# Patient Record
Sex: Female | Born: 1950 | Race: White | Hispanic: No | Smoking: Former smoker
Health system: Southern US, Community
[De-identification: ages and names within clinical notes are randomized; demographics above are authoritative.]

## PROBLEM LIST (undated history)

## (undated) DIAGNOSIS — K5790 Diverticulosis of intestine, part unspecified, without perforation or abscess without bleeding: Secondary | ICD-10-CM

## (undated) DIAGNOSIS — R0989 Other specified symptoms and signs involving the circulatory and respiratory systems: Secondary | ICD-10-CM

## (undated) DIAGNOSIS — I10 Essential (primary) hypertension: Secondary | ICD-10-CM

## (undated) DIAGNOSIS — I479 Paroxysmal tachycardia, unspecified: Secondary | ICD-10-CM

## (undated) DIAGNOSIS — E049 Nontoxic goiter, unspecified: Secondary | ICD-10-CM

## (undated) DIAGNOSIS — E785 Hyperlipidemia, unspecified: Secondary | ICD-10-CM

## (undated) DIAGNOSIS — T7840XA Allergy, unspecified, initial encounter: Secondary | ICD-10-CM

## (undated) HISTORY — DX: Nontoxic goiter, unspecified: E04.9

## (undated) HISTORY — DX: Other specified symptoms and signs involving the circulatory and respiratory systems: R09.89

## (undated) HISTORY — DX: Essential (primary) hypertension: I10

## (undated) HISTORY — DX: Diverticulosis of intestine, part unspecified, without perforation or abscess without bleeding: K57.90

## (undated) HISTORY — DX: Hyperlipidemia, unspecified: E78.5

## (undated) HISTORY — DX: Paroxysmal tachycardia, unspecified: I47.9

## (undated) HISTORY — DX: Allergy, unspecified, initial encounter: T78.40XA

## (undated) HISTORY — PX: COLONOSCOPY: SHX174

---

## 1973-06-25 HISTORY — PX: TONSILLECTOMY AND ADENOIDECTOMY: SHX28

## 1978-06-25 HISTORY — PX: ABDOMINAL HYSTERECTOMY: SHX81

## 1993-06-25 HISTORY — PX: CHOLECYSTECTOMY: SHX55

## 2004-12-22 ENCOUNTER — Ambulatory Visit: Payer: Self-pay | Admitting: *Deleted

## 2005-01-01 ENCOUNTER — Ambulatory Visit: Payer: Self-pay | Admitting: *Deleted

## 2005-01-03 ENCOUNTER — Ambulatory Visit: Payer: Self-pay | Admitting: Gastroenterology

## 2005-02-06 ENCOUNTER — Ambulatory Visit: Payer: Self-pay | Admitting: Gastroenterology

## 2006-07-31 ENCOUNTER — Ambulatory Visit: Payer: Self-pay | Admitting: *Deleted

## 2007-10-16 ENCOUNTER — Ambulatory Visit: Payer: Self-pay | Admitting: *Deleted

## 2009-02-08 ENCOUNTER — Ambulatory Visit: Payer: Self-pay | Admitting: Family Medicine

## 2009-05-10 ENCOUNTER — Encounter: Payer: Self-pay | Admitting: Family Medicine

## 2011-03-16 ENCOUNTER — Ambulatory Visit: Payer: Self-pay | Admitting: Family Medicine

## 2011-04-10 ENCOUNTER — Ambulatory Visit: Payer: Self-pay | Admitting: Family Medicine

## 2012-02-28 ENCOUNTER — Encounter: Payer: Self-pay | Admitting: Internal Medicine

## 2012-06-23 ENCOUNTER — Ambulatory Visit: Payer: Self-pay | Admitting: Family Medicine

## 2012-10-07 ENCOUNTER — Ambulatory Visit: Payer: Self-pay | Admitting: Family Medicine

## 2012-10-14 ENCOUNTER — Ambulatory Visit: Payer: Self-pay | Admitting: Otolaryngology

## 2012-10-16 ENCOUNTER — Encounter: Payer: Self-pay | Admitting: Internal Medicine

## 2012-12-16 ENCOUNTER — Encounter: Payer: Self-pay | Admitting: Internal Medicine

## 2012-12-16 ENCOUNTER — Ambulatory Visit (INDEPENDENT_AMBULATORY_CARE_PROVIDER_SITE_OTHER): Payer: BC Managed Care – PPO | Admitting: Internal Medicine

## 2012-12-16 VITALS — BP 140/100 | HR 73 | Temp 98.4°F | Ht 64.5 in | Wt 157.0 lb

## 2012-12-16 DIAGNOSIS — R221 Localized swelling, mass and lump, neck: Secondary | ICD-10-CM

## 2012-12-16 DIAGNOSIS — I10 Essential (primary) hypertension: Secondary | ICD-10-CM

## 2012-12-16 DIAGNOSIS — R220 Localized swelling, mass and lump, head: Secondary | ICD-10-CM

## 2012-12-16 DIAGNOSIS — E7800 Pure hypercholesterolemia, unspecified: Secondary | ICD-10-CM

## 2012-12-16 DIAGNOSIS — R55 Syncope and collapse: Secondary | ICD-10-CM

## 2012-12-16 MED ORDER — AMLODIPINE BESYLATE 5 MG PO TABS: 5.0000 mg | ORAL_TABLET | Freq: Every day | ORAL | Status: DC

## 2012-12-21 ENCOUNTER — Encounter: Payer: Self-pay | Admitting: Internal Medicine

## 2012-12-21 DIAGNOSIS — E049 Nontoxic goiter, unspecified: Secondary | ICD-10-CM | POA: Insufficient documentation

## 2012-12-21 DIAGNOSIS — R55 Syncope and collapse: Secondary | ICD-10-CM | POA: Insufficient documentation

## 2012-12-21 DIAGNOSIS — I10 Essential (primary) hypertension: Secondary | ICD-10-CM | POA: Insufficient documentation

## 2012-12-21 DIAGNOSIS — E7800 Pure hypercholesterolemia, unspecified: Secondary | ICD-10-CM | POA: Insufficient documentation

## 2012-12-21 NOTE — Assessment & Plan Note (Signed)
Followed by ENT and recently evaluated by Dr Solum.  Biopsy revealed a benign colloid nodule.  Follow.    

## 2012-12-21 NOTE — Assessment & Plan Note (Signed)
Off simvastatin.  Low cholesterol diet and exercise.  Follow lipid panel and liver function.   

## 2012-12-21 NOTE — Progress Notes (Signed)
  Subjective:    Patient ID: Cheyenne Baker, female    DOB: 12/10/1950, 62 y.o.   MRN: [id number]  HPI 62 year old female with past history of hypertension and hypercholesterolemia who comes in today to follow up on these issues as well as to establish care.  She has been followed at [facility] and by Dr Dear.  Blood pressure has been elevated.  Blood pressure on outside checks averaging 145-153/70-80s.  States that two months ago she passed out.  [place] Seat and hit her head.  Had CT scan and was apparently worked up and evaluated.  Was found to have a neck nodule.  Has seen Dr Gertie Baron and Dr Tedd Sias.  Biopsy revealed a benign colloid nodule.  Has no trouble swallowing.  Is planning to f/u with Dr Chestine Spore for a f/u ultrasound.  Has had some issues with hot flashes.     Past Medical History  Diagnosis Date  . Hypertension   . Hyperlipidemia     Outpatient Encounter Prescriptions as of 12/16/2012  Medication Sig Dispense Refill  . aspirin EC 81 MG tablet Take 81 mg by mouth daily.      Marland Kitchen atenolol (TENORMIN) 50 MG tablet Take 50 mg by mouth daily.      . Calcium Carbonate-Vitamin D (CALCIUM 600+D3) 600-400 MG-UNIT per tablet Take 1 tablet by mouth 2 (two) times daily.      . cetirizine (ZYRTEC) 10 MG tablet Take 10 mg by mouth daily.      Marland Kitchen losartan-hydrochlorothiazide (HYZAAR) 100-12.5 MG per tablet Take 1 tablet by mouth daily.      . [DISCONTINUED] NIFEdipine (PROCARDIA-XL/ADALAT CC) 30 MG 24 hr tablet Take 30 mg by mouth daily.      . [DISCONTINUED] simvastatin (ZOCOR) 40 MG tablet Take 40 mg by mouth every evening.      Marland Kitchen amLODipine (NORVASC) 5 MG tablet Take 1 tablet (5 mg total) by mouth daily.  30 tablet  3   No facility-administered encounter medications on file as of 12/16/2012.    Review of Systems Patient denies any headache, lightheadedness or dizziness.  No sinus or allergy symptoms.  No chest pain, tightness or palpitations.  No increased shortness of breath, cough or congestion.   No nausea or vomiting.  No acid reflux.  No abdominal pain or cramping.  No bowel change, such as diarrhea, constipation, BRBPR or melana.  No urine change.  Some hot flashes as outlined.       Objective:   Physical Exam Filed Vitals:   12/16/12 1519  BP: 140/100  Pulse: 73  Temp: 98.4 F (52.85 C)   62 year old female in no acute distress.   HEENT:  Nares- clear.  Oropharynx - without lesions. NECK:  Supple.  Nontender.  No audible bruit.  HEART:  Appears to be regular. LUNGS:  No crackles or wheezing audible.  Respirations even and unlabored.  RADIAL PULSE:  Equal bilaterally.  ABDOMEN:  Soft, nontender.  Bowel sounds present and normal.  No audible abdominal bruit.   EXTREMITIES:  No increased edema present.  DP pulses palpable and equal bilaterally.          Assessment & Plan:  HEALTH MAINTENANCE.  Schedule her for a physical when due.  Obtain records from outside for review.  Mammogram 2014 - ok.  Colonoscopy 02/06/05.  Recommended f/u seven years (per note).  Pt thought 10 year f/u recommended.

## 2012-12-21 NOTE — Assessment & Plan Note (Signed)
Previously worked up. Obtain records for review.  No reoccurrence.  Follow.

## 2012-12-21 NOTE — Assessment & Plan Note (Signed)
Blood pressure elevated as outlined.  Start norvasc 5mg  q day.  Follow pressures.  Get her back in soon to reassess. Obtain recent lab results.

## 2012-12-29 ENCOUNTER — Encounter: Payer: Self-pay | Admitting: Internal Medicine

## 2013-01-05 ENCOUNTER — Encounter: Payer: Self-pay | Admitting: Internal Medicine

## 2013-01-15 ENCOUNTER — Encounter: Payer: Self-pay | Admitting: Internal Medicine

## 2013-01-16 ENCOUNTER — Ambulatory Visit: Payer: BC Managed Care – PPO | Admitting: Internal Medicine

## 2013-02-02 ENCOUNTER — Encounter: Payer: Self-pay | Admitting: Family Medicine

## 2013-02-05 ENCOUNTER — Encounter: Payer: Self-pay | Admitting: Internal Medicine

## 2013-02-05 ENCOUNTER — Ambulatory Visit (INDEPENDENT_AMBULATORY_CARE_PROVIDER_SITE_OTHER): Payer: BC Managed Care – PPO | Admitting: Internal Medicine

## 2013-02-05 VITALS — BP 124/70 | HR 67 | Temp 98.0°F | Ht 64.5 in | Wt 153.0 lb

## 2013-02-05 DIAGNOSIS — R221 Localized swelling, mass and lump, neck: Secondary | ICD-10-CM

## 2013-02-05 DIAGNOSIS — I10 Essential (primary) hypertension: Secondary | ICD-10-CM

## 2013-02-05 DIAGNOSIS — R55 Syncope and collapse: Secondary | ICD-10-CM

## 2013-02-05 DIAGNOSIS — R220 Localized swelling, mass and lump, head: Secondary | ICD-10-CM

## 2013-02-05 DIAGNOSIS — E7800 Pure hypercholesterolemia, unspecified: Secondary | ICD-10-CM

## 2013-02-05 LAB — TSH: TSH: 1.46 u[IU]/mL (ref 0.35–5.50)

## 2013-02-05 LAB — COMPREHENSIVE METABOLIC PANEL
AST: 25 U/L (ref 0–37)
Alkaline Phosphatase: 41 U/L (ref 39–117)
Glucose, Bld: 106 mg/dL — ABNORMAL HIGH (ref 70–99)
Sodium: 140 mEq/L (ref 135–145)
Total Bilirubin: 0.6 mg/dL (ref 0.3–1.2)
Total Protein: 7.8 g/dL (ref 6.0–8.3)

## 2013-02-05 LAB — CBC WITH DIFFERENTIAL/PLATELET
Basophils Absolute: 0 10*3/uL (ref 0.0–0.1)
Eosinophils Absolute: 0.1 10*3/uL (ref 0.0–0.7)
HCT: 39.9 % (ref 36.0–46.0)
Lymphs Abs: 1.7 10*3/uL (ref 0.7–4.0)
MCHC: 34.4 g/dL (ref 30.0–36.0)
MCV: 91.7 fl (ref 78.0–100.0)
Monocytes Absolute: 0.4 10*3/uL (ref 0.1–1.0)
Platelets: 304 10*3/uL (ref 150.0–400.0)
RDW: 12.9 % (ref 11.5–14.6)

## 2013-02-05 LAB — LIPID PANEL
Cholesterol: 246 mg/dL — ABNORMAL HIGH (ref 0–200)
Triglycerides: 154 mg/dL — ABNORMAL HIGH (ref 0.0–149.0)

## 2013-02-06 ENCOUNTER — Encounter: Payer: Self-pay | Admitting: *Deleted

## 2013-02-06 ENCOUNTER — Other Ambulatory Visit: Payer: Self-pay | Admitting: Internal Medicine

## 2013-02-06 DIAGNOSIS — E876 Hypokalemia: Secondary | ICD-10-CM

## 2013-02-06 NOTE — Progress Notes (Signed)
Order placed for f/u potassium.  

## 2013-02-07 ENCOUNTER — Encounter: Payer: Self-pay | Admitting: Internal Medicine

## 2013-02-07 NOTE — Assessment & Plan Note (Signed)
Off simvastatin.  Low cholesterol diet and exercise.  Follow lipid panel and liver function.   

## 2013-02-07 NOTE — Assessment & Plan Note (Signed)
Previously worked up. Need records for review.  No reoccurrence.  Follow.

## 2013-02-07 NOTE — Assessment & Plan Note (Signed)
Followed by ENT and recently evaluated by Dr Tedd Sias.  Biopsy revealed a benign colloid nodule.  Follow.

## 2013-02-07 NOTE — Assessment & Plan Note (Signed)
Started norvasc 5mg  q day last visit.   Pressures much improved.    Follow metabolic panel.

## 2013-02-07 NOTE — Progress Notes (Signed)
  Subjective:    Patient ID: Cheyenne Baker, female    DOB: [birth year], 62 y.o.   MRN: [id number]  HPI 62 year old female with past history of hypertension and hypercholesterolemia who comes in today for a scheduled follow up.  Blood pressure had been elevated.   Also was previously found to have a neck nodule.  See last note for details.  Has seen Dr Gertie Baron and Dr Tedd Sias.  Biopsy revealed a benign colloid nodule.  Has no trouble swallowing.  Was started on norvasc last visit.  Tolerating.  Blood pressures on outside checks averaging 118-130s/60-70s.  Occasional 140s systolic readings.  Overall she feels good and feels she is doing well.       Past Medical History  Diagnosis Date  . Hypertension   . Hyperlipidemia     Outpatient Encounter Prescriptions as of 02/05/2013  Medication Sig Dispense Refill  . amLODipine (NORVASC) 5 MG tablet Take 1 tablet (5 mg total) by mouth daily.  30 tablet  3  . aspirin EC 81 MG tablet Take 81 mg by mouth daily.      Marland Kitchen atenolol (TENORMIN) 50 MG tablet Take 50 mg by mouth daily.      . Calcium Carbonate-Vitamin D (CALCIUM 600+D3) 600-400 MG-UNIT per tablet Take 1 tablet by mouth 2 (two) times daily.      . cetirizine (ZYRTEC) 10 MG tablet Take 10 mg by mouth daily.      Marland Kitchen losartan-hydrochlorothiazide (HYZAAR) 100-12.5 MG per tablet Take 1 tablet by mouth daily.       No facility-administered encounter medications on file as of 02/05/2013.    Review of Systems Patient denies any headache, lightheadedness or dizziness.  No sinus or allergy symptoms.  No chest pain, tightness or palpitations.  No increased shortness of breath, cough or congestion.  No nausea or vomiting.  No acid reflux.  No abdominal pain or cramping.  No bowel change, such as diarrhea, constipation, BRBPR or melana.  No urine change.  Blood pressures as outlined.      Objective:   Physical Exam  Filed Vitals:   02/05/13 1015  BP: 124/70  Pulse: 67  Temp: 98 F (36.7 C)   Blood  pressure recheck:  134-136/78, pulse 63  62 year old female in no acute distress.   HEENT:  Nares- clear.  Oropharynx - without lesions. NECK:  Supple.  Nontender.  No audible bruit.  HEART:  Appears to be regular. LUNGS:  No crackles or wheezing audible.  Respirations even and unlabored.  RADIAL PULSE:  Equal bilaterally.  ABDOMEN:  Soft, nontender.  Bowel sounds present and normal.  No audible abdominal bruit.   EXTREMITIES:  No increased edema present.  DP pulses palpable and equal bilaterally.          Assessment & Plan:  HEALTH MAINTENANCE.  Schedule her for a physical when due.  Mammogram 2014 - ok.  Colonoscopy 02/06/05.  Recommended f/u seven years (per note).  Pt thought 10 year f/u recommended.

## 2013-02-08 ENCOUNTER — Encounter: Payer: Self-pay | Admitting: Internal Medicine

## 2013-02-09 ENCOUNTER — Encounter: Payer: Self-pay | Admitting: Internal Medicine

## 2013-02-09 ENCOUNTER — Other Ambulatory Visit: Payer: Self-pay | Admitting: *Deleted

## 2013-02-09 MED ORDER — PRAVASTATIN SODIUM 10 MG PO TABS: 10.0000 mg | ORAL_TABLET | Freq: Every day | ORAL | Status: DC

## 2013-02-16 ENCOUNTER — Other Ambulatory Visit (INDEPENDENT_AMBULATORY_CARE_PROVIDER_SITE_OTHER): Payer: BC Managed Care – PPO

## 2013-02-16 DIAGNOSIS — E876 Hypokalemia: Secondary | ICD-10-CM

## 2013-02-17 ENCOUNTER — Encounter: Payer: Self-pay | Admitting: Internal Medicine

## 2013-02-17 ENCOUNTER — Telehealth: Payer: Self-pay | Admitting: Internal Medicine

## 2013-02-17 DIAGNOSIS — N289 Disorder of kidney and ureter, unspecified: Secondary | ICD-10-CM

## 2013-02-17 NOTE — Telephone Encounter (Signed)
Pt notified of lab results vai my chart.  Needs a non fasting lab in 10 days.  Please schedule and notify pt of appt date and time.  Thanks.

## 2013-02-17 NOTE — Telephone Encounter (Signed)
Left message on cell phone asking pt to call office °

## 2013-02-20 NOTE — Telephone Encounter (Signed)
Cheyenne Baker made appointment 9/5

## 2013-02-27 ENCOUNTER — Other Ambulatory Visit (INDEPENDENT_AMBULATORY_CARE_PROVIDER_SITE_OTHER): Payer: BC Managed Care – PPO

## 2013-02-27 DIAGNOSIS — N289 Disorder of kidney and ureter, unspecified: Secondary | ICD-10-CM

## 2013-02-27 LAB — BASIC METABOLIC PANEL
BUN: 19 mg/dL (ref 6–23)
Calcium: 10.5 mg/dL (ref 8.4–10.5)
Creatinine, Ser: 1.2 mg/dL (ref 0.4–1.2)
GFR: 49.91 mL/min — ABNORMAL LOW (ref >60.00)
Potassium: 3.7 mEq/L (ref 3.5–5.1)

## 2013-02-28 ENCOUNTER — Encounter: Payer: Self-pay | Admitting: Internal Medicine

## 2013-03-03 ENCOUNTER — Encounter: Payer: Self-pay | Admitting: Internal Medicine

## 2013-03-16 ENCOUNTER — Encounter: Payer: Self-pay | Admitting: Internal Medicine

## 2013-03-21 ENCOUNTER — Encounter: Payer: Self-pay | Admitting: Internal Medicine

## 2013-03-23 ENCOUNTER — Other Ambulatory Visit (INDEPENDENT_AMBULATORY_CARE_PROVIDER_SITE_OTHER): Payer: BC Managed Care – PPO

## 2013-03-23 ENCOUNTER — Other Ambulatory Visit: Payer: Self-pay | Admitting: Internal Medicine

## 2013-03-23 ENCOUNTER — Encounter: Payer: Self-pay | Admitting: Internal Medicine

## 2013-03-23 ENCOUNTER — Telehealth: Payer: Self-pay | Admitting: *Deleted

## 2013-03-23 DIAGNOSIS — Z1211 Encounter for screening for malignant neoplasm of colon: Secondary | ICD-10-CM

## 2013-03-23 DIAGNOSIS — E7800 Pure hypercholesterolemia, unspecified: Secondary | ICD-10-CM

## 2013-03-23 DIAGNOSIS — N289 Disorder of kidney and ureter, unspecified: Secondary | ICD-10-CM

## 2013-03-23 LAB — HEPATIC FUNCTION PANEL: Total Bilirubin: 0.5 mg/dL (ref 0.3–1.2)

## 2013-03-23 LAB — BASIC METABOLIC PANEL
CO2: 32 mEq/L (ref 19–32)
Chloride: 103 mEq/L (ref 96–112)
Glucose, Bld: 104 mg/dL — ABNORMAL HIGH (ref 70–99)
Potassium: 4.1 mEq/L (ref 3.5–5.1)
Sodium: 139 mEq/L (ref 135–145)

## 2013-03-23 NOTE — Telephone Encounter (Signed)
What labs and dx?  

## 2013-03-23 NOTE — Telephone Encounter (Signed)
Orders placed for labs

## 2013-03-23 NOTE — Progress Notes (Signed)
Order placed for IFOB 

## 2013-04-17 ENCOUNTER — Other Ambulatory Visit (INDEPENDENT_AMBULATORY_CARE_PROVIDER_SITE_OTHER): Payer: BC Managed Care – PPO

## 2013-04-17 ENCOUNTER — Other Ambulatory Visit: Payer: Self-pay | Admitting: *Deleted

## 2013-04-17 DIAGNOSIS — Z1211 Encounter for screening for malignant neoplasm of colon: Secondary | ICD-10-CM

## 2013-04-17 MED ORDER — AMLODIPINE BESYLATE 5 MG PO TABS: 5.0000 mg | ORAL_TABLET | Freq: Every day | ORAL | Status: DC

## 2013-04-20 ENCOUNTER — Encounter: Payer: Self-pay | Admitting: Internal Medicine

## 2013-04-30 ENCOUNTER — Other Ambulatory Visit: Payer: Self-pay

## 2013-05-16 ENCOUNTER — Encounter: Payer: Self-pay | Admitting: Internal Medicine

## 2013-06-04 ENCOUNTER — Encounter: Payer: Self-pay | Admitting: Internal Medicine

## 2013-06-11 ENCOUNTER — Ambulatory Visit (INDEPENDENT_AMBULATORY_CARE_PROVIDER_SITE_OTHER): Payer: BC Managed Care – PPO | Admitting: Internal Medicine

## 2013-06-11 ENCOUNTER — Encounter: Payer: Self-pay | Admitting: Internal Medicine

## 2013-06-11 ENCOUNTER — Other Ambulatory Visit: Payer: Self-pay | Admitting: Internal Medicine

## 2013-06-11 VITALS — BP 130/80 | HR 70 | Temp 97.6°F | Ht 64.0 in | Wt 158.5 lb

## 2013-06-11 DIAGNOSIS — J069 Acute upper respiratory infection, unspecified: Secondary | ICD-10-CM

## 2013-06-11 DIAGNOSIS — R55 Syncope and collapse: Secondary | ICD-10-CM

## 2013-06-11 DIAGNOSIS — R220 Localized swelling, mass and lump, head: Secondary | ICD-10-CM

## 2013-06-11 DIAGNOSIS — I10 Essential (primary) hypertension: Secondary | ICD-10-CM

## 2013-06-11 DIAGNOSIS — E7800 Pure hypercholesterolemia, unspecified: Secondary | ICD-10-CM

## 2013-06-11 DIAGNOSIS — R221 Localized swelling, mass and lump, neck: Secondary | ICD-10-CM

## 2013-06-11 LAB — BASIC METABOLIC PANEL
CO2: 30 mEq/L (ref 19–32)
Calcium: 10.1 mg/dL (ref 8.4–10.5)
Creatinine, Ser: 1 mg/dL (ref 0.4–1.2)
GFR: 58.5 mL/min — ABNORMAL LOW (ref >60.00)
Glucose, Bld: 94 mg/dL (ref 70–99)
Sodium: 140 mEq/L (ref 135–145)

## 2013-06-11 LAB — LIPID PANEL
Cholesterol: 201 mg/dL — ABNORMAL HIGH (ref 0–200)
HDL: 46.1 mg/dL (ref >39.00)
Total CHOL/HDL Ratio: 4
Triglycerides: 144 mg/dL (ref 0.0–149.0)
VLDL: 28.8 mg/dL (ref 0.0–40.0)

## 2013-06-11 LAB — HEPATIC FUNCTION PANEL
ALT: 31 U/L (ref 0–35)
AST: 25 U/L (ref 0–37)
Alkaline Phosphatase: 45 U/L (ref 39–117)
Total Bilirubin: 0.5 mg/dL (ref 0.3–1.2)

## 2013-06-11 LAB — LDL CHOLESTEROL, DIRECT: Direct LDL: 143.3 mg/dL

## 2013-06-11 NOTE — Progress Notes (Signed)
  Subjective:    Patient ID: Cheyenne Baker, female    DOB: [birth year], 62 y.o.   MRN: [id number]  HPI 62 year old female with past history of hypertension and hypercholesterolemia who comes in today to follow up on these issues as well as for a complete physical exam.   Blood pressure had been elevated.   Also was previously found to have a neck nodule.  See previous notes for details.  Has seen Dr Gertie Baron and Dr Tedd Sias.  Biopsy revealed a benign colloid nodule.  She feels the thyroid is "getting bigger".  Notices more fullness.  No pain.  Was started on norvasc at a previous visit - for her blood pressure.  Tolerating.  Blood pressures on outside checks averaging around 130 systolic range.  Doing better.  Overall she feels good and feels she has been doing well.  She has noticed (starting a week ago) some head congestion and cough.  Taking some otc cold medicine and this is helping.  No sob.      Past Medical History  Diagnosis Date  . Hypertension   . Hyperlipidemia     Outpatient Encounter Prescriptions as of 06/11/2013  Medication Sig  . amLODipine (NORVASC) 5 MG tablet Take 1 tablet (5 mg total) by mouth daily.  Marland Kitchen aspirin EC 81 MG tablet Take 81 mg by mouth daily.  Marland Kitchen atenolol (TENORMIN) 50 MG tablet Take 50 mg by mouth daily.  . Calcium Carbonate-Vitamin D (CALCIUM 600+D3) 600-400 MG-UNIT per tablet Take 1 tablet by mouth 2 (two) times daily.  . cetirizine (ZYRTEC) 10 MG tablet Take 10 mg by mouth daily.  Marland Kitchen losartan-hydrochlorothiazide (HYZAAR) 100-12.5 MG per tablet Take 1 tablet by mouth daily.  . pravastatin (PRAVACHOL) 10 MG tablet Take 1 tablet (10 mg total) by mouth daily.    Review of Systems Patient denies any headache, lightheadedness or dizziness.  Previous sore throat.  Some congestion.  No fever reported.  No chest pain, tightness or palpitations.  No increased shortness of breath.  Some cough.  No nausea or vomiting.  No acid reflux.  No abdominal pain or cramping.  No  bowel change, such as diarrhea, constipation, BRBPR or melana.  No urine change.  Blood pressures better.  Does report some increased neck fullness.       Objective:   Physical Exam  Filed Vitals:   06/11/13 1051  BP: 130/80  Pulse: 70  Temp: 97.6 F (36.4 C)   Blood pressure recheck:  138/78, pulse 60  62 year old female in no acute distress.   HEENT:  Nares- clear.  Oropharynx - without lesions. NECK:  Supple.  Nontender.  No audible bruit.  HEART:  Appears to be regular. LUNGS:  No crackles or wheezing audible.  Respirations even and unlabored.  RADIAL PULSE:  Equal bilaterally.    BREASTS:  No nipple discharge or nipple retraction present.  Could not appreciate any distinct nodules or axillary adenopathy.  ABDOMEN:  Soft, nontender.  Bowel sounds present and normal.  No audible abdominal bruit.  GU:  Not performed.     EXTREMITIES:  No increased edema present.  DP pulses palpable and equal bilaterally.          Assessment & Plan:  HEALTH MAINTENANCE.  Physical today.   Mammogram 06/23/12 Birads I.   Colonoscopy 02/06/05.  Recommended f/u seven years (per note).  Pt thought 10 year f/u recommended.  IFOB negative 04/17/13.

## 2013-06-11 NOTE — Progress Notes (Signed)
Pre-visit discussion using our clinic review tool. No additional management support is needed unless otherwise documented below in the visit note.  

## 2013-06-11 NOTE — Progress Notes (Signed)
Order placed for labs.

## 2013-06-12 ENCOUNTER — Telehealth: Payer: Self-pay | Admitting: Internal Medicine

## 2013-06-12 ENCOUNTER — Other Ambulatory Visit (INDEPENDENT_AMBULATORY_CARE_PROVIDER_SITE_OTHER): Payer: BC Managed Care – PPO

## 2013-06-12 ENCOUNTER — Encounter: Payer: Self-pay | Admitting: Internal Medicine

## 2013-06-12 ENCOUNTER — Other Ambulatory Visit: Payer: Self-pay | Admitting: Internal Medicine

## 2013-06-12 DIAGNOSIS — E041 Nontoxic single thyroid nodule: Secondary | ICD-10-CM

## 2013-06-12 DIAGNOSIS — J069 Acute upper respiratory infection, unspecified: Secondary | ICD-10-CM | POA: Insufficient documentation

## 2013-06-12 LAB — T4, FREE: Free T4: 0.73 ng/dL (ref 0.60–1.60)

## 2013-06-12 LAB — TSH: TSH: 1.66 u[IU]/mL (ref 0.35–5.50)

## 2013-06-12 MED ORDER — CEFUROXIME AXETIL 250 MG PO TABS: 250.0000 mg | ORAL_TABLET | Freq: Two times a day (BID) | ORAL | Status: DC

## 2013-06-12 MED ORDER — PRAVASTATIN SODIUM 20 MG PO TABS: 20.0000 mg | ORAL_TABLET | Freq: Every day | ORAL | Status: DC

## 2013-06-12 MED ORDER — FLUTICASONE PROPIONATE 50 MCG/ACT NA SUSP: 2.0000 | Freq: Every day | NASAL | Status: DC

## 2013-06-12 NOTE — Assessment & Plan Note (Signed)
On pravastatin.  Low cholesterol diet and exercise.  Follow lipid panel and liver function.  Recheck today.

## 2013-06-12 NOTE — Assessment & Plan Note (Signed)
Pressures much improved.    Follow metabolic panel.  Follow pressures.   

## 2013-06-12 NOTE — Progress Notes (Signed)
Orders placed for f/u thyroid labs.

## 2013-06-12 NOTE — Telephone Encounter (Signed)
Called pt.  Symptoms progressing.  Chest congestion.  Throat congestion.  Increased drainage.  Will treat with saline nasal spray - flush nose at least 2-3x/day.  Flonase nasal spray as directed.  Robitussin as directd.  Sent in rx for ceftin if symptoms progressed.  She will not take unless needed.  Will call if persistent problems.

## 2013-06-12 NOTE — Telephone Encounter (Signed)
Pt states she left msg on vm this morning.  States she was told at her appt yesterday Dr. Lorin Picket could call something in for her if she were feeling worse today. Is still coughing, feels it is settling in bottom of throat and into chest, headache.  States she would like to have something called in.  Cheyenne Baker.

## 2013-06-12 NOTE — Telephone Encounter (Signed)
Attempted to reach pt to discuss symptoms, had to leave message. Forwarded on to dr. Lorin Picket

## 2013-06-12 NOTE — Assessment & Plan Note (Signed)
Probably viral.  Symptoms improved with otc cold medicine.  Follow.

## 2013-06-12 NOTE — Assessment & Plan Note (Signed)
Followed by ENT and recently evaluated by Dr Tedd Sias.  Biopsy revealed a benign colloid nodule.  Now with increased fullness.  More symptomatic.  Will refer back to Dr Tedd Sias for reevaluation.

## 2013-06-12 NOTE — Assessment & Plan Note (Signed)
Previously worked up.  No reoccurrence.  Follow.   

## 2013-06-12 NOTE — Progress Notes (Signed)
Pravastatin rx sent in.

## 2013-06-13 ENCOUNTER — Encounter: Payer: Self-pay | Admitting: Internal Medicine

## 2013-07-16 ENCOUNTER — Other Ambulatory Visit: Payer: Self-pay | Admitting: Internal Medicine

## 2013-07-17 ENCOUNTER — Other Ambulatory Visit: Payer: Self-pay | Admitting: *Deleted

## 2013-07-17 MED ORDER — LOSARTAN POTASSIUM-HCTZ 100-12.5 MG PO TABS: 1.0000 | ORAL_TABLET | Freq: Every day | ORAL | Status: DC

## 2013-07-18 ENCOUNTER — Encounter: Payer: Self-pay | Admitting: Internal Medicine

## 2013-07-20 ENCOUNTER — Other Ambulatory Visit: Payer: Self-pay | Admitting: *Deleted

## 2013-07-20 MED ORDER — AMLODIPINE BESYLATE 5 MG PO TABS: ORAL_TABLET | ORAL | Status: DC

## 2013-07-20 MED ORDER — LOSARTAN POTASSIUM-HCTZ 100-12.5 MG PO TABS: 1.0000 | ORAL_TABLET | Freq: Every day | ORAL | Status: DC

## 2013-07-22 ENCOUNTER — Other Ambulatory Visit: Payer: Self-pay | Admitting: *Deleted

## 2013-07-23 ENCOUNTER — Ambulatory Visit: Payer: Self-pay | Admitting: Internal Medicine

## 2013-07-23 LAB — HM MAMMOGRAPHY: HM Mammogram: NEGATIVE

## 2013-07-24 ENCOUNTER — Other Ambulatory Visit: Payer: Self-pay | Admitting: *Deleted

## 2013-07-24 ENCOUNTER — Encounter: Payer: Self-pay | Admitting: Internal Medicine

## 2013-08-14 ENCOUNTER — Encounter: Payer: Self-pay | Admitting: *Deleted

## 2013-09-04 ENCOUNTER — Other Ambulatory Visit: Payer: Self-pay | Admitting: *Deleted

## 2013-09-04 MED ORDER — ATENOLOL 50 MG PO TABS: 50.0000 mg | ORAL_TABLET | Freq: Every day | ORAL | Status: DC

## 2013-09-22 ENCOUNTER — Telehealth: Payer: Self-pay | Admitting: Internal Medicine

## 2013-09-22 ENCOUNTER — Other Ambulatory Visit: Payer: Self-pay | Admitting: *Deleted

## 2013-09-22 MED ORDER — ATENOLOL 50 MG PO TABS: 50.0000 mg | ORAL_TABLET | Freq: Every day | ORAL | Status: DC

## 2013-09-22 NOTE — Telephone Encounter (Signed)
Pharmacy Note  Pt would like a 90 day supply

## 2013-09-22 NOTE — Telephone Encounter (Signed)
Pt calling to ask that Dr. Nicki Reaper let her pharmacy know that her atenolol needs to be 90 day supply.  States all her meds are 90 day supplies except this one and it needs to be 90 days.  States it is cheaper with her insurance if she does 90 day supply.

## 2013-09-22 NOTE — Telephone Encounter (Signed)
90 day supply already sent, see telephone encounter.

## 2013-09-22 NOTE — Telephone Encounter (Signed)
Resent Atenolol as 90 day supply.

## 2013-10-15 ENCOUNTER — Other Ambulatory Visit: Payer: Self-pay | Admitting: Internal Medicine

## 2013-10-15 ENCOUNTER — Ambulatory Visit (INDEPENDENT_AMBULATORY_CARE_PROVIDER_SITE_OTHER): Payer: Medicare Other | Admitting: Internal Medicine

## 2013-10-15 ENCOUNTER — Encounter: Payer: Self-pay | Admitting: Internal Medicine

## 2013-10-15 VITALS — BP 130/90 | HR 73 | Temp 98.1°F | Ht 64.0 in | Wt 159.5 lb

## 2013-10-15 DIAGNOSIS — E7800 Pure hypercholesterolemia, unspecified: Secondary | ICD-10-CM

## 2013-10-15 DIAGNOSIS — R220 Localized swelling, mass and lump, head: Secondary | ICD-10-CM

## 2013-10-15 DIAGNOSIS — R221 Localized swelling, mass and lump, neck: Secondary | ICD-10-CM

## 2013-10-15 DIAGNOSIS — R55 Syncope and collapse: Secondary | ICD-10-CM

## 2013-10-15 DIAGNOSIS — I10 Essential (primary) hypertension: Secondary | ICD-10-CM

## 2013-10-15 LAB — HEPATIC FUNCTION PANEL
ALBUMIN: 4 g/dL (ref 3.5–5.2)
ALK PHOS: 47 U/L (ref 39–117)
ALT: 31 U/L (ref 0–35)
AST: 28 U/L (ref 0–37)
BILIRUBIN TOTAL: 0.7 mg/dL (ref 0.3–1.2)
Bilirubin, Direct: 0.1 mg/dL (ref 0.0–0.3)
Total Protein: 7.2 g/dL (ref 6.0–8.3)

## 2013-10-15 LAB — LIPID PANEL
Cholesterol: 190 mg/dL (ref 0–200)
HDL: 52.3 mg/dL (ref >39.00)
LDL CALC: 117 mg/dL — AB (ref 0–99)
TRIGLYCERIDES: 104 mg/dL (ref 0.0–149.0)
Total CHOL/HDL Ratio: 4
VLDL: 20.8 mg/dL (ref 0.0–40.0)

## 2013-10-15 LAB — BASIC METABOLIC PANEL
BUN: 16 mg/dL (ref 6–23)
CO2: 30 mEq/L (ref 19–32)
Calcium: 11.2 mg/dL — ABNORMAL HIGH (ref 8.4–10.5)
Chloride: 105 mEq/L (ref 96–112)
Creatinine, Ser: 1 mg/dL (ref 0.4–1.2)
GFR: 61.23 mL/min (ref >60.00)
GLUCOSE: 101 mg/dL — AB (ref 70–99)
Potassium: 3.9 mEq/L (ref 3.5–5.1)
Sodium: 142 mEq/L (ref 135–145)

## 2013-10-15 NOTE — Assessment & Plan Note (Signed)
Pressures much improved.    Follow metabolic panel.  Follow pressures.

## 2013-10-15 NOTE — Assessment & Plan Note (Addendum)
Followed by ENT and recently evaluated by Dr Gabriel Carina.  Biopsy revealed a benign colloid nodule.  States has been released.  Feels things are stable.  Follow.

## 2013-10-15 NOTE — Progress Notes (Signed)
Pre visit review using our clinic review tool, if applicable. No additional management support is needed unless otherwise documented below in the visit note. 

## 2013-10-15 NOTE — Progress Notes (Signed)
  Subjective:    Patient ID: Cheyenne Baker, female    DOB: August 03, 1950, 63 y.o.   MRN: [id number]  HPI 63 year old female with past history of hypertension and hypercholesterolemia who comes in today for a scheduled follow up.  Blood pressure had been elevated.   Also was previously found to have a neck nodule.  See previous notes for details.  Has seen Dr Nadeen Landau and Dr Gabriel Carina.  Biopsy revealed a benign colloid nodule.  She feels the thyroid is stable now.  No change.  No pain.  Was started on norvasc at a previous visit - for her blood pressure.  Tolerating.  Blood pressures on outside checks averaging around 130/80.   Doing better.  Overall she feels good and feels she has been doing well.      Past Medical History  Diagnosis Date  . Hypertension   . Hyperlipidemia     Outpatient Encounter Prescriptions as of 10/15/2013  Medication Sig  . amLODipine (NORVASC) 5 MG tablet TAKE ONE (1) TABLET EACH DAY  . aspirin EC 81 MG tablet Take 81 mg by mouth daily.  Marland Kitchen atenolol (TENORMIN) 50 MG tablet Take 1 tablet (50 mg total) by mouth daily.  . Calcium Carbonate-Vitamin D (CALCIUM 600+D3) 600-400 MG-UNIT per tablet Take 1 tablet by mouth 2 (two) times daily.  . cetirizine (ZYRTEC) 10 MG tablet Take 10 mg by mouth daily.  Marland Kitchen losartan-hydrochlorothiazide (HYZAAR) 100-12.5 MG per tablet Take 1 tablet by mouth daily.  . pravastatin (PRAVACHOL) 20 MG tablet Take 1 tablet (20 mg total) by mouth daily.  . [DISCONTINUED] cefUROXime (CEFTIN) 250 MG tablet Take 1 tablet (250 mg total) by mouth 2 (two) times daily with a meal.  . [DISCONTINUED] fluticasone (FLONASE) 50 MCG/ACT nasal spray Place 2 sprays into both nostrils daily. Use this in the pm    Review of Systems Patient denies any headache, lightheadedness or dizziness.  No increased sinus congestion.   No chest pain, tightness or palpitations.  No increased shortness of breath.  No cough or chest congestion.  No nausea or vomiting.  No acid reflux.   No abdominal pain or cramping.  No bowel change, such as diarrhea, constipation, BRBPR or melana.  No urine change.  Blood pressures better.        Objective:   Physical Exam  Filed Vitals:   10/15/13 0934  BP: 130/90  Pulse: 73  Temp: 98.1 F (36.7 C)   Blood pressure recheck:  13/20  63 year old female in no acute distress.   HEENT:  Nares- clear.  Oropharynx - without lesions. NECK:  Supple.  Nontender.  No audible bruit.  Thyroid fullness noted (more on the right).   HEART:  Appears to be regular. LUNGS:  No crackles or wheezing audible.  Respirations even and unlabored.  RADIAL PULSE:  Equal bilaterally.  ABDOMEN:  Soft, nontender.  Bowel sounds present and normal.  No audible abdominal bruit.    EXTREMITIES:  No increased edema present.  DP pulses palpable and equal bilaterally.          Assessment & Plan:  HEALTH MAINTENANCE.  Physical 06/11/13.   Mammogram 07/14/13 - Birads I.   Colonoscopy 02/06/05.  Recommended f/u seven years (per note).  Pt thought 10 year f/u recommended.  IFOB negative 04/17/13.

## 2013-10-15 NOTE — Progress Notes (Signed)
Orders placed for f/u labs.  

## 2013-10-15 NOTE — Assessment & Plan Note (Signed)
Previously worked up.  No reoccurrence.  Follow.

## 2013-10-16 ENCOUNTER — Other Ambulatory Visit: Payer: Self-pay | Admitting: Internal Medicine

## 2013-10-16 NOTE — Progress Notes (Signed)
Opened in error

## 2013-10-18 ENCOUNTER — Encounter: Payer: Self-pay | Admitting: Internal Medicine

## 2013-10-18 NOTE — Assessment & Plan Note (Signed)
On pravastatin.  Low cholesterol diet and exercise.  Follow lipid panel and liver function.    

## 2013-10-19 ENCOUNTER — Other Ambulatory Visit: Payer: Medicare Other

## 2013-10-20 ENCOUNTER — Other Ambulatory Visit (INDEPENDENT_AMBULATORY_CARE_PROVIDER_SITE_OTHER): Payer: Medicare Other

## 2013-10-20 ENCOUNTER — Encounter: Payer: Self-pay | Admitting: Internal Medicine

## 2013-10-20 ENCOUNTER — Other Ambulatory Visit: Payer: Self-pay | Admitting: Internal Medicine

## 2013-10-20 LAB — CALCIUM: Calcium: 9.8 mg/dL (ref 8.4–10.5)

## 2013-10-21 ENCOUNTER — Encounter: Payer: Self-pay | Admitting: Internal Medicine

## 2013-10-21 ENCOUNTER — Other Ambulatory Visit: Payer: Self-pay | Admitting: Internal Medicine

## 2013-10-21 DIAGNOSIS — M109 Gout, unspecified: Secondary | ICD-10-CM

## 2013-10-21 LAB — PARATHYROID HORMONE, INTACT (NO CA): PTH: 66.8 pg/mL (ref 14.0–72.0)

## 2013-10-21 LAB — URIC ACID: URIC ACID, SERUM: 6 mg/dL (ref 2.4–7.0)

## 2013-10-21 LAB — CALCIUM, IONIZED: CALCIUM ION: 1.28 mmol/L (ref 1.12–1.32)

## 2013-10-21 NOTE — Progress Notes (Signed)
Order placed for uric acid test

## 2013-10-28 ENCOUNTER — Encounter: Payer: Self-pay | Admitting: Internal Medicine

## 2013-12-24 ENCOUNTER — Other Ambulatory Visit: Payer: Self-pay | Admitting: Internal Medicine

## 2014-01-13 ENCOUNTER — Other Ambulatory Visit: Payer: Self-pay | Admitting: Internal Medicine

## 2014-03-30 ENCOUNTER — Other Ambulatory Visit: Payer: Self-pay | Admitting: Internal Medicine

## 2014-07-15 ENCOUNTER — Other Ambulatory Visit: Payer: Self-pay | Admitting: Internal Medicine

## 2014-07-15 NOTE — Telephone Encounter (Signed)
Last OV 4.23.15, last refill 10.26.15.  Please advise refill

## 2014-07-15 NOTE — Telephone Encounter (Signed)
I will refill x 1.  She needs a f/u appt.

## 2014-07-21 ENCOUNTER — Other Ambulatory Visit: Payer: Self-pay | Admitting: *Deleted

## 2014-07-21 ENCOUNTER — Telehealth: Payer: Self-pay | Admitting: Internal Medicine

## 2014-07-21 MED ORDER — AMLODIPINE BESYLATE 5 MG PO TABS: ORAL_TABLET | ORAL | Status: DC

## 2014-07-21 NOTE — Telephone Encounter (Signed)
The soonest I could schedule the patient for her physical is 4.5.16. She will need medication refills before this date .

## 2014-09-28 ENCOUNTER — Ambulatory Visit
Admit: 2014-09-28 | Disposition: A | Discharge status: Home / Self Care | Payer: Self-pay | Attending: Internal Medicine | Admitting: Internal Medicine

## 2014-09-28 ENCOUNTER — Encounter: Payer: Self-pay | Admitting: Internal Medicine

## 2014-09-28 ENCOUNTER — Ambulatory Visit (INDEPENDENT_AMBULATORY_CARE_PROVIDER_SITE_OTHER): Payer: Medicare Other | Admitting: Internal Medicine

## 2014-09-28 VITALS — BP 122/90 | HR 68 | Temp 98.3°F | Ht 65.0 in | Wt 158.2 lb

## 2014-09-28 DIAGNOSIS — E7800 Pure hypercholesterolemia, unspecified: Secondary | ICD-10-CM

## 2014-09-28 DIAGNOSIS — L988 Other specified disorders of the skin and subcutaneous tissue: Secondary | ICD-10-CM

## 2014-09-28 DIAGNOSIS — I10 Essential (primary) hypertension: Secondary | ICD-10-CM

## 2014-09-28 DIAGNOSIS — R221 Localized swelling, mass and lump, neck: Secondary | ICD-10-CM | POA: Diagnosis not present

## 2014-09-28 DIAGNOSIS — Z23 Encounter for immunization: Secondary | ICD-10-CM | POA: Diagnosis not present

## 2014-09-28 DIAGNOSIS — Z1231 Encounter for screening mammogram for malignant neoplasm of breast: Secondary | ICD-10-CM | POA: Diagnosis not present

## 2014-09-28 DIAGNOSIS — Z1211 Encounter for screening for malignant neoplasm of colon: Secondary | ICD-10-CM

## 2014-09-28 DIAGNOSIS — M109 Gout, unspecified: Secondary | ICD-10-CM | POA: Diagnosis not present

## 2014-09-28 DIAGNOSIS — E041 Nontoxic single thyroid nodule: Secondary | ICD-10-CM

## 2014-09-28 DIAGNOSIS — Z0000 Encounter for general adult medical examination without abnormal findings: Secondary | ICD-10-CM

## 2014-09-28 DIAGNOSIS — K219 Gastro-esophageal reflux disease without esophagitis: Secondary | ICD-10-CM

## 2014-09-28 DIAGNOSIS — N649 Disorder of breast, unspecified: Secondary | ICD-10-CM

## 2014-09-28 DIAGNOSIS — Z9109 Other allergy status, other than to drugs and biological substances: Secondary | ICD-10-CM

## 2014-09-28 DIAGNOSIS — Z91048 Other nonmedicinal substance allergy status: Secondary | ICD-10-CM

## 2014-09-28 LAB — BASIC METABOLIC PANEL
BUN: 14 mg/dL (ref 6–23)
CALCIUM: 10.2 mg/dL (ref 8.4–10.5)
CO2: 32 mEq/L (ref 19–32)
CREATININE: 0.89 mg/dL (ref 0.40–1.20)
Chloride: 102 mEq/L (ref 96–112)
GFR: 67.42 mL/min (ref >60.00)
GLUCOSE: 88 mg/dL (ref 70–99)
Potassium: 4 mEq/L (ref 3.5–5.1)
Sodium: 140 mEq/L (ref 135–145)

## 2014-09-28 LAB — T3, FREE: T3 FREE: 3.3 pg/mL (ref 2.3–4.2)

## 2014-09-28 LAB — CBC WITH DIFFERENTIAL/PLATELET
Basophils Absolute: 0 10*3/uL (ref 0.0–0.1)
Basophils Relative: 0.3 % (ref 0.0–3.0)
Eosinophils Absolute: 0.1 10*3/uL (ref 0.0–0.7)
Eosinophils Relative: 1.5 % (ref 0.0–5.0)
HEMATOCRIT: 42.1 % (ref 36.0–46.0)
Hemoglobin: 14.5 g/dL (ref 12.0–15.0)
LYMPHS ABS: 1.9 10*3/uL (ref 0.7–4.0)
Lymphocytes Relative: 19.4 % (ref 12.0–46.0)
MCHC: 34.5 g/dL (ref 30.0–36.0)
MCV: 89.2 fl (ref 78.0–100.0)
MONOS PCT: 6.1 % (ref 3.0–12.0)
Monocytes Absolute: 0.6 10*3/uL (ref 0.1–1.0)
NEUTROS ABS: 7.3 10*3/uL (ref 1.4–7.7)
Neutrophils Relative %: 72.7 % (ref 43.0–77.0)
Platelets: 305 10*3/uL (ref 150.0–400.0)
RBC: 4.73 Mil/uL (ref 3.87–5.11)
RDW: 13.6 % (ref 11.5–15.5)
WBC: 10 10*3/uL (ref 4.0–10.5)

## 2014-09-28 LAB — LIPID PANEL
CHOL/HDL RATIO: 3
Cholesterol: 187 mg/dL (ref 0–200)
HDL: 55.7 mg/dL (ref >39.00)
LDL Cholesterol: 111 mg/dL — ABNORMAL HIGH (ref 0–99)
NonHDL: 131.3
Triglycerides: 103 mg/dL (ref 0.0–149.0)
VLDL: 20.6 mg/dL (ref 0.0–40.0)

## 2014-09-28 LAB — HEPATIC FUNCTION PANEL
ALT: 24 U/L (ref 0–35)
AST: 21 U/L (ref 0–37)
Albumin: 4.4 g/dL (ref 3.5–5.2)
Alkaline Phosphatase: 58 U/L (ref 39–117)
Bilirubin, Direct: 0.1 mg/dL (ref 0.0–0.3)
TOTAL PROTEIN: 7.5 g/dL (ref 6.0–8.3)
Total Bilirubin: 0.7 mg/dL (ref 0.2–1.2)

## 2014-09-28 LAB — TSH: TSH: 1.64 u[IU]/mL (ref 0.35–4.50)

## 2014-09-28 LAB — URIC ACID: URIC ACID, SERUM: 5.5 mg/dL (ref 2.4–7.0)

## 2014-09-28 LAB — HM MAMMOGRAPHY: HM MAMMO: NEGATIVE

## 2014-09-28 LAB — T4, FREE: FREE T4: 0.74 ng/dL (ref 0.60–1.60)

## 2014-09-28 MED ORDER — FEXOFENADINE HCL 180 MG PO TABS: 180.0000 mg | ORAL_TABLET | Freq: Every day | ORAL | Status: DC

## 2014-09-28 MED ORDER — ATENOLOL 50 MG PO TABS
ORAL_TABLET | ORAL | Status: DC
Start: 2014-09-28 — End: 2015-04-13

## 2014-09-28 MED ORDER — AMLODIPINE BESYLATE 5 MG PO TABS: ORAL_TABLET | ORAL | Status: DC

## 2014-09-28 MED ORDER — PRAVASTATIN SODIUM 20 MG PO TABS: ORAL_TABLET | ORAL | Status: DC

## 2014-09-28 MED ORDER — CETIRIZINE HCL 10 MG PO TABS
10.0000 mg | ORAL_TABLET | Freq: Every day | ORAL | Status: DC
Start: 2014-09-28 — End: 2014-09-28

## 2014-09-28 MED ORDER — LOSARTAN POTASSIUM-HCTZ 100-12.5 MG PO TABS: ORAL_TABLET | ORAL | Status: DC

## 2014-09-28 NOTE — Progress Notes (Signed)
Pre visit review using our clinic review tool, if applicable. No additional management support is needed unless otherwise documented below in the visit note. 

## 2014-09-28 NOTE — Progress Notes (Signed)
Patient ID: Cheyenne Baker, female   DOB: August 06, 1950, 64 y.o.   MRN: [id number]   Subjective:    Patient ID: Cheyenne Baker, female    DOB: 03-06-1951, 64 y.o.   MRN: [id number]  HPI  Patient here for her physical exam.  She has noticed some acid reflux recently.  No swallowing problems.  No cardiac symptoms with increased activity or exertion.  Breathing stable.  Some allergy symptoms.  Has been on zyrtec.  Discussed wanting to change to allegra.  She has noticed a persistent left breast lesion.  Bowels stable.  Last colonoscopy 01/2004-2006.  Due f/u colonoscopy.     Past Medical History  Diagnosis Date  . Hypertension   . Hyperlipidemia     Outpatient Encounter Prescriptions as of 09/28/2014  Medication Sig  . amLODipine (NORVASC) 5 MG tablet TAKE ONE (1) TABLET BY MOUTH EVERY DAY  . aspirin EC 81 MG tablet Take 81 mg by mouth daily.  Marland Kitchen atenolol (TENORMIN) 50 MG tablet TAKE ONE (1) TABLET BY MOUTH EVERY DAY  . losartan-hydrochlorothiazide (HYZAAR) 100-12.5 MG per tablet TAKE ONE (1) TABLET EACH DAY  . pravastatin (PRAVACHOL) 20 MG tablet TAKE ONE (1) TABLET EACH DAY  . [DISCONTINUED] amLODipine (NORVASC) 5 MG tablet TAKE ONE (1) TABLET BY MOUTH EVERY DAY  . [DISCONTINUED] atenolol (TENORMIN) 50 MG tablet TAKE ONE (1) TABLET BY MOUTH EVERY DAY  . [DISCONTINUED] Calcium Carbonate-Vitamin D (CALCIUM 600+D3) 600-400 MG-UNIT per tablet Take 1 tablet by mouth 2 (two) times daily.  . [DISCONTINUED] cetirizine (ZYRTEC) 10 MG tablet Take 10 mg by mouth daily.  . [DISCONTINUED] cetirizine (ZYRTEC) 10 MG tablet Take 1 tablet (10 mg total) by mouth daily.  . [DISCONTINUED] losartan-hydrochlorothiazide (HYZAAR) 100-12.5 MG per tablet TAKE ONE (1) TABLET EACH DAY  . [DISCONTINUED] pravastatin (PRAVACHOL) 20 MG tablet TAKE ONE (1) TABLET EACH DAY  . fexofenadine (ALLEGRA ALLERGY) 180 MG tablet Take 1 tablet (180 mg total) by mouth daily.    Review of Systems  Constitutional: Negative for appetite  change and unexpected weight change.  HENT: Negative for congestion and sinus pressure.   Eyes: Negative for pain and visual disturbance.  Respiratory: Negative for cough, chest tightness and shortness of breath.   Cardiovascular: Negative for chest pain, palpitations and leg swelling.  Gastrointestinal: Negative for nausea, vomiting, abdominal pain and diarrhea.       Acid reflux as outlined.    Genitourinary: Negative for dysuria and difficulty urinating.  Musculoskeletal: Negative for back pain and joint swelling.  Skin: Negative for color change and rash.  Neurological: Negative for dizziness, light-headedness and headaches.  Hematological: Negative for adenopathy. Does not bruise/bleed easily.  Psychiatric/Behavioral: Negative for dysphoric mood and agitation.       Objective:     Blood pressure recheck:  142/78-80  Physical Exam  Constitutional: She is oriented to person, place, and time. She appears well-developed and well-nourished.  HENT:  Nose: Nose normal.  Mouth/Throat: Oropharynx is clear and moist.  Eyes: Right eye exhibits no discharge. Left eye exhibits no discharge. No scleral icterus.  Neck: Neck supple. No thyromegaly present.  Cardiovascular: Normal rate and regular rhythm.   Pulmonary/Chest: Breath sounds normal. No accessory muscle usage. No tachypnea. No respiratory distress. She has no decreased breath sounds. She has no wheezes. She has no rhonchi. Right breast exhibits no inverted nipple, no mass, no nipple discharge and no tenderness (no axillary adenopathy). Left breast exhibits no inverted nipple, no mass, no nipple discharge and  no tenderness (no axilarry adenopathy).  Abdominal: Soft. Bowel sounds are normal. There is no tenderness.  Musculoskeletal: She exhibits no edema or tenderness.  Lymphadenopathy:    She has no cervical adenopathy.  Neurological: She is alert and oriented to person, place, and time.  Skin: Skin is warm. No rash noted.    Psychiatric: She has a normal mood and affect. Her behavior is normal.    BP 122/90 mmHg  Pulse 68  Temp(Src) 98.3 F (36.8 C) (Oral)  Ht 5\' 5"  (1.651 m)  Wt 158 lb 4 oz (71.782 kg)  BMI 26.33 kg/m2  SpO2 95% Wt Readings from Last 3 Encounters:  09/28/14 158 lb 4 oz (71.782 kg)  10/15/13 159 lb 8 oz (72.349 kg)  06/11/13 158 lb 8 oz (71.895 kg)     Lab Results  Component Value Date   WBC 10.0 09/28/2014   HGB 14.5 09/28/2014   HCT 42.1 09/28/2014   PLT 305.0 09/28/2014   GLUCOSE 88 09/28/2014   CHOL 187 09/28/2014   TRIG 103.0 09/28/2014   HDL 55.70 09/28/2014   LDLDIRECT 143.3 06/11/2013   LDLCALC 111* 09/28/2014   ALT 24 09/28/2014   AST 21 09/28/2014   NA 140 09/28/2014   K 4.0 09/28/2014   CL 102 09/28/2014   CREATININE 0.89 09/28/2014   BUN 14 09/28/2014   CO2 32 09/28/2014   TSH 1.64 09/28/2014       Assessment & Plan:   Problem List Items Addressed This Visit    Environmental allergies    Will stop zyrtec.  Start allegra as directed.  Follow.       Essential hypertension, benign - Primary    Blood pressure as outlined.  Continue same medication regimen.  Follow pressures.  Get her back in soon to reassess.  Follow metabolic panel.        Relevant Medications   amLODIpine (NORVASC) tablet   atenolol (TENORMIN) tablet   losartan-hydrochlorothiazide (HYZAAR) 100-12.5 MG per tablet   pravastatin (PRAVACHOL) tablet   Other Relevant Orders   Basic metabolic panel (Completed)   GERD (gastroesophageal reflux disease)    Reflux as outlined.  Start zantac 150mg  q day.  Get her back in soon to reassess.        Health care maintenance    Physical today.  She is scheduled today for a mammogram.  Colonoscopy 01/2004-2006.  Due follow up colonoscopy.  Refer to GI.        Hypercholesterolemia    Low cholesterol diet and exercise.  On pravastatin.  Follow lipid panel and liver function tests.       Relevant Medications   amLODIpine (NORVASC) tablet    atenolol (TENORMIN) tablet   losartan-hydrochlorothiazide (HYZAAR) 100-12.5 MG per tablet   pravastatin (PRAVACHOL) tablet   Other Relevant Orders   Lipid panel (Completed)   Hepatic function panel (Completed)   Neck nodule    Followed by ENT and Dr Gabriel Carina.  Biopsy revealed a benign colloid nodule.  Has been released.  Check thyroid function tests.        Relevant Orders   CBC with Differential/Platelet (Completed)   Skin lesion of breast    Persistent skin lesion of left breast.  Refer to dermatology.        Relevant Orders   Ambulatory referral to Dermatology    Other Visit Diagnoses    Thyroid nodule        Relevant Medications    atenolol (TENORMIN) tablet  Other Relevant Orders    TSH (Completed)    T3, free (Completed)    T4, free (Completed)    Gout        Need for prophylactic vaccination against Streptococcus pneumoniae (pneumococcus)        Relevant Orders    Pneumococcal conjugate vaccine 13-valent (Completed)    Colon cancer screening        Relevant Orders    Ambulatory referral to Gastroenterology      I spent 25 minutes with the patient and more than 50% of the time was spent in consultation regarding the above.     Einar Pheasant, MD

## 2014-09-28 NOTE — Patient Instructions (Signed)
Ranitidine 150mg  - take one per day.  Take 30 minutes before breakfast.

## 2014-09-29 ENCOUNTER — Encounter: Payer: Self-pay | Admitting: Internal Medicine

## 2014-10-01 ENCOUNTER — Encounter: Payer: Self-pay | Admitting: Internal Medicine

## 2014-10-01 DIAGNOSIS — Z0000 Encounter for general adult medical examination without abnormal findings: Secondary | ICD-10-CM | POA: Insufficient documentation

## 2014-10-01 DIAGNOSIS — L988 Other specified disorders of the skin and subcutaneous tissue: Secondary | ICD-10-CM | POA: Insufficient documentation

## 2014-10-01 DIAGNOSIS — Z9109 Other allergy status, other than to drugs and biological substances: Secondary | ICD-10-CM | POA: Insufficient documentation

## 2014-10-01 DIAGNOSIS — N649 Disorder of breast, unspecified: Secondary | ICD-10-CM | POA: Insufficient documentation

## 2014-10-01 DIAGNOSIS — K219 Gastro-esophageal reflux disease without esophagitis: Secondary | ICD-10-CM | POA: Insufficient documentation

## 2014-10-01 NOTE — Assessment & Plan Note (Signed)
Blood pressure as outlined.  Continue same medication regimen.  Follow pressures.  Get her back in soon to reassess.  Follow metabolic panel.

## 2014-10-01 NOTE — Assessment & Plan Note (Signed)
Persistent skin lesion of left breast.  Refer to dermatology.

## 2014-10-01 NOTE — Assessment & Plan Note (Signed)
Followed by ENT and Dr Gabriel Carina.  Biopsy revealed a benign colloid nodule.  Has been released.  Check thyroid function tests.

## 2014-10-01 NOTE — Telephone Encounter (Signed)
Unread mychart message mailed to patient 

## 2014-10-01 NOTE — Assessment & Plan Note (Signed)
Physical today.  She is scheduled today for a mammogram.  Colonoscopy 01/2004-2006.  Due follow up colonoscopy.  Refer to GI.

## 2014-10-01 NOTE — Assessment & Plan Note (Signed)
Will stop zyrtec.  Start allegra as directed.  Follow.

## 2014-10-01 NOTE — Assessment & Plan Note (Signed)
Low cholesterol diet and exercise.  On pravastatin.  Follow lipid panel and liver function tests.   

## 2014-10-01 NOTE — Assessment & Plan Note (Signed)
Reflux as outlined.  Start zantac 150mg  q day.  Get her back in soon to reassess.

## 2014-10-07 ENCOUNTER — Encounter: Payer: Self-pay | Admitting: Internal Medicine

## 2014-10-19 ENCOUNTER — Encounter: Payer: Self-pay | Admitting: Internal Medicine

## 2014-11-03 DIAGNOSIS — L821 Other seborrheic keratosis: Secondary | ICD-10-CM | POA: Diagnosis not present

## 2014-11-03 DIAGNOSIS — L218 Other seborrheic dermatitis: Secondary | ICD-10-CM | POA: Diagnosis not present

## 2014-11-03 DIAGNOSIS — L570 Actinic keratosis: Secondary | ICD-10-CM | POA: Diagnosis not present

## 2014-11-29 ENCOUNTER — Ambulatory Visit (AMBULATORY_SURGERY_CENTER): Payer: Self-pay | Admitting: *Deleted

## 2014-11-29 VITALS — Ht 65.0 in | Wt 154.0 lb

## 2014-11-29 DIAGNOSIS — Z1211 Encounter for screening for malignant neoplasm of colon: Secondary | ICD-10-CM

## 2014-11-29 MED ORDER — NA SULFATE-K SULFATE-MG SULF 17.5-3.13-1.6 GM/177ML PO SOLN: 1.0000 | Freq: Once | ORAL | Status: DC

## 2014-11-29 NOTE — Progress Notes (Signed)
No egg or soy allergies Not on home 02 Emmi video declined Not on diet medications No post op complications from anesthesia

## 2014-12-01 DIAGNOSIS — L570 Actinic keratosis: Secondary | ICD-10-CM | POA: Diagnosis not present

## 2014-12-01 DIAGNOSIS — L218 Other seborrheic dermatitis: Secondary | ICD-10-CM | POA: Diagnosis not present

## 2014-12-08 ENCOUNTER — Encounter: Payer: Self-pay | Admitting: Internal Medicine

## 2014-12-08 ENCOUNTER — Ambulatory Visit (AMBULATORY_SURGERY_CENTER): Payer: Medicare Other | Admitting: Internal Medicine

## 2014-12-08 VITALS — BP 128/62 | HR 55 | Temp 97.2°F | Resp 18 | Ht 65.0 in | Wt 154.0 lb

## 2014-12-08 DIAGNOSIS — Z1211 Encounter for screening for malignant neoplasm of colon: Secondary | ICD-10-CM

## 2014-12-08 DIAGNOSIS — I10 Essential (primary) hypertension: Secondary | ICD-10-CM | POA: Diagnosis not present

## 2014-12-08 DIAGNOSIS — R55 Syncope and collapse: Secondary | ICD-10-CM | POA: Diagnosis not present

## 2014-12-08 MED ORDER — SODIUM CHLORIDE 0.9 % IV SOLN: 500.0000 mL | INTRAVENOUS | Status: DC

## 2014-12-08 NOTE — Progress Notes (Signed)
Report to PACU, RN, vss, BBS= Clear.  

## 2014-12-08 NOTE — Op Note (Signed)
[facility]  [address] [place], [id number]   COLONOSCOPY PROCEDURE REPORT  PATIENT: Cheyenne Baker, Cheyenne Baker  MR#: [id number] BIRTHDATE: 10-26-1950 , 27  yrs. old GENDER: female ENDOSCOPIST: Jerene Bears, MD REFERRED BY: Einar Pheasant, MD PROCEDURE DATE:  12/08/2014 PROCEDURE:   Colonoscopy, screening First Screening Colonoscopy - Avg.  risk and is 50 yrs.  old or older - No.  Prior Negative Screening - Now for repeat screening. 10 or more years since last screening  History of Adenoma - Now for follow-up colonoscopy & has been > or = to 3 yrs.  N/A  Polyps removed today? No Recommend repeat exam, <10 yrs? No ASA CLASS:   Class II INDICATIONS:Screening for colonic neoplasia and Colorectal Neoplasm Risk Assessment for this procedure is average risk. MEDICATIONS: Monitored anesthesia care and Propofol 160 mg IV  DESCRIPTION OF PROCEDURE:   After the risks benefits and alternatives of the procedure were thoroughly explained, informed consent was obtained.  The digital rectal exam revealed no rectal mass.   The [id number]  endoscope was introduced through the anus and advanced to the terminal ileum which was intubated for a short distance. No adverse events experienced.   The quality of the prep was good.  (Suprep was used)  The instrument was then slowly withdrawn as the colon was fully examined. Estimated blood loss is zero unless otherwise noted in this procedure report.   COLON FINDINGS: The examined terminal ileum appeared to be normal. There was mild diverticulosis noted in the sigmoid colon with associated angulation.   The examination was otherwise normal. Retroflexed views revealed no abnormalities. The time to cecum = 3.4 Withdrawal time = 7.9   The scope was withdrawn and the procedure completed. COMPLICATIONS: There were no immediate complications.  ENDOSCOPIC IMPRESSION: 1.   The examined terminal ileum appeared to be normal 2.   There was mild  diverticulosis noted in the sigmoid colon 3.   The examination was otherwise normal  RECOMMENDATIONS: 1.  High fiber diet 2.  You should continue to follow colorectal cancer screening guidelines for "routine risk" patients with a repeat colonoscopy in 10 years.  There is no need for FOBT (stool) testing for at least 5 years.  eSigned:  Jerene Bears, MD 12/08/2014 11:38 AM   cc:  the patient, Einar Pheasant, MD

## 2014-12-08 NOTE — Patient Instructions (Signed)
YOU HAD AN ENDOSCOPIC PROCEDURE TODAY AT [facility]:   Refer to the procedure report that was given to you for any specific questions about what was found during the examination.  If the procedure report does not answer your questions, please call your gastroenterologist to clarify.  If you requested that your care partner not be given the details of your procedure findings, then the procedure report has been included in a sealed envelope for you to review at your convenience later.  YOU SHOULD EXPECT: Some feelings of bloating in the abdomen. Passage of more gas than usual.  Walking can help get rid of the air that was put into your GI tract during the procedure and reduce the bloating. If you had a lower endoscopy (such as a colonoscopy or flexible sigmoidoscopy) you may notice spotting of blood in your stool or on the toilet paper. If you underwent a bowel prep for your procedure, you may not have a normal bowel movement for a few days.  Please Note:  You might notice some irritation and congestion in your nose or some drainage.  This is from the oxygen used during your procedure.  There is no need for concern and it should clear up in a day or so.  SYMPTOMS TO REPORT IMMEDIATELY:   Following lower endoscopy (colonoscopy or flexible sigmoidoscopy):  Excessive amounts of blood in the stool  Significant tenderness or worsening of abdominal pains  Swelling of the abdomen that is new, acute  Fever of 100F or higher    For urgent or emergent issues, a gastroenterologist can be reached at any hour by calling [phone].   DIET: Your first meal following the procedure should be a small meal and then it is ok to progress to your normal diet. Heavy or fried foods are harder to digest and may make you feel nauseous or bloated.  Likewise, meals heavy in dairy and vegetables can increase bloating.  Drink plenty of fluids but you should avoid alcoholic beverages for 24  hours.  ACTIVITY:  You should plan to take it easy for the rest of today and you should NOT DRIVE or use heavy machinery until tomorrow (because of the sedation medicines used during the test).    FOLLOW UP: Our staff will call the number listed on your records the next business day following your procedure to check on you and address any questions or concerns that you may have regarding the information given to you following your procedure. If we do not reach you, we will leave a message.  However, if you are feeling well and you are not experiencing any problems, there is no need to return our call.  We will assume that you have returned to your regular daily activities without incident.  If any biopsies were taken you will be contacted by phone or by letter within the next 1-3 weeks.  Please call us at [phone] if you have not heard about the biopsies in 3 weeks.    SIGNATURES/CONFIDENTIALITY: You and/or your care partner have signed paperwork which will be entered into your electronic medical record.  These signatures attest to the fact that that the information above on your After Visit Summary has been reviewed and is understood.  Full responsibility of the confidentiality of this discharge information lies with you and/or your care-partner.  Diverticulosis and high fiber diet information given.  Next colonoscopy 10 years-2026.

## 2014-12-09 ENCOUNTER — Telehealth: Payer: Self-pay | Admitting: *Deleted

## 2014-12-09 NOTE — Telephone Encounter (Signed)
  Follow up Call-  Call back number 12/08/2014  Post procedure Call Back phone  # [phone]  Permission to leave phone message Yes     [facility] on number given in admitting marie  [place] rn

## 2015-04-13 ENCOUNTER — Other Ambulatory Visit: Payer: Self-pay | Admitting: Internal Medicine

## 2015-07-11 ENCOUNTER — Other Ambulatory Visit: Payer: Self-pay | Admitting: Internal Medicine

## 2015-07-25 ENCOUNTER — Other Ambulatory Visit: Payer: Self-pay | Admitting: Internal Medicine

## 2015-07-25 NOTE — Telephone Encounter (Signed)
Please advise, Patient's last visit was in April of last year.

## 2015-07-25 NOTE — Telephone Encounter (Signed)
Due a f/u with me and needs labs.  Schedule a lab appt within the next 1-2 weeks and schedule a f/u appt  Ok to refill until appt.

## 2015-07-28 NOTE — Telephone Encounter (Signed)
Scheduled Labs next week and appt in the beginning of April.

## 2015-08-08 ENCOUNTER — Other Ambulatory Visit: Payer: Self-pay | Admitting: Internal Medicine

## 2015-08-08 ENCOUNTER — Other Ambulatory Visit (INDEPENDENT_AMBULATORY_CARE_PROVIDER_SITE_OTHER): Payer: Medicare Other

## 2015-08-08 DIAGNOSIS — E7800 Pure hypercholesterolemia, unspecified: Secondary | ICD-10-CM | POA: Diagnosis not present

## 2015-08-08 DIAGNOSIS — I10 Essential (primary) hypertension: Secondary | ICD-10-CM

## 2015-08-08 LAB — CBC WITH DIFFERENTIAL/PLATELET
BASOS ABS: 0.1 10*3/uL (ref 0.0–0.1)
Basophils Relative: 0.5 % (ref 0.0–3.0)
Eosinophils Absolute: 0.2 10*3/uL (ref 0.0–0.7)
Eosinophils Relative: 2.3 % (ref 0.0–5.0)
HCT: 43.7 % (ref 36.0–46.0)
Hemoglobin: 14.7 g/dL (ref 12.0–15.0)
LYMPHS ABS: 1.9 10*3/uL (ref 0.7–4.0)
Lymphocytes Relative: 17.5 % (ref 12.0–46.0)
MCHC: 33.7 g/dL (ref 30.0–36.0)
MCV: 92.6 fl (ref 78.0–100.0)
MONO ABS: 0.7 10*3/uL (ref 0.1–1.0)
Monocytes Relative: 6.1 % (ref 3.0–12.0)
NEUTROS PCT: 73.6 % (ref 43.0–77.0)
Neutro Abs: 7.9 10*3/uL — ABNORMAL HIGH (ref 1.4–7.7)
Platelets: 342 10*3/uL (ref 150.0–400.0)
RBC: 4.72 Mil/uL (ref 3.87–5.11)
RDW: 12.9 % (ref 11.5–15.5)
WBC: 10.8 10*3/uL — ABNORMAL HIGH (ref 4.0–10.5)

## 2015-08-08 LAB — HEPATIC FUNCTION PANEL
ALT: 18 U/L (ref 0–35)
AST: 19 U/L (ref 0–37)
Albumin: 4.4 g/dL (ref 3.5–5.2)
Alkaline Phosphatase: 54 U/L (ref 39–117)
BILIRUBIN DIRECT: 0.1 mg/dL (ref 0.0–0.3)
Total Bilirubin: 0.6 mg/dL (ref 0.2–1.2)
Total Protein: 7.4 g/dL (ref 6.0–8.3)

## 2015-08-08 LAB — BASIC METABOLIC PANEL
BUN: 15 mg/dL (ref 6–23)
CHLORIDE: 105 meq/L (ref 96–112)
CO2: 30 mEq/L (ref 19–32)
Calcium: 10.4 mg/dL (ref 8.4–10.5)
Creatinine, Ser: 0.98 mg/dL (ref 0.40–1.20)
GFR: 60.17 mL/min (ref >60.00)
Glucose, Bld: 102 mg/dL — ABNORMAL HIGH (ref 70–99)
POTASSIUM: 4.2 meq/L (ref 3.5–5.1)
Sodium: 142 mEq/L (ref 135–145)

## 2015-08-08 LAB — LIPID PANEL
CHOL/HDL RATIO: 4
Cholesterol: 210 mg/dL — ABNORMAL HIGH (ref 0–200)
HDL: 54.3 mg/dL (ref >39.00)
LDL Cholesterol: 129 mg/dL — ABNORMAL HIGH (ref 0–99)
NonHDL: 155.91
Triglycerides: 137 mg/dL (ref 0.0–149.0)
VLDL: 27.4 mg/dL (ref 0.0–40.0)

## 2015-08-08 LAB — TSH: TSH: 1.37 u[IU]/mL (ref 0.35–4.50)

## 2015-08-08 NOTE — Progress Notes (Unsigned)
Orders placed for labs

## 2015-08-08 NOTE — Progress Notes (Signed)
Patient here today for labs only. °

## 2015-08-09 ENCOUNTER — Encounter: Payer: Self-pay | Admitting: Internal Medicine

## 2015-08-25 ENCOUNTER — Telehealth: Payer: Self-pay | Admitting: Internal Medicine

## 2015-08-25 NOTE — Telephone Encounter (Signed)
[place] called regarding pt stating that she thought Dr Nicki Reaper was going to prescribe her a acid reflux medication. Pharmacist wants to confirm that. [facility] Call pharmacist @ [phone]. Thank you!

## 2015-08-25 NOTE — Telephone Encounter (Signed)
Pt was confused with her allergy medication and acid reflux medication, I explained to the pt that she discussed taking Zantac 150mg  daily with Dr. Nicki Reaper, I confirmed that the pt was not taking the Zantac at all. I expressed to her that if she is having problems with acid reflux she should start it, it is an OTC medication and if it is not working when she comes in for her physical in April she can discuss other options with Dr. Nicki Reaper at that time. Pt verbalized understanding. Please advise, thanks

## 2015-08-25 NOTE — Telephone Encounter (Signed)
Pt states that she was supposed to be starting an acid reflux medication. Please advise, thanks

## 2015-08-25 NOTE — Telephone Encounter (Signed)
At her 09/2014 appt we discussed starting zantac (ranitidine) 150mg  q day.  This is not a prescription medication.  I have not seen her since 09/2014.  Needs f/u appt if having problems.

## 2015-10-04 ENCOUNTER — Encounter: Payer: Self-pay | Admitting: Internal Medicine

## 2015-10-04 ENCOUNTER — Ambulatory Visit (INDEPENDENT_AMBULATORY_CARE_PROVIDER_SITE_OTHER): Payer: Medicare Other | Admitting: Internal Medicine

## 2015-10-04 VITALS — BP 138/80 | HR 64 | Temp 98.4°F | Resp 18 | Ht 63.25 in | Wt 155.2 lb

## 2015-10-04 DIAGNOSIS — Z0000 Encounter for general adult medical examination without abnormal findings: Secondary | ICD-10-CM | POA: Diagnosis not present

## 2015-10-04 DIAGNOSIS — Z1239 Encounter for other screening for malignant neoplasm of breast: Secondary | ICD-10-CM | POA: Diagnosis not present

## 2015-10-04 DIAGNOSIS — E7800 Pure hypercholesterolemia, unspecified: Secondary | ICD-10-CM

## 2015-10-04 DIAGNOSIS — K219 Gastro-esophageal reflux disease without esophagitis: Secondary | ICD-10-CM

## 2015-10-04 DIAGNOSIS — I10 Essential (primary) hypertension: Secondary | ICD-10-CM

## 2015-10-04 NOTE — Progress Notes (Signed)
Patient ID: Cheyenne Baker, female   DOB: August 03, 1950, 65 y.o.   MRN: [id number]   Subjective:    Patient ID: Cheyenne Baker, female    DOB: 1951/01/22, 65 y.o.   MRN: [id number]  HPI  Patient with past history of hypercholesterolemia and hypertension.  Comes in today to follow up on these issues. She stays active.  No chest pain or tightness. No sob.  No abdominal pain or cramping.  Bowels stable.  Feels good.  Stays active.    Past Medical History  Diagnosis Date  . Hypertension   . Hyperlipidemia   . Allergy    Past Surgical History  Procedure Laterality Date  . Cholecystectomy  1995  . Abdominal hysterectomy  1980    ovaries not removed, secondary to bleeding  . Tonsillectomy and adenoidectomy  1975  . Colonoscopy     Family History  Problem Relation Age of Onset  . Hypertension Mother   . Hyperlipidemia Mother   . Heart disease Father   . Colon cancer Neg Hx   . Colon polyps Neg Hx   . Rectal cancer Neg Hx   . Esophageal cancer Neg Hx   . Stomach cancer Neg Hx    Social History   Social History  . Marital Status: Married    Spouse Name: N/A  . Number of Children: N/A  . Years of Education: N/A   Social History Main Topics  . Smoking status: Former Smoker    Types: Cigarettes  . Smokeless tobacco: Never Used  . Alcohol Use: 0.0 oz/week    0 Standard drinks or equivalent per week     Comment: occasionally  . Drug Use: No  . Sexual Activity: Not Asked   Other Topics Concern  . None   Social History Narrative    Outpatient Encounter Prescriptions as of 10/04/2015  Medication Sig  . amLODipine (NORVASC) 5 MG tablet TAKE ONE (1) TABLET BY MOUTH EVERY DAY  . aspirin EC 81 MG tablet Take 81 mg by mouth daily.  Marland Kitchen atenolol (TENORMIN) 50 MG tablet TAKE ONE (1) TABLET BY MOUTH EVERY DAY  . cetirizine (ZYRTEC) 5 MG tablet Take 5 mg by mouth as needed for allergies (seasonal).  Marland Kitchen ketoconazole (NIZORAL) 2 % shampoo   . losartan-hydrochlorothiazide (HYZAAR) 100-12.5  MG tablet TAKE ONE (1) TABLET EACH DAY  . pravastatin (PRAVACHOL) 20 MG tablet TAKE ONE (1) TABLET BY MOUTH EVERY DAY  . ranitidine (ZANTAC) 150 MG tablet Take 150 mg by mouth 2 (two) times daily.   No facility-administered encounter medications on file as of 10/04/2015.    Review of Systems  Constitutional: Negative for appetite change and unexpected weight change.  HENT: Negative for congestion and sinus pressure.   Eyes: Negative for pain and visual disturbance.  Respiratory: Negative for cough, chest tightness and shortness of breath.   Cardiovascular: Negative for chest pain, palpitations and leg swelling.  Gastrointestinal: Negative for nausea, vomiting, abdominal pain and diarrhea.  Genitourinary: Negative for dysuria and difficulty urinating.  Musculoskeletal: Negative for back pain and joint swelling.  Skin: Negative for color change and rash.  Neurological: Negative for dizziness, light-headedness and headaches.  Hematological: Negative for adenopathy. Does not bruise/bleed easily.  Psychiatric/Behavioral: Negative for dysphoric mood and agitation.       Objective:    Physical Exam  Constitutional: She is oriented to person, place, and time. She appears well-developed and well-nourished. No distress.  HENT:  Nose: Nose normal.  Mouth/Throat: Oropharynx is clear  and moist.  Eyes: Right eye exhibits no discharge. Left eye exhibits no discharge. No scleral icterus.  Neck: Neck supple. No thyromegaly present.  Cardiovascular: Normal rate and regular rhythm.   Pulmonary/Chest: Breath sounds normal. No accessory muscle usage. No tachypnea. No respiratory distress. She has no decreased breath sounds. She has no wheezes. She has no rhonchi. Right breast exhibits no inverted nipple, no mass, no nipple discharge and no tenderness (no axillary adenopathy). Left breast exhibits no inverted nipple, no mass, no nipple discharge and no tenderness (no axilarry adenopathy).  Abdominal:  Soft. Bowel sounds are normal. There is no tenderness.  Musculoskeletal: She exhibits no edema or tenderness.  Lymphadenopathy:    She has no cervical adenopathy.  Neurological: She is alert and oriented to person, place, and time.  Skin: Skin is warm. No rash noted. No erythema.  Psychiatric: She has a normal mood and affect. Her behavior is normal.    BP 138/80 mmHg  Pulse 64  Temp(Src) 98.4 F (36.9 C) (Oral)  Resp 18  Ht 5' 3.25" (1.607 m)  Wt 155 lb 4 oz (70.421 kg)  BMI 27.27 kg/m2  SpO2 96% Wt Readings from Last 3 Encounters:  10/04/15 155 lb 4 oz (70.421 kg)  12/08/14 154 lb (69.854 kg)  11/29/14 154 lb (69.854 kg)     Lab Results  Component Value Date   WBC 10.8* 08/08/2015   HGB 14.7 08/08/2015   HCT 43.7 08/08/2015   PLT 342.0 08/08/2015   GLUCOSE 102* 08/08/2015   CHOL 210* 08/08/2015   TRIG 137.0 08/08/2015   HDL 54.30 08/08/2015   LDLDIRECT 143.3 06/11/2013   LDLCALC 129* 08/08/2015   ALT 18 08/08/2015   AST 19 08/08/2015   NA 142 08/08/2015   K 4.2 08/08/2015   CL 105 08/08/2015   CREATININE 0.98 08/08/2015   BUN 15 08/08/2015   CO2 30 08/08/2015   TSH 1.37 08/08/2015        Assessment & Plan:   Problem List Items Addressed This Visit    Essential hypertension, benign    Blood pressure under good control.  Continue same medication regimen.  Follow pressures.  Follow metabolic panel.        Relevant Orders   CBC with Differential/Platelet   Basic metabolic panel   GERD (gastroesophageal reflux disease)    No upper symptoms reported.  On zantac.  Follow.       Relevant Medications   ranitidine (ZANTAC) 150 MG tablet   Health care maintenance    Physical today 10/04/15.  Mammogram scheduled.  Last 09/28/14.  Colonoscopy 12/08/14 - mild diverticulosis.  Recommended f/u colonoscopy in 10 years.        Hypercholesterolemia    Low cholesterol diet and exercise.  Follow lipid panel.  On pravastatin.       Relevant Orders   Lipid panel    Hepatic function panel    Other Visit Diagnoses    Breast cancer screening    -  Primary    Relevant Orders    MM DIGITAL SCREENING BILATERAL        Einar Pheasant, MD

## 2015-10-04 NOTE — Assessment & Plan Note (Signed)
Physical today 10/04/15.  Mammogram scheduled.  Last 09/28/14.  Colonoscopy 12/08/14 - mild diverticulosis.  Recommended f/u colonoscopy in 10 years.

## 2015-10-04 NOTE — Progress Notes (Signed)
Pre-visit discussion using our clinic review tool. No additional management support is needed unless otherwise documented below in the visit note.  

## 2015-10-09 ENCOUNTER — Encounter: Payer: Self-pay | Admitting: Internal Medicine

## 2015-10-09 NOTE — Assessment & Plan Note (Signed)
No upper symptoms reported.  On zantac.  Follow.   

## 2015-10-09 NOTE — Assessment & Plan Note (Signed)
Low cholesterol diet and exercise.  Follow lipid panel.  On pravastatin.   

## 2015-10-09 NOTE — Assessment & Plan Note (Signed)
Blood pressure under good control.  Continue same medication regimen.  Follow pressures.  Follow metabolic panel.   

## 2015-10-10 ENCOUNTER — Other Ambulatory Visit: Payer: Self-pay | Admitting: Internal Medicine

## 2015-10-17 ENCOUNTER — Other Ambulatory Visit: Payer: Self-pay | Admitting: Internal Medicine

## 2015-10-17 ENCOUNTER — Ambulatory Visit
Admission: RE | Admit: 2015-10-17 | Discharge: 2015-10-17 | Disposition: A | Discharge status: Home / Self Care | Payer: Medicare Other | Source: Ambulatory Visit | Attending: Internal Medicine | Admitting: Internal Medicine

## 2015-10-17 DIAGNOSIS — Z1231 Encounter for screening mammogram for malignant neoplasm of breast: Secondary | ICD-10-CM

## 2015-10-17 DIAGNOSIS — Z1239 Encounter for other screening for malignant neoplasm of breast: Secondary | ICD-10-CM

## 2015-10-25 ENCOUNTER — Other Ambulatory Visit: Payer: Self-pay | Admitting: Internal Medicine

## 2015-12-20 ENCOUNTER — Other Ambulatory Visit: Payer: Self-pay | Admitting: Internal Medicine

## 2016-01-04 ENCOUNTER — Other Ambulatory Visit: Payer: Medicare Other

## 2016-01-11 ENCOUNTER — Other Ambulatory Visit (INDEPENDENT_AMBULATORY_CARE_PROVIDER_SITE_OTHER): Payer: Medicare Other

## 2016-01-11 DIAGNOSIS — E7800 Pure hypercholesterolemia, unspecified: Secondary | ICD-10-CM

## 2016-01-11 DIAGNOSIS — I10 Essential (primary) hypertension: Secondary | ICD-10-CM | POA: Diagnosis not present

## 2016-01-11 LAB — CBC WITH DIFFERENTIAL/PLATELET
Basophils Absolute: 0 10*3/uL (ref 0.0–0.1)
Basophils Relative: 0.4 % (ref 0.0–3.0)
EOS PCT: 2.6 % (ref 0.0–5.0)
Eosinophils Absolute: 0.2 10*3/uL (ref 0.0–0.7)
HEMATOCRIT: 41 % (ref 36.0–46.0)
Hemoglobin: 13.9 g/dL (ref 12.0–15.0)
LYMPHS ABS: 1.5 10*3/uL (ref 0.7–4.0)
LYMPHS PCT: 16.4 % (ref 12.0–46.0)
MCHC: 34 g/dL (ref 30.0–36.0)
MCV: 91.9 fl (ref 78.0–100.0)
MONOS PCT: 6.4 % (ref 3.0–12.0)
Monocytes Absolute: 0.6 10*3/uL (ref 0.1–1.0)
NEUTROS ABS: 6.6 10*3/uL (ref 1.4–7.7)
NEUTROS PCT: 74.2 % (ref 43.0–77.0)
PLATELETS: 310 10*3/uL (ref 150.0–400.0)
RBC: 4.46 Mil/uL (ref 3.87–5.11)
RDW: 13 % (ref 11.5–15.5)
WBC: 8.9 10*3/uL (ref 4.0–10.5)

## 2016-01-11 LAB — BASIC METABOLIC PANEL
BUN: 17 mg/dL (ref 6–23)
CALCIUM: 9.9 mg/dL (ref 8.4–10.5)
CHLORIDE: 104 meq/L (ref 96–112)
CO2: 30 meq/L (ref 19–32)
Creatinine, Ser: 0.97 mg/dL (ref 0.40–1.20)
GFR: 60.81 mL/min (ref >60.00)
GLUCOSE: 107 mg/dL — AB (ref 70–99)
Potassium: 3.9 mEq/L (ref 3.5–5.1)
SODIUM: 141 meq/L (ref 135–145)

## 2016-01-11 LAB — LIPID PANEL
CHOL/HDL RATIO: 4
Cholesterol: 207 mg/dL — ABNORMAL HIGH (ref 0–200)
HDL: 48.9 mg/dL (ref >39.00)
LDL Cholesterol: 124 mg/dL — ABNORMAL HIGH (ref 0–99)
NONHDL: 158.39
Triglycerides: 174 mg/dL — ABNORMAL HIGH (ref 0.0–149.0)
VLDL: 34.8 mg/dL (ref 0.0–40.0)

## 2016-01-11 LAB — HEPATIC FUNCTION PANEL
ALBUMIN: 4.2 g/dL (ref 3.5–5.2)
ALT: 20 U/L (ref 0–35)
AST: 20 U/L (ref 0–37)
Alkaline Phosphatase: 48 U/L (ref 39–117)
BILIRUBIN TOTAL: 0.6 mg/dL (ref 0.2–1.2)
Bilirubin, Direct: 0.1 mg/dL (ref 0.0–0.3)
Total Protein: 7.3 g/dL (ref 6.0–8.3)

## 2016-01-12 ENCOUNTER — Other Ambulatory Visit: Payer: Self-pay | Admitting: Internal Medicine

## 2016-01-12 DIAGNOSIS — R739 Hyperglycemia, unspecified: Secondary | ICD-10-CM

## 2016-01-12 NOTE — Progress Notes (Signed)
Orders placed for f/u labs.  

## 2016-02-09 ENCOUNTER — Other Ambulatory Visit (INDEPENDENT_AMBULATORY_CARE_PROVIDER_SITE_OTHER): Payer: Medicare Other

## 2016-02-09 DIAGNOSIS — R739 Hyperglycemia, unspecified: Secondary | ICD-10-CM | POA: Diagnosis not present

## 2016-02-09 LAB — HEMOGLOBIN A1C: Hgb A1c MFr Bld: 5.6 % (ref 4.6–6.5)

## 2016-02-10 ENCOUNTER — Telehealth: Payer: Self-pay | Admitting: Internal Medicine

## 2016-02-10 ENCOUNTER — Encounter: Payer: Self-pay | Admitting: Internal Medicine

## 2016-02-10 LAB — GLUCOSE, FASTING: Glucose, Fasting: 107 mg/dL — ABNORMAL HIGH (ref 65–99)

## 2016-02-10 NOTE — Telephone Encounter (Signed)
Patient was wondering why she did not have her cholesterol re-checked due to her going on a low carb diet and exercising. Patient wondered why her glucose had not come down patient was mailed the [facility] lipid diet per her request.

## 2016-02-10 NOTE — Telephone Encounter (Signed)
Pt called about having a question regarding test results.   Call pt @ [phone]. Thank you!

## 2016-02-13 NOTE — Telephone Encounter (Signed)
Pt was advised of it being to early to check cholesterol.

## 2016-02-13 NOTE — Telephone Encounter (Signed)
Please advise, thanks.

## 2016-02-13 NOTE — Telephone Encounter (Signed)
This lab was just to f/u on her blood sugar.  She just had her cholesterol checked 01/11/16.  Too soon to recheck.  See lab note.

## 2016-05-03 ENCOUNTER — Ambulatory Visit: Payer: Medicare Other

## 2016-06-22 ENCOUNTER — Ambulatory Visit (INDEPENDENT_AMBULATORY_CARE_PROVIDER_SITE_OTHER): Payer: Medicare Other

## 2016-06-22 VITALS — BP 118/64 | HR 65 | Temp 97.8°F | Resp 12 | Ht 63.0 in | Wt 137.8 lb

## 2016-06-22 DIAGNOSIS — Z23 Encounter for immunization: Secondary | ICD-10-CM | POA: Diagnosis not present

## 2016-06-22 DIAGNOSIS — Z0000 Encounter for general adult medical examination without abnormal findings: Secondary | ICD-10-CM | POA: Diagnosis not present

## 2016-06-22 DIAGNOSIS — E2839 Other primary ovarian failure: Secondary | ICD-10-CM | POA: Diagnosis not present

## 2016-06-22 NOTE — Patient Instructions (Addendum)
  [name] , Thank you for taking time to come for your Medicare Wellness Visit. I appreciate your ongoing commitment to your health goals. Please review the following plan we discussed and let me know if I can assist you in the future.   Follow up with Dr. Nicki Reaper as needed.  Happy New Year!!  These are the goals we discussed: Goals    . Healthy Lifetsyle          Stay hydrated and drink plenty of fluids Stay active and continue walking for exercise Low carb foods.  Lean meats, vegetables       This is a list of the screening recommended for you and due dates:  Health Maintenance  Topic Date Due  .  Hepatitis C: One time screening is recommended by Center for Disease Control  (CDC) for  adults born from [birth year] through 1967.   [birth year]  . DEXA scan (bone density measurement)  08/16/2013  . Pneumonia vaccines (2 of 2 - PPSV23) 09/28/2015  . Mammogram  10/16/2016  . Tetanus Vaccine  01/23/2021  . Colon Cancer Screening  12/07/2024  . Flu Shot  Completed  . Shingles Vaccine  Completed   Bone Densitometry Introduction Bone densitometry is an imaging test that uses a special X-ray to measure the amount of calcium and other minerals in your bones (bone density). This test is also known as a bone mineral density test or dual-energy X-ray absorptiometry (DXA). The test can measure bone density at your hip and your spine. It is similar to having a regular X-ray. You may have this test to:  Diagnose a condition that causes weak or thin bones (osteoporosis).  Predict your risk of a broken bone (fracture).  Determine how well osteoporosis treatment is working. Tell a health care provider about:  Any allergies you have.  All medicines you are taking, including vitamins, herbs, eye drops, creams, and over-the-counter medicines.  Any problems you or family members have had with anesthetic medicines.  Any blood disorders you have.  Any surgeries you have had.  Any medical conditions  you have.  Possibility of pregnancy.  Any other medical test you had within the previous 14 days that used contrast material. What are the risks? Generally, this is a safe procedure. However, problems can occur and may include the following:  This test exposes you to a very small amount of radiation.  The risks of radiation exposure may be greater to unborn children. What happens before the procedure?  Do not take any calcium supplements for 24 hours before having the test. You can otherwise eat and drink what you usually do.  Take off all metal jewelry, eyeglasses, dental appliances, and any other metal objects. What happens during the procedure?  You may lie on an exam table. There will be an X-ray generator below you and an imaging device above you.  Other devices, such as boxes or braces, may be used to position your body properly for the scan.  You will need to lie still while the machine slowly scans your body.  The images will show up on a computer monitor. What happens after the procedure? You may need more testing at a later time. This information is not intended to replace advice given to you by your health care provider. Make sure you discuss any questions you have with your health care provider. Document Released: 07/03/2004 Document Revised: 11/17/2015 Document Reviewed: 11/19/2013  2017 Elsevier

## 2016-06-22 NOTE — Progress Notes (Signed)
Subjective:   Cheyenne Baker is a 65 y.o. female who presents for an Initial Medicare Annual Wellness Visit.  Review of Systems    No ROS.  Medicare Wellness Visit.  Cardiac Risk Factors include: advanced age (>14men, >71 women);hypertension     Objective:    Today's Vitals   06/22/16 1421  BP: 118/64  Pulse: 65  Resp: 12  Temp: 97.8 F (36.6 C)  TempSrc: Oral  SpO2: 95%  Weight: 137 lb 12.8 oz (62.5 kg)  Height: 5\' 3"  (1.6 m)   Body mass index is 24.41 kg/m.   Current Medications (verified) Outpatient Encounter Prescriptions as of 06/22/2016  Medication Sig  . amLODipine (NORVASC) 5 MG tablet TAKE ONE (1) TABLET BY MOUTH EVERY DAY  . aspirin EC 81 MG tablet Take 81 mg by mouth daily.  Marland Kitchen atenolol (TENORMIN) 50 MG tablet TAKE ONE (1) TABLET EACH DAY  . ketoconazole (NIZORAL) 2 % shampoo   . losartan-hydrochlorothiazide (HYZAAR) 100-12.5 MG tablet TAKE ONE (1) TABLET BY MOUTH EVERY DAY  . pravastatin (PRAVACHOL) 20 MG tablet TAKE ONE (1) TABLET BY MOUTH EVERY DAY  . cetirizine (ZYRTEC) 5 MG tablet Take 5 mg by mouth as needed for allergies (seasonal).  . [DISCONTINUED] ranitidine (ZANTAC) 150 MG tablet Take 150 mg by mouth 2 (two) times daily.   No facility-administered encounter medications on file as of 06/22/2016.     Allergies (verified) Patient has no known allergies.   History: Past Medical History:  Diagnosis Date  . Allergy   . Hyperlipidemia   . Hypertension    Past Surgical History:  Procedure Laterality Date  . ABDOMINAL HYSTERECTOMY  1980   ovaries not removed, secondary to bleeding  . CHOLECYSTECTOMY  1995  . COLONOSCOPY    . TONSILLECTOMY AND ADENOIDECTOMY  1975   Family History  Problem Relation Age of Onset  . Hypertension Mother   . Hyperlipidemia Mother   . Heart disease Father   . Colon cancer Neg Hx   . Colon polyps Neg Hx   . Rectal cancer Neg Hx   . Esophageal cancer Neg Hx   . Stomach cancer Neg Hx    Social History    Occupational History  . Not on file.   Social History Main Topics  . Smoking status: Former Smoker    Types: Cigarettes  . Smokeless tobacco: Never Used  . Alcohol use 0.0 oz/week     Comment: occasionally  . Drug use: No  . Sexual activity: Yes    Tobacco Counseling Counseling given: Not Answered   Activities of Daily Living In your present state of health, do you have any difficulty performing the following activities: 06/22/2016  Hearing? N  Vision? N  Difficulty concentrating or making decisions? N  Walking or climbing stairs? N  Dressing or bathing? N  Doing errands, shopping? N  Preparing Food and eating ? N  Using the Toilet? N  In the past six months, have you accidently leaked urine? N  Do you have problems with loss of bowel control? N  Managing your Medications? N  Managing your Finances? N  Housekeeping or managing your Housekeeping? N  Some recent data might be hidden    Immunizations and Health Maintenance Immunization History  Administered Date(s) Administered  . Influenza, High Dose Seasonal PF 06/22/2016  . Pneumococcal Conjugate-13 09/28/2014  . Zoster 10/26/2013   Health Maintenance Due  Topic Date Due  . Hepatitis C Screening  [birth year]  . DEXA SCAN  08/16/2013  . PNA vac Low Risk Adult (2 of 2 - PPSV23) 09/28/2015    Patient Care Team: Einar Pheasant, MD as PCP - General (Internal Medicine)  Indicate any recent Medical Services you may have received from other than [facility] providers in the past year (date may be approximate).     Assessment:   This is a routine wellness examination for Cheyenne Baker. The goal of the wellness visit is to assist the patient how to close the gaps in care and create a preventative care plan for the patient.   Osteoporosis risk reviewed. DEXA SCAN ordered; follow as directed. Educational material provided.  Medications reviewed; taking without issues or barriers.  Safety issues reviewed; smoke detectors in  the home. No firearms in the home. Wears seatbelts when driving or riding with others. No violence in the home.  No identified risk were noted; The patient was oriented x 3; appropriate in dress and manner and no objective failures at ADL's or IADL's.   BMI; discussed the importance of a healthy diet, water intake and exercise. Educational material provided.  High dose influenza vaccine administered L deltoid, tolerated well.  Educational material provided.  Hepatitis C Screening discussed; educational material provided.  Patient Concerns: None at this time. Follow up with PCP as needed.  Hearing/Vision screen Hearing Screening Comments: Passes the whisper test Vision Screening Comments: Followed by Dr. Matilde Sprang  Wears glasses Last OV 05/2016  Dietary issues and exercise activities discussed: Current Exercise Habits: Home exercise routine, Type of exercise: walking, Time (Minutes): 60, Frequency (Times/Week): 1, Weekly Exercise (Minutes/Week): 60, Intensity: Moderate  Goals    . Healthy Lifetsyle          Stay hydrated and drink plenty of fluids Stay active and continue walking for exercise Low carb foods.  Lean meats, vegetables      Depression Screen PHQ 2/9 Scores 06/22/2016 10/04/2015 09/28/2014 10/15/2013  PHQ - 2 Score 0 0 0 0    Fall Risk Fall Risk  06/22/2016 10/04/2015 09/28/2014 10/15/2013  Falls in the past year? No No No No    Cognitive Function: MMSE - Mini Mental State Exam 06/22/2016  Orientation to time 5  Orientation to Place 5  Registration 3  Attention/ Calculation 5  Recall 3  Language- name 2 objects 2  Language- repeat 1  Language- follow 3 step command 3  Language- read & follow direction 1  Write a sentence 1  Copy design 1  Total score 30        Screening Tests Health Maintenance  Topic Date Due  . Hepatitis C Screening  1951/02/26  . DEXA SCAN  08/16/2013  . PNA vac Low Risk Adult (2 of 2 - PPSV23) 09/28/2015  . MAMMOGRAM  10/16/2016    . TETANUS/TDAP  01/23/2021  . COLONOSCOPY  12/07/2024  . INFLUENZA VACCINE  Completed  . ZOSTAVAX  Completed      Plan:    End of life planning; Advance aging; Advanced directives discussed. No HCPOA/Living Will.   Additional information provided to help her start the conversation with her family.  Copy of HCPOA/Living Will short forms requested upon completion.  Time spent on this topic is 20 minutes.  Medicare Attestation I have personally reviewed: The patient's medical and social history Their use of alcohol, tobacco or illicit drugs Their current medications and supplements The patient's functional ability including ADLs,fall risks, home safety risks, cognitive, and hearing and visual impairment Diet and physical activities Evidence for depression   The  patient's weight, height, BMI, and visual acuity have been recorded in the chart.  I have made referrals and provided education to the patient based on review of the above and I have provided the patient with a written personalized care plan for preventive services.    During the course of the visit, Cheyenne Baker was educated and counseled about the following appropriate screening and preventive services:   Vaccines to include Pneumoccal, Influenza, Hepatitis B, Td, Zostavax, HCV  Electrocardiogram  Cardiovascular disease screening  Colorectal cancer screening  Bone density screening  Diabetes screening  Glaucoma screening  Mammography/PAP  Nutrition counseling  Smoking cessation counseling  Patient Instructions (the written plan) were given to the patient.    Varney Biles, LPN   [id number]    Reviewed above information.  Agree with plan.   Dr Nicki Reaper

## 2016-07-13 ENCOUNTER — Other Ambulatory Visit: Payer: Self-pay

## 2016-07-13 MED ORDER — AMLODIPINE BESYLATE 5 MG PO TABS: ORAL_TABLET | ORAL | 1 refills | Status: DC

## 2016-07-13 MED ORDER — PRAVASTATIN SODIUM 20 MG PO TABS: ORAL_TABLET | ORAL | 1 refills | Status: DC

## 2016-07-13 NOTE — Telephone Encounter (Signed)
Medication has been refilled.

## 2016-07-16 ENCOUNTER — Encounter: Payer: Self-pay | Admitting: Internal Medicine

## 2016-08-27 ENCOUNTER — Other Ambulatory Visit: Payer: Medicare Other

## 2016-10-02 ENCOUNTER — Ambulatory Visit
Admission: RE | Admit: 2016-10-02 | Discharge: 2016-10-02 | Disposition: A | Discharge status: Home / Self Care | Payer: Medicare Other | Source: Ambulatory Visit | Attending: Internal Medicine | Admitting: Internal Medicine

## 2016-10-02 DIAGNOSIS — E2839 Other primary ovarian failure: Secondary | ICD-10-CM | POA: Diagnosis not present

## 2016-10-02 DIAGNOSIS — Z780 Asymptomatic menopausal state: Secondary | ICD-10-CM | POA: Insufficient documentation

## 2016-10-03 ENCOUNTER — Encounter: Payer: Self-pay | Admitting: Internal Medicine

## 2016-10-04 ENCOUNTER — Other Ambulatory Visit: Payer: Self-pay | Admitting: Internal Medicine

## 2016-10-04 ENCOUNTER — Ambulatory Visit (INDEPENDENT_AMBULATORY_CARE_PROVIDER_SITE_OTHER): Payer: Medicare Other | Admitting: Internal Medicine

## 2016-10-04 ENCOUNTER — Encounter: Payer: Self-pay | Admitting: Internal Medicine

## 2016-10-04 VITALS — BP 118/64 | HR 64 | Temp 98.0°F | Resp 12 | Ht 63.0 in | Wt 134.2 lb

## 2016-10-04 DIAGNOSIS — R739 Hyperglycemia, unspecified: Secondary | ICD-10-CM

## 2016-10-04 DIAGNOSIS — E7800 Pure hypercholesterolemia, unspecified: Secondary | ICD-10-CM

## 2016-10-04 DIAGNOSIS — E049 Nontoxic goiter, unspecified: Secondary | ICD-10-CM

## 2016-10-04 DIAGNOSIS — Z0000 Encounter for general adult medical examination without abnormal findings: Secondary | ICD-10-CM

## 2016-10-04 DIAGNOSIS — F439 Reaction to severe stress, unspecified: Secondary | ICD-10-CM

## 2016-10-04 DIAGNOSIS — I10 Essential (primary) hypertension: Secondary | ICD-10-CM | POA: Diagnosis not present

## 2016-10-04 DIAGNOSIS — Z23 Encounter for immunization: Secondary | ICD-10-CM

## 2016-10-04 DIAGNOSIS — Z1231 Encounter for screening mammogram for malignant neoplasm of breast: Secondary | ICD-10-CM

## 2016-10-04 LAB — LIPID PANEL
CHOL/HDL RATIO: 3
Cholesterol: 207 mg/dL — ABNORMAL HIGH (ref 0–200)
HDL: 60.2 mg/dL (ref >39.00)
LDL Cholesterol: 123 mg/dL — ABNORMAL HIGH (ref 0–99)
NONHDL: 146.32
Triglycerides: 119 mg/dL (ref 0.0–149.0)
VLDL: 23.8 mg/dL (ref 0.0–40.0)

## 2016-10-04 LAB — HEMOGLOBIN A1C: HEMOGLOBIN A1C: 5.5 % (ref 4.6–6.5)

## 2016-10-04 LAB — HEPATIC FUNCTION PANEL
ALBUMIN: 4.6 g/dL (ref 3.5–5.2)
ALK PHOS: 47 U/L (ref 39–117)
ALT: 13 U/L (ref 0–35)
AST: 18 U/L (ref 0–37)
BILIRUBIN TOTAL: 0.7 mg/dL (ref 0.2–1.2)
Bilirubin, Direct: 0.1 mg/dL (ref 0.0–0.3)
Total Protein: 7.5 g/dL (ref 6.0–8.3)

## 2016-10-04 LAB — BASIC METABOLIC PANEL
BUN: 13 mg/dL (ref 6–23)
CHLORIDE: 102 meq/L (ref 96–112)
CO2: 31 meq/L (ref 19–32)
Calcium: 10.2 mg/dL (ref 8.4–10.5)
Creatinine, Ser: 0.84 mg/dL (ref 0.40–1.20)
GFR: 71.64 mL/min (ref >60.00)
Glucose, Bld: 98 mg/dL (ref 70–99)
POTASSIUM: 3.8 meq/L (ref 3.5–5.1)
Sodium: 139 mEq/L (ref 135–145)

## 2016-10-04 LAB — TSH: TSH: 1.12 u[IU]/mL (ref 0.35–4.50)

## 2016-10-04 NOTE — Assessment & Plan Note (Signed)
Physical today 10/04/16.  Mammogram 10/17/15 - Birads I.  Colonoscopy 12/08/14 mild diverticulosis.  Recommended f/u colonoscopy in 10 years (Dr Hilarie Fredrickson).  Is s/p hysterectomy.

## 2016-10-04 NOTE — Progress Notes (Signed)
Pre-visit discussion using our clinic review tool. No additional management support is needed unless otherwise documented below in the visit note.  

## 2016-10-04 NOTE — Progress Notes (Signed)
Patient ID: Irwin Brakeman, female   DOB: 1951/06/16, 66 y.o.   MRN: [id number]   Subjective:    Patient ID: Irwin Brakeman, female    DOB: 1950-09-21, 66 y.o.   MRN: [id number]  HPI  Patient with past history of hypercholesterolemia and hypertension.  She comes in today to follow up on these issues as well as for a complete physical exam.   Her husband died recently.  Was unexpected.  Discussed with her today. She feels she is handling things relatively well.  Has lost weight.  States she had lost 26 pounds before he passed.  She is eating better now.  Is walking.  No chest pain.  No sob.  No acid reflux.  No swallowing problems.  Blood pressures doing well.  Bowels stable.     Past Medical History:  Diagnosis Date  . Allergy   . Hyperlipidemia   . Hypertension    Past Surgical History:  Procedure Laterality Date  . ABDOMINAL HYSTERECTOMY  1980   ovaries not removed, secondary to bleeding  . CHOLECYSTECTOMY  1995  . COLONOSCOPY    . TONSILLECTOMY AND ADENOIDECTOMY  1975   Family History  Problem Relation Age of Onset  . Hypertension Mother   . Hyperlipidemia Mother   . Heart disease Father   . Colon cancer Neg Hx   . Colon polyps Neg Hx   . Rectal cancer Neg Hx   . Esophageal cancer Neg Hx   . Stomach cancer Neg Hx    Social History   Social History  . Marital status: Married    Spouse name: N/A  . Number of children: N/A  . Years of education: N/A   Social History Main Topics  . Smoking status: Former Smoker    Types: Cigarettes  . Smokeless tobacco: Never Used  . Alcohol use 0.0 oz/week     Comment: occasionally  . Drug use: No  . Sexual activity: Yes   Other Topics Concern  . None   Social History Narrative  . None    Outpatient Encounter Prescriptions as of 10/04/2016  Medication Sig  . amLODipine (NORVASC) 5 MG tablet TAKE ONE (1) TABLET BY MOUTH EVERY DAY  . aspirin EC 81 MG tablet Take 81 mg by mouth daily.  Marland Kitchen atenolol (TENORMIN) 50 MG tablet TAKE  ONE (1) TABLET EACH DAY  . cetirizine (ZYRTEC) 5 MG tablet Take 5 mg by mouth as needed for allergies (seasonal).  Marland Kitchen ketoconazole (NIZORAL) 2 % shampoo   . losartan-hydrochlorothiazide (HYZAAR) 100-12.5 MG tablet TAKE ONE (1) TABLET BY MOUTH EVERY DAY  . pravastatin (PRAVACHOL) 20 MG tablet TAKE ONE (1) TABLET BY MOUTH EVERY DAY   No facility-administered encounter medications on file as of 10/04/2016.     Review of Systems  Constitutional: Negative for appetite change.       Has lost weight.   HENT: Negative for congestion and sinus pressure.   Eyes: Negative for pain and visual disturbance.  Respiratory: Negative for cough, chest tightness and shortness of breath.   Cardiovascular: Negative for chest pain, palpitations and leg swelling.  Gastrointestinal: Negative for abdominal pain, diarrhea, nausea and vomiting.  Genitourinary: Negative for difficulty urinating and dysuria.  Musculoskeletal: Negative for back pain and joint swelling.  Skin: Negative for color change and rash.  Neurological: Negative for dizziness, light-headedness and headaches.  Hematological: Negative for adenopathy. Does not bruise/bleed easily.  Psychiatric/Behavioral: Negative for agitation and dysphoric mood.  Increased stress with her husband's death.        Objective:     Blood pressure rechecked by me:  134/72  Physical Exam  Constitutional: She appears well-developed and well-nourished. No distress.  HENT:  Nose: Nose normal.  Mouth/Throat: Oropharynx is clear and moist.  Neck: Neck supple.  Enlarged thyroid.   Cardiovascular: Normal rate and regular rhythm.   Pulmonary/Chest: Breath sounds normal. No respiratory distress. She has no wheezes.  Abdominal: Soft. Bowel sounds are normal. There is no tenderness.  Musculoskeletal: She exhibits no edema or tenderness.  Lymphadenopathy:    She has no cervical adenopathy.  Skin: No rash noted. No erythema.  Psychiatric: She has a normal mood and  affect. Her behavior is normal.    BP 118/64 (BP Location: Left Arm, Patient Position: Sitting, Cuff Size: Normal)   Pulse 64   Temp 98 F (36.7 C) (Oral)   Resp 12   Ht 5\' 3"  (1.6 m)   Wt 134 lb 3.2 oz (60.9 kg)   SpO2 99%   BMI 23.77 kg/m  Wt Readings from Last 3 Encounters:  10/04/16 134 lb 3.2 oz (60.9 kg)  06/22/16 137 lb 12.8 oz (62.5 kg)  10/04/15 155 lb 4 oz (70.4 kg)     Lab Results  Component Value Date   WBC 8.9 01/11/2016   HGB 13.9 01/11/2016   HCT 41.0 01/11/2016   PLT 310.0 01/11/2016   GLUCOSE 98 10/04/2016   CHOL 207 (H) 10/04/2016   TRIG 119.0 10/04/2016   HDL 60.20 10/04/2016   LDLDIRECT 143.3 06/11/2013   LDLCALC 123 (H) 10/04/2016   ALT 13 10/04/2016   AST 18 10/04/2016   NA 139 10/04/2016   K 3.8 10/04/2016   CL 102 10/04/2016   CREATININE 0.84 10/04/2016   BUN 13 10/04/2016   CO2 31 10/04/2016   TSH 1.12 10/04/2016   HGBA1C 5.5 10/04/2016    Dexascan  Result Date: 10/02/2016 EXAM: DUAL X-RAY ABSORPTIOMETRY (DXA) FOR BONE MINERAL DENSITY IMPRESSION: Dear Dr. Nicki Reaper, Your patient Liyah Higham completed a BMD test on 10/02/2016 using the [employer] (analysis version: 14.10) manufactured by [employer]. The following summarizes the results of our evaluation. PATIENT BIOGRAPHICAL: Name: Pierce, Biagini Patient ID: [id number] Birth Date: 1950/10/11 Height: 63.0 in. Gender: Female Exam Date: 10/02/2016 Weight: 134.6 lbs. Indications: Caucasian, Height Loss, Hysterectomy, Postmenopausal Fractures: Treatments: ASPRIN 81 MG, pravastatin, zyrtec ASSESSMENT: L-3 & L-4 were excluded due to degenerative changes. The BMD measured at Femur Neck Left is 0.920 g/cm2 with a T-score of -0.8. This patient is considered NORMAL according to [place]) criteria. Site Region Measured Measured WHO Young Adult BMD Date       Age      Classification T-score AP Spine L1-L2 10/02/2016 68.1 Normal -0.8 1.078 g/cm2 AP Spine L1-L2 10/16/2007 59.1  Osteopenia -1.5 0.997 g/cm2 AP Spine L1-L2 10/16/2007 59.1 Osteopenia -1.2 1.032 g/cm2 DualFemur Neck Left 10/02/2016 68.1 Normal -0.8 0.920 g/cm2 DualFemur Neck Left 10/16/2007 59.1 Osteopenia -1.3 0.857 g/cm2 DualFemur Neck Left 10/16/2007 59.1 Osteopenia -1.4 0.847 g/cm2 World Health Organization [facility]) criteria for post-menopausal, Caucasian Women: Normal:       T-score at or above -1 SD Osteopenia:   T-score between -1 and -2.5 SD Osteoporosis: T-score at or below -2.5 SD RECOMMENDATIONS: [place] recommends that FDA-approved medical therapies be considered in postmenopausal women and men age 61 or older with a: 1. Hip or vertebral (clinical or morphometric) fracture. 2. T-score  of < -2.5 at the spine or hip. 3. Ten-year fracture probability by FRAX of 3% or greater for hip fracture or 20% or greater for major osteoporotic fracture. All treatment decisions require clinical judgment and consideration of individual patient factors, including patient preferences, co-morbidities, previous drug use, risk factors not captured in the FRAX model (e.g. falls, vitamin D deficiency, increased bone turnover, interval significant decline in bone density) and possible under - or over-estimation of fracture risk by FRAX. All patients should ensure an adequate intake of dietary calcium (1200 mg/d) and vitamin D (800 IU daily) unless contraindicated. FOLLOW-UP: People with diagnosed cases of osteoporosis or at high risk for fracture should have regular bone mineral density tests. For patients eligible for Medicare, routine testing is allowed once every 2 years. The testing frequency can be increased to one year for patients who have rapidly progressing disease, those who are receiving or discontinuing medical therapy to restore bone mass, or have additional risk factors. I have reviewed this report, and agree with the above findings. Mark A. Thornton Papas, M.D. [facility] Electronically Signed   By:  Lavonia Dana M.D.   On: 10/02/2016 10:45       Assessment & Plan:   Problem List Items Addressed This Visit    Enlarged thyroid    Has been followed by ENT and Dr Gabriel Carina.  Biopsy revealed benign colloid nodule.  Has been released.  Follow thyroid function tests.        Essential hypertension, benign - Primary    Blood pressure under good control.  Continue same medication regimen.  Follow pressures.  Follow metabolic panel.        Relevant Orders   TSH (Completed)   Basic metabolic panel (Completed)   Health care maintenance    Physical today 10/04/16.  Mammogram 10/17/15 - Birads I.  Colonoscopy 12/08/14 mild diverticulosis.  Recommended f/u colonoscopy in 10 years (Dr Hilarie Fredrickson).  Is s/p hysterectomy.        Hypercholesterolemia    On pravastatin.  Low cholesterol diet and exercise.  Follow lipid panel and liver function tests.        Relevant Orders   Lipid panel (Completed)   Hepatic function panel (Completed)   Stress    Increased stress with her husband's recent passing.  Discussed with her today.  States she has good support.  Does not feel needs anything more at this time.  Follow.         Other Visit Diagnoses    Hyperglycemia       Relevant Orders   Hemoglobin A1c (Completed)   Need for pneumococcal vaccine           Einar Pheasant, MD

## 2016-10-07 ENCOUNTER — Encounter: Payer: Self-pay | Admitting: Internal Medicine

## 2016-10-07 DIAGNOSIS — F439 Reaction to severe stress, unspecified: Secondary | ICD-10-CM | POA: Insufficient documentation

## 2016-10-07 NOTE — Assessment & Plan Note (Signed)
Has been followed by ENT and Dr Gabriel Carina.  Biopsy revealed benign colloid nodule.  Has been released.  Follow thyroid function tests.

## 2016-10-07 NOTE — Assessment & Plan Note (Signed)
Blood pressure under good control.  Continue same medication regimen.  Follow pressures.  Follow metabolic panel.   

## 2016-10-07 NOTE — Assessment & Plan Note (Signed)
Increased stress with her husband's recent passing.  Discussed with her today.  States she has good support.  Does not feel needs anything more at this time.  Follow.

## 2016-10-07 NOTE — Assessment & Plan Note (Signed)
On pravastatin.  Low cholesterol diet and exercise.  Follow lipid panel and liver function tests.   

## 2016-10-08 ENCOUNTER — Encounter: Payer: Self-pay | Admitting: Internal Medicine

## 2016-11-06 ENCOUNTER — Ambulatory Visit
Admission: RE | Admit: 2016-11-06 | Discharge: 2016-11-06 | Disposition: A | Discharge status: Home / Self Care | Payer: Medicare Other | Source: Ambulatory Visit | Attending: Internal Medicine | Admitting: Internal Medicine

## 2016-11-06 DIAGNOSIS — Z1231 Encounter for screening mammogram for malignant neoplasm of breast: Secondary | ICD-10-CM | POA: Diagnosis not present

## 2016-11-13 ENCOUNTER — Ambulatory Visit (INDEPENDENT_AMBULATORY_CARE_PROVIDER_SITE_OTHER): Payer: Medicare Other | Admitting: Family

## 2016-11-13 ENCOUNTER — Encounter: Payer: Self-pay | Admitting: Family

## 2016-11-13 VITALS — BP 134/78 | HR 54 | Temp 97.6°F | Resp 16 | Wt 134.0 lb

## 2016-11-13 DIAGNOSIS — L249 Irritant contact dermatitis, unspecified cause: Secondary | ICD-10-CM | POA: Diagnosis not present

## 2016-11-13 MED ORDER — TRIAMCINOLONE ACETONIDE 0.025 % EX CREA: 1.0000 "application " | TOPICAL_CREAM | Freq: Two times a day (BID) | CUTANEOUS | 0 refills | Status: DC

## 2016-11-13 NOTE — Patient Instructions (Signed)
Trial prescription strength steroid cream., It really does appear to be a contact dermatitis, perhaps from a plant.   Please let me know if not better on the topical steroid as I can also prescribe an oral steroid.   Contact Dermatitis Dermatitis is redness, soreness, and swelling (inflammation) of the skin. Contact dermatitis is a reaction to certain substances that touch the skin. There are two types of contact dermatitis:  Irritant contact dermatitis. This type is caused by something that irritates your skin, such as dry hands from washing them too much. This type does not require previous exposure to the substance for a reaction to occur. This type is more common.  Allergic contact dermatitis. This type is caused by a substance that you are allergic to, such as a nickel allergy or poison ivy. This type only occurs if you have been exposed to the substance (allergen) before. Upon a repeat exposure, your body reacts to the substance. This type is less common. What are the causes? Many different substances can cause contact dermatitis. Irritant contact dermatitis is most commonly caused by exposure to:  Makeup.  Soaps.  Detergents.  Bleaches.  Acids.  Metal salts, such as nickel. Allergic contact dermatitis is most commonly caused by exposure to:  Poisonous plants.  Chemicals.  Jewelry.  Latex.  Medicines.  Preservatives in products, such as clothing. What increases the risk? This condition is more likely to develop in:  People who have jobs that expose them to irritants or allergens.  People who have certain medical conditions, such as asthma or eczema. What are the signs or symptoms? Symptoms of this condition may occur anywhere on your body where the irritant has touched you or is touched by you. Symptoms include:  Dryness or flaking.  Redness.  Cracks.  Itching.  Pain or a burning feeling.  Blisters.  Drainage of small amounts of blood or clear fluid  from skin cracks. With allergic contact dermatitis, there may also be swelling in areas such as the eyelids, mouth, or genitals. How is this diagnosed? This condition is diagnosed with a medical history and physical exam. A patch skin test may be performed to help determine the cause. If the condition is related to your job, you may need to see an occupational medicine specialist. How is this treated? Treatment for this condition includes figuring out what caused the reaction and protecting your skin from further contact. Treatment may also include:  Steroid creams or ointments. Oral steroid medicines may be needed in more severe cases.  Antibiotics or antibacterial ointments, if a skin infection is present.  Antihistamine lotion or an antihistamine taken by mouth to ease itching.  A bandage (dressing). Follow these instructions at home: [place] your skin as needed.  Apply cool compresses to the affected areas.  Try taking a bath with:  Epsom salts. Follow the instructions on the packaging. You can get these at your local pharmacy or grocery store.  Baking soda. Pour a small amount into the bath as directed by your health care provider.  Colloidal oatmeal. Follow the instructions on the packaging. You can get this at your local pharmacy or grocery store.  Try applying baking soda paste to your skin. Stir water into baking soda until it reaches a paste-like consistency.  Do not scratch your skin.  Bathe less frequently, such as every other day.  Bathe in lukewarm water. Avoid using hot water. Medicines   Take or apply over-the-counter and prescription medicines only  as told by your health care provider.  If you were prescribed an antibiotic medicine, take or apply your antibiotic as told by your health care provider. Do not stop using the antibiotic even if your condition starts to improve. General instructions   Keep all follow-up visits as told by your  health care provider. This is important.  Avoid the substance that caused your reaction. If you do not know what caused it, keep a journal to try to track what caused it. Write down:  What you eat.  What cosmetic products you use.  What you drink.  What you wear in the affected area. This includes jewelry.  If you were given a dressing, take care of it as told by your health care provider. This includes when to change and remove it. Contact a health care provider if:  Your condition does not improve with treatment.  Your condition gets worse.  You have signs of infection such as swelling, tenderness, redness, soreness, or warmth in the affected area.  You have a fever.  You have new symptoms. Get help right away if:  You have a severe headache, neck pain, or neck stiffness.  You vomit.  You feel very sleepy.  You notice red streaks coming from the affected area.  Your bone or joint underneath the affected area becomes painful after the skin has healed.  The affected area turns darker.  You have difficulty breathing. This information is not intended to replace advice given to you by your health care provider. Make sure you discuss any questions you have with your health care provider. Document Released: 06/08/2000 Document Revised: 11/17/2015 Document Reviewed: 10/27/2014 Elsevier Interactive Patient Education  2017 [employer].

## 2016-11-13 NOTE — Progress Notes (Signed)
Pre visit review using our clinic review tool, if applicable. No additional management support is needed unless otherwise documented below in the visit note. 

## 2016-11-13 NOTE — Progress Notes (Signed)
Subjective:    Patient ID: Cheyenne Baker, female    DOB: 1950/11/12, 66 y.o.   MRN: [id number]  CC: Cheyenne Baker is a 66 y.o. female who presents today for an acute visit.    HPI: Ictchy rash on right ankle x 2 weeks, worsening. Spreading. Now on right side of abdomen. No fever, myalagias, bulls eye rash.  No tick bite however thought she felt something 'crawling on ankle' when working outside which correlated with rash.  Has never been allergic to poison ivy to her knowledge. Tried cortizone 10 which helped itching however couldn't tell difference of rash H/o seasonal allergies  No eczema, asthma      HISTORY:  Past Medical History:  Diagnosis Date  . Allergy   . Hyperlipidemia   . Hypertension    Past Surgical History:  Procedure Laterality Date  . ABDOMINAL HYSTERECTOMY  1980   ovaries not removed, secondary to bleeding  . CHOLECYSTECTOMY  1995  . COLONOSCOPY    . TONSILLECTOMY AND ADENOIDECTOMY  1975   Family History  Problem Relation Age of Onset  . Hypertension Mother   . Hyperlipidemia Mother   . Heart disease Father   . Colon cancer Neg Hx   . Colon polyps Neg Hx   . Rectal cancer Neg Hx   . Esophageal cancer Neg Hx   . Stomach cancer Neg Hx     Allergies: Patient has no known allergies. Current Outpatient Prescriptions on File Prior to Visit  Medication Sig Dispense Refill  . amLODipine (NORVASC) 5 MG tablet TAKE ONE (1) TABLET BY MOUTH EVERY DAY 90 tablet 1  . aspirin EC 81 MG tablet Take 81 mg by mouth daily.    Marland Kitchen atenolol (TENORMIN) 50 MG tablet TAKE ONE (1) TABLET EACH DAY 90 tablet 3  . cetirizine (ZYRTEC) 5 MG tablet Take 5 mg by mouth as needed for allergies (seasonal).    Marland Kitchen ketoconazole (NIZORAL) 2 % shampoo     . losartan-hydrochlorothiazide (HYZAAR) 100-12.5 MG tablet TAKE ONE (1) TABLET BY MOUTH EVERY DAY 90 tablet 3  . pravastatin (PRAVACHOL) 20 MG tablet TAKE ONE (1) TABLET BY MOUTH EVERY DAY 90 tablet 1   No current  facility-administered medications on file prior to visit.     Social History  Substance Use Topics  . Smoking status: Former Smoker    Types: Cigarettes  . Smokeless tobacco: Never Used  . Alcohol use 0.0 oz/week     Comment: occasionally    Review of Systems  Constitutional: Negative for chills and fever.  Gastrointestinal: Negative for abdominal pain, nausea and vomiting.  Musculoskeletal: Negative for arthralgias and myalgias.  Skin: Positive for rash.  Neurological: Negative for headaches.  Hematological: Negative for adenopathy.      Objective:    BP 134/78 (BP Location: Left Arm, Patient Position: Sitting, Cuff Size: Normal)   Pulse (!) 54   Temp 97.6 F (36.4 C) (Oral)   Resp 16   Wt 134 lb (60.8 kg)   SpO2 98%   BMI 23.74 kg/m    Physical Exam  Constitutional: She appears well-developed and well-nourished.  Eyes: Conjunctivae are normal.  Cardiovascular: Normal rate, regular rhythm, normal heart sounds and normal pulses.   Pulmonary/Chest: Effort normal and breath sounds normal. She has no wheezes. She has no rhonchi. She has no rales.  Neurological: She is alert.  Skin: Skin is warm and dry. Rash noted. Rash is vesicular.     Nontender Localized grouped erythematous  clear filled vesicular lesions noted right lateral malleolus and right lower abdomen. No purulent discharge, streaking.   Psychiatric: She has a normal mood and affect. Her speech is normal and behavior is normal. Thought content normal.  Vitals reviewed.      Assessment & Plan:   1. Irritant contact dermatitis, unspecified trigger Afebrile. No systemic features. Based on pruritus, spreading to abdomen, and vesicular lesions, working diagnosis of contact dermatitis. Will try prescription strength topical steroid. If no improvement, will prescribe oral steroid.  - triamcinolone (KENALOG) 0.025 % cream; Apply 1 application topically 2 (two) times daily.  Dispense: 15 g; Refill: 0    I am  having Ms. Metoyer start on triamcinolone. I am also having her maintain her aspirin EC, cetirizine, ketoconazole, losartan-hydrochlorothiazide, atenolol, amLODipine, and pravastatin.   Meds ordered this encounter  Medications  . triamcinolone (KENALOG) 0.025 % cream    Sig: Apply 1 application topically 2 (two) times daily.    Dispense:  15 g    Refill:  0    Order Specific Question:   Supervising Provider    Answer:   Crecencio Mc [[id number]]    Return precautions given.   Risks, benefits, and alternatives of the medications and treatment plan prescribed today were discussed, and patient expressed understanding.   Education regarding symptom management and diagnosis given to patient on AVS.  Continue to follow with Einar Pheasant, MD for routine health maintenance.   Shelda Jakes Esquer and I agreed with plan.   Mable Paris, FNP

## 2017-01-02 ENCOUNTER — Other Ambulatory Visit: Payer: Self-pay

## 2017-01-02 MED ORDER — LOSARTAN POTASSIUM-HCTZ 100-12.5 MG PO TABS: ORAL_TABLET | ORAL | 1 refills | Status: DC

## 2017-01-04 ENCOUNTER — Other Ambulatory Visit: Payer: Self-pay | Admitting: Internal Medicine

## 2017-01-04 ENCOUNTER — Ambulatory Visit: Payer: Medicare Other | Admitting: Internal Medicine

## 2017-04-06 ENCOUNTER — Other Ambulatory Visit: Payer: Self-pay | Admitting: Internal Medicine

## 2017-06-24 ENCOUNTER — Ambulatory Visit: Payer: Medicare Other

## 2017-07-01 ENCOUNTER — Ambulatory Visit: Payer: Medicare Other

## 2017-07-04 ENCOUNTER — Ambulatory Visit (INDEPENDENT_AMBULATORY_CARE_PROVIDER_SITE_OTHER): Payer: Medicare Other

## 2017-07-04 VITALS — BP 160/86 | HR 79 | Temp 98.3°F | Resp 14 | Ht 63.0 in | Wt 143.8 lb

## 2017-07-04 DIAGNOSIS — Z1231 Encounter for screening mammogram for malignant neoplasm of breast: Secondary | ICD-10-CM | POA: Diagnosis not present

## 2017-07-04 DIAGNOSIS — Z1159 Encounter for screening for other viral diseases: Secondary | ICD-10-CM

## 2017-07-04 DIAGNOSIS — Z23 Encounter for immunization: Secondary | ICD-10-CM

## 2017-07-04 DIAGNOSIS — Z0000 Encounter for general adult medical examination without abnormal findings: Secondary | ICD-10-CM | POA: Diagnosis not present

## 2017-07-04 NOTE — Patient Instructions (Addendum)
  Cheyenne Baker , Thank you for taking time to come for your Medicare Wellness Visit. I appreciate your ongoing commitment to your health goals. Please review the following plan we discussed and let me know if I can assist you in the future.   Follow up with Dr.Scott as needed.    Bring a copy of your [place] and/or Living Will to be scanned into chart.  Monitor blood pressure daily.  Keep a running log until follow up.  I will call you tomorrow.  Have a great day!  These are the goals we discussed: Goals    . Increase physical activity     Walk for exercise        This is a list of the screening recommended for you and due dates:  Health Maintenance  Topic Date Due  . Mammogram  11/06/2017  . Tetanus Vaccine  01/23/2021  . Colon Cancer Screening  12/07/2024  . Flu Shot  Completed  . DEXA scan (bone density measurement)  Completed  .  Hepatitis C: One time screening is recommended by Center for Disease Control  (CDC) for  adults born from [birth year] through 1967.   Completed  . Pneumonia vaccines  Completed

## 2017-07-04 NOTE — Progress Notes (Signed)
Subjective:   Cheyenne Baker is a 67 y.o. female who presents for Medicare Annual (Subsequent) preventive examination.  Review of Systems:  No ROS.  Medicare Wellness Visit. Additional risk factors are reflected in the social history.  Cardiac Risk Factors include: advanced age (>52men, >9 women);hypertension     Objective:     Vitals: BP (!) 160/86 (BP Location: Left Arm, Patient Position: Sitting, Cuff Size: Normal)   Pulse 79   Temp 98.3 F (36.8 C) (Oral)   Resp 14   Ht 5\' 3"  (1.6 m)   Wt 143 lb 12.8 oz (65.2 kg)   SpO2 99%   BMI 25.47 kg/m   Body mass index is 25.47 kg/m.  Advanced Directives 07/04/2017 06/22/2016 12/08/2014 11/29/2014  Does Patient Have a Medical Advance Directive? Yes No No No  Type of Paramedic of [place];Living will - - -  Does patient want to make changes to medical advance directive? No - Patient declined Yes (MAU/Ambulatory/Procedural Areas - Information given) - -  Copy of [place] in Chart? No - copy requested No - copy requested - -    Tobacco Social History   Tobacco Use  Smoking Status Former Smoker  . Types: Cigarettes  Smokeless Tobacco Never Used     Counseling given: Not Answered   Clinical Intake:  Pre-visit preparation completed: Yes  Pain : No/denies pain     Nutritional Status: BMI of 19-24  Normal Diabetes: No  How often do you need to have someone help you when you read instructions, pamphlets, or other written materials from your doctor or pharmacy?: 1 - Never  Interpreter Needed?: No     Past Medical History:  Diagnosis Date  . Allergy   . Hyperlipidemia   . Hypertension    Past Surgical History:  Procedure Laterality Date  . ABDOMINAL HYSTERECTOMY  1980   ovaries not removed, secondary to bleeding  . CHOLECYSTECTOMY  1995  . COLONOSCOPY    . TONSILLECTOMY AND ADENOIDECTOMY  1975   Family History  Problem Relation Age of Onset  . Hypertension Mother    . Hyperlipidemia Mother   . Heart disease Father   . Hypertension Father   . Colon cancer Neg Hx   . Colon polyps Neg Hx   . Rectal cancer Neg Hx   . Esophageal cancer Neg Hx   . Stomach cancer Neg Hx    Social History   Socioeconomic History  . Marital status: Widowed    Spouse name: None  . Number of children: None  . Years of education: None  . Highest education level: None  Social Needs  . Financial resource strain: None  . Food insecurity - worry: None  . Food insecurity - inability: None  . Transportation needs - medical: None  . Transportation needs - non-medical: None  Occupational History  . None  Tobacco Use  . Smoking status: Former Smoker    Types: Cigarettes  . Smokeless tobacco: Never Used  Substance and Sexual Activity  . Alcohol use: Yes    Alcohol/week: 0.0 oz    Comment: occasionally  . Drug use: No  . Sexual activity: Yes  Other Topics Concern  . None  Social History Narrative  . None    Outpatient Encounter Medications as of 07/04/2017  Medication Sig  . amLODipine (NORVASC) 5 MG tablet TAKE ONE TABLET BY MOUTH EVERY DAY  . aspirin EC 81 MG tablet Take 81 mg by mouth daily.  Marland Kitchen  atenolol (TENORMIN) 50 MG tablet TAKE ONE TABLET BY MOUTH EVERY DAY  . losartan-hydrochlorothiazide (HYZAAR) 100-12.5 MG tablet TAKE ONE TABLET EVERY DAY  . pravastatin (PRAVACHOL) 20 MG tablet TAKE ONE TABLET BY MOUTH EVERY DAY  . [DISCONTINUED] cetirizine (ZYRTEC) 5 MG tablet Take 5 mg by mouth as needed for allergies (seasonal).  . [DISCONTINUED] ketoconazole (NIZORAL) 2 % shampoo   . [DISCONTINUED] triamcinolone (KENALOG) 0.025 % cream Apply 1 application topically 2 (two) times daily.   No facility-administered encounter medications on file as of 07/04/2017.     Activities of Daily Living In your present state of health, do you have any difficulty performing the following activities: 07/04/2017  Hearing? N  Vision? N  Difficulty concentrating or making  decisions? N  Walking or climbing stairs? N  Dressing or bathing? N  Doing errands, shopping? N  Preparing Food and eating ? N  Using the Toilet? N  In the past six months, have you accidently leaked urine? N  Do you have problems with loss of bowel control? N  Managing your Medications? N  Managing your Finances? N  Housekeeping or managing your Housekeeping? N  Some recent data might be hidden    Patient Care Team: Einar Pheasant, MD as PCP - General (Internal Medicine)    Assessment:   This is a routine wellness examination for [name]. The goal of the wellness visit is to assist the patient how to close the gaps in care and create a preventative care plan for the patient.   The roster of all physicians providing medical care to patient is listed in the Snapshot section of the chart.  Taking calcium VIT D as appropriate/Osteoporosis risk reviewed.    Safety issues reviewed; Security door and smoke and carbon monoxide detectors in the home. No firearms in the home.  Wears seatbelts when driving or riding with others. Patient does wear sunscreen or protective clothing when in direct sunlight. No violence in the home.  Depression- PHQ 2 &9 complete.  No signs/symptoms or verbal communication regarding little pleasure in doing things, feeling down, depressed or hopeless. No changes in sleeping, energy, eating, concentrating.  No thoughts of self harm or harm towards others.  She has some sadness surrounding the loss of her spouse 1 year ago but feels she is doing well over all. Time spent on this topic is 15 minutes.   Patient is alert, normal appearance, oriented to person/place/and time.  Correctly identified the president of the [place], recall of 3/3 words, and performing simple calculations. Displays appropriate judgement and can read correct time from watch face.   No new identified risk were noted.  No failures at ADL's or IADL's.    BMI- discussed the importance of a healthy  diet, water intake and the benefits of aerobic exercise. Educational material provided.   24 hour diet recall: Breakfast: toast Lunch: soup Dinner: pork loin, green vegetable  Daily fluid intake: 0 cups of caffeine, 8 cups of water  Dental- every 12 months.  Dr Loura Back.  Eye- Visual acuity not assessed per patient preference since they have regular follow up with the ophthalmologist.  Wears corrective lenses.  Sleep patterns- Sleeps 8 hours at night.  Wakes feeling rested.  High dose influenza administered L deltoid, tolerated well. Educational material provided.  Hepatitis C Screening completed; follow as directed.  Educational material provided.  Health maintenance gaps- closed.  HTN; followed by PCP.  Taking medications as prescribed.  Taken x2 today with reading 160/86. No  headaches, chest pain, blurred vision.  Follow up declined at this time.  Encouraged to monitor at home. Will follow up with a phone call tomorrow to check the readings and confirm no new symptoms present.    Exercise Activities and Dietary recommendations Current Exercise Habits: The patient does not participate in regular exercise at present  Goals    . Increase physical activity     Walk for exercise        Fall Risk Fall Risk  07/04/2017 06/22/2016 10/04/2015 09/28/2014 10/15/2013  Falls in the past year? No No No No No    Depression Screen PHQ 2/9 Scores 07/04/2017 06/22/2016 10/04/2015 09/28/2014  PHQ - 2 Score 0 0 0 0  PHQ- 9 Score 0 - - -     Cognitive Function MMSE - Mini Mental State Exam 07/04/2017 06/22/2016  Orientation to time 5 5  Orientation to Place 5 5  Registration 3 3  Attention/ Calculation 5 5  Recall 3 3  Language- name 2 objects 2 2  Language- repeat 1 1  Language- follow 3 step command 3 3  Language- read & follow direction 1 1  Write a sentence 1 1  Copy design 1 1  Total score 30 30        Immunization History  Administered Date(s) Administered  . Influenza, High  Dose Seasonal PF 06/22/2016, 07/04/2017  . Pneumococcal Conjugate-13 09/28/2014  . Pneumococcal Polysaccharide-23 10/04/2016  . Zoster 10/26/2013    Screening Tests Health Maintenance  Topic Date Due  . MAMMOGRAM  11/06/2017  . TETANUS/TDAP  01/23/2021  . COLONOSCOPY  12/07/2024  . INFLUENZA VACCINE  Completed  . DEXA SCAN  Completed  . Hepatitis C Screening  Completed  . PNA vac Low Risk Adult  Completed       Plan:    End of life planning; Advance aging; Advanced directives discussed. Copy of current HCPOA/Living Will requested.    I have personally reviewed and noted the following in the patient's chart:   . Medical and social history . Use of alcohol, tobacco or illicit drugs  . Current medications and supplements . Functional ability and status . Nutritional status . Physical activity . Advanced directives . List of other physicians . Hospitalizations, surgeries, and ER visits in previous 12 months . Vitals . Screenings to include cognitive, depression, and falls . Referrals and appointments  In addition, I have reviewed and discussed with patient certain preventive protocols, quality metrics, and [place] practice recommendations. A written personalized care plan for preventive services as well as general preventive health recommendations were provided to patient.     Varney Biles, LPN  09/07/4006   Reviewed.  Dr Nicki Reaper

## 2017-07-05 LAB — HEPATITIS C ANTIBODY
HEP C AB: NONREACTIVE
SIGNAL TO CUT-OFF: 0.02 (ref <1.00)

## 2017-07-07 ENCOUNTER — Encounter: Payer: Self-pay | Admitting: Internal Medicine

## 2017-07-30 ENCOUNTER — Encounter: Payer: Self-pay | Admitting: Internal Medicine

## 2017-09-30 ENCOUNTER — Other Ambulatory Visit: Payer: Self-pay | Admitting: Internal Medicine

## 2017-09-30 ENCOUNTER — Telehealth: Payer: Self-pay | Admitting: Internal Medicine

## 2017-09-30 MED ORDER — PRAVASTATIN SODIUM 20 MG PO TABS: 20.0000 mg | ORAL_TABLET | Freq: Every day | ORAL | 0 refills | Status: DC

## 2017-09-30 NOTE — Telephone Encounter (Signed)
Copied from [place]. Topic: Quick Communication - Rx Refill/Question >> Sep 30, 2017 11:37 AM Synthia Innocent wrote: Medication: pravastatin (PRAVACHOL) 20 MG tablet  Has the patient contacted their pharmacy? Yes.   (Agent: If no, request that the patient contact the pharmacy for the refill.) Preferred Pharmacy (with phone number or street name): [facility] Agent: Please be advised that RX refills may take up to 3 business days. We ask that you follow-up with your pharmacy.

## 2017-10-07 ENCOUNTER — Encounter: Payer: Self-pay | Admitting: Internal Medicine

## 2017-10-07 ENCOUNTER — Ambulatory Visit (INDEPENDENT_AMBULATORY_CARE_PROVIDER_SITE_OTHER): Payer: Medicare Other | Admitting: Internal Medicine

## 2017-10-07 VITALS — BP 120/78 | HR 65 | Temp 98.2°F | Resp 18 | Ht 63.0 in | Wt 140.6 lb

## 2017-10-07 DIAGNOSIS — I10 Essential (primary) hypertension: Secondary | ICD-10-CM

## 2017-10-07 DIAGNOSIS — Z1231 Encounter for screening mammogram for malignant neoplasm of breast: Secondary | ICD-10-CM

## 2017-10-07 DIAGNOSIS — Z0000 Encounter for general adult medical examination without abnormal findings: Secondary | ICD-10-CM | POA: Diagnosis not present

## 2017-10-07 DIAGNOSIS — E049 Nontoxic goiter, unspecified: Secondary | ICD-10-CM

## 2017-10-07 DIAGNOSIS — E7800 Pure hypercholesterolemia, unspecified: Secondary | ICD-10-CM

## 2017-10-07 DIAGNOSIS — F439 Reaction to severe stress, unspecified: Secondary | ICD-10-CM

## 2017-10-07 DIAGNOSIS — Z1239 Encounter for other screening for malignant neoplasm of breast: Secondary | ICD-10-CM

## 2017-10-07 DIAGNOSIS — R739 Hyperglycemia, unspecified: Secondary | ICD-10-CM | POA: Diagnosis not present

## 2017-10-07 LAB — BASIC METABOLIC PANEL
BUN: 17 mg/dL (ref 6–23)
CHLORIDE: 102 meq/L (ref 96–112)
CO2: 27 meq/L (ref 19–32)
CREATININE: 1.01 mg/dL (ref 0.40–1.20)
Calcium: 10.2 mg/dL (ref 8.4–10.5)
GFR: 57.74 mL/min — ABNORMAL LOW (ref >60.00)
GLUCOSE: 102 mg/dL — AB (ref 70–99)
Potassium: 3.3 mEq/L — ABNORMAL LOW (ref 3.5–5.1)
Sodium: 139 mEq/L (ref 135–145)

## 2017-10-07 LAB — CBC WITH DIFFERENTIAL/PLATELET
BASOS ABS: 0 10*3/uL (ref 0.0–0.1)
Basophils Relative: 0.4 % (ref 0.0–3.0)
EOS PCT: 0.5 % (ref 0.0–5.0)
Eosinophils Absolute: 0.1 10*3/uL (ref 0.0–0.7)
HCT: 41.5 % (ref 36.0–46.0)
Hemoglobin: 14.5 g/dL (ref 12.0–15.0)
LYMPHS ABS: 1.3 10*3/uL (ref 0.7–4.0)
Lymphocytes Relative: 12.6 % (ref 12.0–46.0)
MCHC: 34.9 g/dL (ref 30.0–36.0)
MCV: 90.5 fl (ref 78.0–100.0)
MONO ABS: 0.5 10*3/uL (ref 0.1–1.0)
Monocytes Relative: 4.6 % (ref 3.0–12.0)
NEUTROS PCT: 81.9 % — AB (ref 43.0–77.0)
Neutro Abs: 8.6 10*3/uL — ABNORMAL HIGH (ref 1.4–7.7)
Platelets: 352 10*3/uL (ref 150.0–400.0)
RBC: 4.59 Mil/uL (ref 3.87–5.11)
RDW: 13.2 % (ref 11.5–15.5)
WBC: 10.5 10*3/uL (ref 4.0–10.5)

## 2017-10-07 LAB — HEPATIC FUNCTION PANEL
ALT: 17 U/L (ref 0–35)
AST: 18 U/L (ref 0–37)
Albumin: 4.3 g/dL (ref 3.5–5.2)
Alkaline Phosphatase: 43 U/L (ref 39–117)
BILIRUBIN TOTAL: 0.8 mg/dL (ref 0.2–1.2)
Bilirubin, Direct: 0.1 mg/dL (ref 0.0–0.3)
Total Protein: 7.6 g/dL (ref 6.0–8.3)

## 2017-10-07 LAB — HEMOGLOBIN A1C: HEMOGLOBIN A1C: 5.4 % (ref 4.6–6.5)

## 2017-10-07 LAB — LIPID PANEL
CHOLESTEROL: 184 mg/dL (ref 0–200)
HDL: 59 mg/dL (ref >39.00)
LDL Cholesterol: 107 mg/dL — ABNORMAL HIGH (ref 0–99)
NONHDL: 124.85
Total CHOL/HDL Ratio: 3
Triglycerides: 89 mg/dL (ref 0.0–149.0)
VLDL: 17.8 mg/dL (ref 0.0–40.0)

## 2017-10-07 LAB — TSH: TSH: 1.08 u[IU]/mL (ref 0.35–4.50)

## 2017-10-07 NOTE — Assessment & Plan Note (Addendum)
Physical today 10/07/17.  Mammogram 11/06/16 - Birads I.  Colonoscopy 12/08/14 - mild diverticulosis.  Recommended f/u colonoscopy in 10 years (Dr Hilarie Fredrickson).  Is s/p hysterectomy.  Schedule f/u mammogram. Discussed shingrix.

## 2017-10-07 NOTE — Progress Notes (Signed)
Patient ID: Cheyenne Baker, female   DOB: [birth year], 67 y.o.   MRN: [id number]   Subjective:    Patient ID: Cheyenne Baker, female    DOB: 1951/04/10, 67 y.o.   MRN: [id number]  HPI  Patient with past history of hypercholesterolemia and hypertension.  She comes in today to follow up on these issues as well as for a complete physical exam.  She reports she is doing well.  Feels good.  Tries to stay active.  No chest pain.  No sob.  No acid reflux. No abdominal pain.  Bowels moving.  Still trying to cope with her husband's death.  Has good support.  Discussed with her today.  She does not feel needs any further intervention.     Past Medical History:  Diagnosis Date  . Allergy   . Hyperlipidemia   . Hypertension    Past Surgical History:  Procedure Laterality Date  . ABDOMINAL HYSTERECTOMY  1980   ovaries not removed, secondary to bleeding  . CHOLECYSTECTOMY  1995  . COLONOSCOPY    . TONSILLECTOMY AND ADENOIDECTOMY  1975   Family History  Problem Relation Age of Onset  . Hypertension Mother   . Hyperlipidemia Mother   . Heart disease Father   . Hypertension Father   . Colon cancer Neg Hx   . Colon polyps Neg Hx   . Rectal cancer Neg Hx   . Esophageal cancer Neg Hx   . Stomach cancer Neg Hx    Social History   Socioeconomic History  . Marital status: Widowed    Spouse name: Not on file  . Number of children: Not on file  . Years of education: Not on file  . Highest education level: Not on file  Occupational History  . Not on file  Social Needs  . Financial resource strain: Not on file  . Food insecurity:    Worry: Not on file    Inability: Not on file  . Transportation needs:    Medical: Not on file    Non-medical: Not on file  Tobacco Use  . Smoking status: Former Smoker    Types: Cigarettes  . Smokeless tobacco: Never Used  Substance and Sexual Activity  . Alcohol use: Yes    Alcohol/week: 0.0 oz    Comment: occasionally  . Drug use: No  . Sexual activity:  Yes  Lifestyle  . Physical activity:    Days per week: Not on file    Minutes per session: Not on file  . Stress: Not on file  Relationships  . Social connections:    Talks on phone: Not on file    Gets together: Not on file    Attends religious service: Not on file    Active member of club or organization: Not on file    Attends meetings of clubs or organizations: Not on file    Relationship status: Not on file  Other Topics Concern  . Not on file  Social History Narrative  . Not on file    Outpatient Encounter Medications as of 10/07/2017  Medication Sig  . amLODipine (NORVASC) 5 MG tablet TAKE ONE TABLET BY MOUTH EVERY DAY  . aspirin EC 81 MG tablet Take 81 mg by mouth daily.  Marland Kitchen atenolol (TENORMIN) 50 MG tablet TAKE ONE TABLET BY MOUTH EVERY DAY  . losartan-hydrochlorothiazide (HYZAAR) 100-12.5 MG tablet TAKE ONE TABLET EVERY DAY  . pravastatin (PRAVACHOL) 20 MG tablet Take 1 tablet (20 mg total) by mouth  daily.   No facility-administered encounter medications on file as of 10/07/2017.     Review of Systems  Constitutional: Negative for appetite change and unexpected weight change.       Eating better.    HENT: Negative for congestion and sinus pressure.   Eyes: Negative for pain and visual disturbance.  Respiratory: Negative for cough, chest tightness and shortness of breath.   Cardiovascular: Negative for chest pain, palpitations and leg swelling.  Gastrointestinal: Negative for abdominal pain, diarrhea, nausea and vomiting.  Genitourinary: Negative for difficulty urinating and dysuria.  Musculoskeletal: Negative for joint swelling and myalgias.  Skin: Negative for color change and rash.  Neurological: Negative for dizziness, light-headedness and headaches.  Hematological: Negative for adenopathy. Does not bruise/bleed easily.  Psychiatric/Behavioral: Negative for agitation and dysphoric mood.       Increased stress in coping with her husband's death.          Objective:    Physical Exam  Constitutional: She is oriented to person, place, and time. She appears well-developed and well-nourished. No distress.  HENT:  Nose: Nose normal.  Mouth/Throat: Oropharynx is clear and moist.  Eyes: Right eye exhibits no discharge. Left eye exhibits no discharge. No scleral icterus.  Neck: Neck supple. Thyromegaly present.  Cardiovascular: Normal rate and regular rhythm.  Pulmonary/Chest: Breath sounds normal. No accessory muscle usage. No tachypnea. No respiratory distress. She has no decreased breath sounds. She has no wheezes. She has no rhonchi. Right breast exhibits no inverted nipple, no mass, no nipple discharge and no tenderness (no axillary adenopathy). Left breast exhibits no inverted nipple, no mass, no nipple discharge and no tenderness (no axilarry adenopathy).  Abdominal: Soft. Bowel sounds are normal. There is no tenderness.  Musculoskeletal: She exhibits no edema or tenderness.  Lymphadenopathy:    She has no cervical adenopathy.  Neurological: She is alert and oriented to person, place, and time.  Skin: Skin is warm. No rash noted.  Psychiatric: She has a normal mood and affect. Her behavior is normal.    BP 120/78 (BP Location: Left Arm, Patient Position: Sitting, Cuff Size: Normal)   Pulse 65   Temp 98.2 F (36.8 C) (Oral)   Resp 18   Ht 5\' 3"  (1.6 m)   Wt 140 lb 9.6 oz (63.8 kg)   SpO2 99%   BMI 24.91 kg/m  Wt Readings from Last 3 Encounters:  10/07/17 140 lb 9.6 oz (63.8 kg)  07/04/17 143 lb 12.8 oz (65.2 kg)  11/13/16 134 lb (60.8 kg)     Lab Results  Component Value Date   WBC 10.5 10/07/2017   HGB 14.5 10/07/2017   HCT 41.5 10/07/2017   PLT 352.0 10/07/2017   GLUCOSE 102 (H) 10/07/2017   CHOL 184 10/07/2017   TRIG 89.0 10/07/2017   HDL 59.00 10/07/2017   LDLDIRECT 143.3 06/11/2013   LDLCALC 107 (H) 10/07/2017   ALT 17 10/07/2017   AST 18 10/07/2017   NA 139 10/07/2017   K 3.3 (L) 10/07/2017   CL 102  10/07/2017   CREATININE 1.01 10/07/2017   BUN 17 10/07/2017   CO2 27 10/07/2017   TSH 1.08 10/07/2017   HGBA1C 5.4 10/07/2017    Mm Screening Breast Tomo Bilateral  Result Date: 11/06/2016 CLINICAL DATA:  Screening. EXAM: 2D DIGITAL SCREENING BILATERAL MAMMOGRAM WITH CAD AND ADJUNCT TOMO COMPARISON:  Previous exam(s). ACR Breast Density Category c: The breast tissue is heterogeneously dense, which may obscure small masses. FINDINGS: There are no findings suspicious  for malignancy. Images were processed with CAD. IMPRESSION: No mammographic evidence of malignancy. A result letter of this screening mammogram will be mailed directly to the patient. RECOMMENDATION: Screening mammogram in one year. (Code:SM-B-01Y) BI-RADS CATEGORY  1: Negative. Electronically Signed   By: Abelardo Diesel M.D.   On: 11/06/2016 12:26       Assessment & Plan:   Problem List Items Addressed This Visit    Enlarged thyroid    Has been followed by ENT and Dr Gabriel Carina.  Biopsy revealed benign colloid nodule.  Has been released.  Follow tsh.       Relevant Orders   TSH (Completed)   Essential hypertension, benign    Blood pressure under good control.  Continue same medication regimen.  Follow pressures.  Follow metabolic panel.        Relevant Orders   CBC with Differential/Platelet (Completed)   Basic metabolic panel (Completed)   Health care maintenance    Physical today 10/07/17.  Mammogram 11/06/16 - Birads I.  Colonoscopy 12/08/14 - mild diverticulosis.  Recommended f/u colonoscopy in 10 years (Dr Hilarie Fredrickson).  Is s/p hysterectomy.  Schedule f/u mammogram. Discussed shingrix.       Hypercholesterolemia    On pravastatin.  Low cholesterol diet and exercise.  Follow lipid panel and liver function tests.        Relevant Orders   Hepatic function panel (Completed)   Lipid panel (Completed)   Stress    Increased stress in trying to cope with her husband's death.  Does not feel needs anything more at this time.   Follow.        Other Visit Diagnoses    Breast cancer screening    -  Primary   Relevant Orders   MM DIGITAL SCREENING BILATERAL   Hyperglycemia       Relevant Orders   Hemoglobin A1c (Completed)       Einar Pheasant, MD;

## 2017-10-08 ENCOUNTER — Other Ambulatory Visit: Payer: Self-pay | Admitting: Internal Medicine

## 2017-10-08 DIAGNOSIS — E876 Hypokalemia: Secondary | ICD-10-CM

## 2017-10-08 NOTE — Progress Notes (Signed)
Order placed for f/u lab.   

## 2017-10-09 ENCOUNTER — Encounter: Payer: Self-pay | Admitting: Internal Medicine

## 2017-10-09 NOTE — Assessment & Plan Note (Signed)
Has been followed by ENT and Dr Gabriel Carina.  Biopsy revealed benign colloid nodule.  Has been released.  Follow tsh.

## 2017-10-09 NOTE — Assessment & Plan Note (Signed)
Blood pressure under good control.  Continue same medication regimen.  Follow pressures.  Follow metabolic panel.   

## 2017-10-09 NOTE — Assessment & Plan Note (Signed)
Increased stress in trying to cope with her husband's death.  Does not feel needs anything more at this time.  Follow.

## 2017-10-09 NOTE — Assessment & Plan Note (Signed)
On pravastatin.  Low cholesterol diet and exercise.  Follow lipid panel and liver function tests.   

## 2017-10-15 ENCOUNTER — Other Ambulatory Visit: Payer: Self-pay | Admitting: Internal Medicine

## 2017-10-18 ENCOUNTER — Other Ambulatory Visit (INDEPENDENT_AMBULATORY_CARE_PROVIDER_SITE_OTHER): Payer: Medicare Other

## 2017-10-18 DIAGNOSIS — E876 Hypokalemia: Secondary | ICD-10-CM

## 2017-10-18 LAB — BASIC METABOLIC PANEL
BUN: 16 mg/dL (ref 6–23)
CHLORIDE: 103 meq/L (ref 96–112)
CO2: 28 meq/L (ref 19–32)
Calcium: 9.9 mg/dL (ref 8.4–10.5)
Creatinine, Ser: 0.95 mg/dL (ref 0.40–1.20)
GFR: 61.96 mL/min (ref >60.00)
GLUCOSE: 98 mg/dL (ref 70–99)
POTASSIUM: 3.8 meq/L (ref 3.5–5.1)
Sodium: 140 mEq/L (ref 135–145)

## 2017-10-21 ENCOUNTER — Encounter: Payer: Self-pay | Admitting: Internal Medicine

## 2017-10-28 ENCOUNTER — Other Ambulatory Visit: Payer: Self-pay | Admitting: Internal Medicine

## 2017-11-07 ENCOUNTER — Ambulatory Visit
Admission: RE | Admit: 2017-11-07 | Discharge: 2017-11-07 | Disposition: A | Discharge status: Home / Self Care | Payer: Medicare Other | Source: Ambulatory Visit | Attending: Internal Medicine | Admitting: Internal Medicine

## 2017-11-07 DIAGNOSIS — Z1239 Encounter for other screening for malignant neoplasm of breast: Secondary | ICD-10-CM

## 2017-11-07 DIAGNOSIS — Z1231 Encounter for screening mammogram for malignant neoplasm of breast: Secondary | ICD-10-CM | POA: Insufficient documentation

## 2017-12-25 ENCOUNTER — Other Ambulatory Visit: Payer: Self-pay | Admitting: Internal Medicine

## 2018-01-27 ENCOUNTER — Other Ambulatory Visit: Payer: Self-pay | Admitting: Internal Medicine

## 2018-03-27 ENCOUNTER — Other Ambulatory Visit: Payer: Self-pay | Admitting: Internal Medicine

## 2018-04-16 DIAGNOSIS — R002 Palpitations: Secondary | ICD-10-CM | POA: Diagnosis not present

## 2018-04-17 ENCOUNTER — Other Ambulatory Visit: Payer: Self-pay

## 2018-04-17 ENCOUNTER — Emergency Department
Admission: EM | Admit: 2018-04-17 | Discharge: 2018-04-17 | Disposition: A | Discharge status: Home / Self Care | Payer: Medicare Other | Attending: Emergency Medicine | Admitting: Emergency Medicine

## 2018-04-17 DIAGNOSIS — I10 Essential (primary) hypertension: Secondary | ICD-10-CM | POA: Insufficient documentation

## 2018-04-17 DIAGNOSIS — Z79899 Other long term (current) drug therapy: Secondary | ICD-10-CM | POA: Insufficient documentation

## 2018-04-17 DIAGNOSIS — R002 Palpitations: Secondary | ICD-10-CM | POA: Diagnosis not present

## 2018-04-17 DIAGNOSIS — Z7982 Long term (current) use of aspirin: Secondary | ICD-10-CM | POA: Insufficient documentation

## 2018-04-17 LAB — COMPREHENSIVE METABOLIC PANEL
ALT: 18 U/L (ref 0–44)
AST: 23 U/L (ref 15–41)
Albumin: 4.5 g/dL (ref 3.5–5.0)
Alkaline Phosphatase: 45 U/L (ref 38–126)
Anion gap: 5 (ref 5–15)
BUN: 19 mg/dL (ref 8–23)
CHLORIDE: 104 mmol/L (ref 98–111)
CO2: 31 mmol/L (ref 22–32)
CREATININE: 0.93 mg/dL (ref 0.44–1.00)
Calcium: 9.7 mg/dL (ref 8.9–10.3)
GFR calc Af Amer: >60 mL/min (ref >60)
GFR calc non Af Amer: >60 mL/min (ref >60)
GLUCOSE: 143 mg/dL — AB (ref 70–99)
Potassium: 3.4 mmol/L — ABNORMAL LOW (ref 3.5–5.1)
SODIUM: 140 mmol/L (ref 135–145)
Total Bilirubin: 0.7 mg/dL (ref 0.3–1.2)
Total Protein: 7.9 g/dL (ref 6.5–8.1)

## 2018-04-17 LAB — CBC WITH DIFFERENTIAL/PLATELET
ABS IMMATURE GRANULOCYTES: 0.03 10*3/uL (ref 0.00–0.07)
BASOS PCT: 1 %
Basophils Absolute: 0 10*3/uL (ref 0.0–0.1)
Eosinophils Absolute: 0.1 10*3/uL (ref 0.0–0.5)
Eosinophils Relative: 2 %
HCT: 41.3 % (ref 36.0–46.0)
Hemoglobin: 14.2 g/dL (ref 12.0–15.0)
IMMATURE GRANULOCYTES: 0 %
LYMPHS PCT: 22 %
Lymphs Abs: 1.8 10*3/uL (ref 0.7–4.0)
MCH: 31.1 pg (ref 26.0–34.0)
MCHC: 34.4 g/dL (ref 30.0–36.0)
MCV: 90.6 fL (ref 80.0–100.0)
MONOS PCT: 7 %
Monocytes Absolute: 0.6 10*3/uL (ref 0.1–1.0)
NEUTROS ABS: 5.8 10*3/uL (ref 1.7–7.7)
NEUTROS PCT: 68 %
PLATELETS: 310 10*3/uL (ref 150–400)
RBC: 4.56 MIL/uL (ref 3.87–5.11)
RDW: 12.8 % (ref 11.5–15.5)
WBC: 8.4 10*3/uL (ref 4.0–10.5)
nRBC: 0 % (ref 0.0–0.2)

## 2018-04-17 LAB — MAGNESIUM: Magnesium: 2.4 mg/dL (ref 1.7–2.4)

## 2018-04-17 LAB — TROPONIN I

## 2018-04-17 NOTE — ED Provider Notes (Signed)
[facility] Emergency Department Provider Note  ____________________________________________   First MD Initiated Contact with Patient 04/17/18 0134     (approximate)  I have reviewed the triage vital signs and the nursing notes.   HISTORY  Chief Complaint Hypertension and Palpitations    HPI Cheyenne Baker is a 67 y.o. female with medical history as listed below who presents for evaluation of palpitations.  She reports that earlier tonight she felt palpitations and when she checked her blood pressure is extremely elevated.  EMS was called and reportedly the blood pressure was 240/116 for the fire department.  EMS reported that her blood pressure was 179/88.  She was also hypertensive when she first arrived to the emergency department but now her blood pressure has been in the 140s over 70s and she has no symptoms.  She does say that she was anxious when her she was feeling palpitations.  She denies chest pain or shortness of breath.  She has not had any recent illnesses and no medication changes.  She is not had any episodes in the past which she is aware of SVT or other cardiac arrhythmia.  She is currently asymptomatic and asking to go home.  The onset was acute, symptoms were severe, nothing in particular made it better or worse but they seem to have resolved.  She does report that she did not take her blood pressure medicine this morning but she took her evening dose and perhaps that medication has kicked in.  Past Medical History:  Diagnosis Date  . Allergy   . Hyperlipidemia   . Hypertension     Patient Active Problem List   Diagnosis Date Noted  . Stress 10/07/2016  . GERD (gastroesophageal reflux disease) 10/01/2014  . Skin lesion of breast 10/01/2014  . Health care maintenance 10/01/2014  . Environmental allergies 10/01/2014  . Essential hypertension, benign 12/21/2012  . Hypercholesterolemia 12/21/2012  . Syncope 12/21/2012  . Enlarged thyroid  12/21/2012    Past Surgical History:  Procedure Laterality Date  . ABDOMINAL HYSTERECTOMY  1980   ovaries not removed, secondary to bleeding  . CHOLECYSTECTOMY  1995  . COLONOSCOPY    . TONSILLECTOMY AND ADENOIDECTOMY  1975    Prior to Admission medications   Medication Sig Start Date End Date Taking? Authorizing Provider  amLODipine (NORVASC) 5 MG tablet TAKE ONE TABLET EVERY DAY 12/27/17   Einar Pheasant, MD  aspirin EC 81 MG tablet Take 81 mg by mouth daily.    [provider]  atenolol (TENORMIN) 50 MG tablet TAKE ONE TABLET EVERY DAY 03/27/18   Einar Pheasant, MD  losartan-hydrochlorothiazide [facility]) 100-12.5 MG tablet TAKE ONE TABLET EVERY DAY 03/27/18   Einar Pheasant, MD  pravastatin (PRAVACHOL) 20 MG tablet TAKE ONE TABLET BY MOUTH EVERY DAY 01/27/18   Einar Pheasant, MD    Allergies Patient has no known allergies.  Family History  Problem Relation Age of Onset  . Hypertension Mother   . Hyperlipidemia Mother   . Heart disease Father   . Hypertension Father   . Colon cancer Neg Hx   . Colon polyps Neg Hx   . Rectal cancer Neg Hx   . Esophageal cancer Neg Hx   . Stomach cancer Neg Hx   . Breast cancer Neg Hx     Social History Social History   Tobacco Use  . Smoking status: Former Smoker    Types: Cigarettes  . Smokeless tobacco: Never Used  Substance Use Topics  .  Alcohol use: Yes    Alcohol/week: 0.0 standard drinks    Comment: occasionally  . Drug use: No    Review of Systems Constitutional: No fever/chills Eyes: No visual changes. ENT: No sore throat. Cardiovascular: Denies chest pain.  Positive for palpitations. Respiratory: Denies shortness of breath. Gastrointestinal: No abdominal pain.  No nausea, no vomiting.  No diarrhea.  No constipation. Genitourinary: Negative for dysuria. Musculoskeletal: Negative for neck pain.  Negative for back pain. Integumentary: Negative for rash. Neurological: Negative for headaches, focal weakness or  numbness.   ____________________________________________   PHYSICAL EXAM:  VITAL SIGNS: ED Triage Vitals [04/17/18 0051]  Enc Vitals Group     BP (!) 193/95     Pulse Rate (!) 106     Resp 16     Temp 98.2 F (36.8 C)     Temp Source Oral     SpO2 97 %     Weight 63.5 kg (140 lb)     Height 1.651 m (5\' 5" )     Head Circumference      Peak Flow      Pain Score 0     Pain Loc      Pain Edu?      Excl. in [place]?     Constitutional: Alert and oriented. Well appearing and in no acute distress. Eyes: Conjunctivae are normal.  Head: Atraumatic. Nose: No congestion/rhinnorhea. Mouth/Throat: Mucous membranes are moist. Neck: No stridor.  No meningeal signs.   Cardiovascular: Normal rate, regular rhythm. Good peripheral circulation. Grossly normal heart sounds. Respiratory: Normal respiratory effort.  No retractions. Lungs CTAB. Gastrointestinal: Soft and nontender. No distention.  Musculoskeletal: No lower extremity tenderness nor edema. No gross deformities of extremities. Neurologic:  Normal speech and language. No gross focal neurologic deficits are appreciated.  Skin:  Skin is warm, dry and intact. No rash noted. Psychiatric: Mood and affect are normal. Speech and behavior are normal.  ____________________________________________   LABS (all labs ordered are listed, but only abnormal results are displayed)  Labs Reviewed  COMPREHENSIVE METABOLIC PANEL - Abnormal; Notable for the following components:      Result Value   Potassium 3.4 (*)    Glucose, Bld 143 (*)    All other components within normal limits  CBC WITH DIFFERENTIAL/PLATELET  MAGNESIUM  TROPONIN I   ____________________________________________  EKG  ED ECG REPORT I, Hinda Kehr, the attending physician, personally viewed and interpreted this ECG.  Date: 04/17/2018 EKG Time: 1:32 AM Rate: 90 Rhythm: normal sinus rhythm QRS Axis: normal Intervals: LVH ST/T Wave abnormalities: Non-specific ST  segment / T-wave changes, but no evidence of acute ischemia. Narrative Interpretation: no evidence of acute ischemia   ____________________________________________  RADIOLOGY   ED MD interpretation: No indication for chest x-ray  Official radiology report(s): No results found.  ____________________________________________   PROCEDURES  Critical Care performed: No   Procedure(s) performed:   Procedures   ____________________________________________   INITIAL IMPRESSION / ASSESSMENT AND PLAN / ED COURSE  As part of my medical decision making, I reviewed the following data within the [place] History obtained from family, Nursing notes reviewed and incorporated, Labs reviewed , EKG interpreted  and Old chart reviewed    Differential diagnosis includes, but is not limited to, SVT, atrial fibrillation, electrolyte abnormality, ACS, pulmonary embolism.  The patient has been asymptomatic since coming to the emergency department and she was immediately sent to go home.  EKG is normal and her labs are essentially normal except  for very slightly decreased potassium which does not account for any acute cardiac abnormalities.  The patient has had no chest pain and is low risk for ACS.  She was very adamant that she wanted to go home once the lab work came back and I think that is appropriate.  She will follow-up with her regular doctor and I gave my usual customary return precautions  Clinical Course as of Apr 17 545  Thu Apr 17, 2018  0251 Patient asymptomatic    [CF]    Clinical Course User Index [CF] Hinda Kehr, MD    ____________________________________________  FINAL CLINICAL IMPRESSION(S) / ED DIAGNOSES  Final diagnoses:  Palpitations     MEDICATIONS GIVEN DURING THIS VISIT:  Medications - No data to display   ED Discharge Orders    None       Note:  This document was prepared using Dragon voice recognition software and may include  unintentional dictation errors.    Hinda Kehr, MD 04/17/18 [phone]

## 2018-04-17 NOTE — ED Triage Notes (Signed)
Pt arrived via [facility] from home with c/o palpitations and HTN. EMS states was called due to patient feeling palpitations and when fire department took BP it was 240/116. EMS VS were 179/88. PT states no CP nor N/V.

## 2018-04-17 NOTE — Discharge Instructions (Signed)
Your workup in the Emergency Department today was reassuring.  We did not find any specific abnormalities.  We recommend you drink plenty of fluids, take your regular medications and/or any new ones prescribed today, and follow up with the doctor(s) listed in these documents as recommended.  Return to the Emergency Department if you develop new or worsening symptoms that concern you.  

## 2018-04-18 ENCOUNTER — Encounter: Payer: Self-pay | Admitting: Internal Medicine

## 2018-04-18 DIAGNOSIS — E876 Hypokalemia: Secondary | ICD-10-CM

## 2018-04-18 NOTE — Telephone Encounter (Signed)
Please call pt.  She was recently evaluated in ER.  Confirm doing ok.  I reviewed her labs.  Her troponin (heart test) was ok.  White blood cell count, hgb, magnesium, kidney function and liver function - wnl.  Her potassium was slightly decreased.  Inform her and send info on foods with increased potassium.  Can recheck potassium in 1-2 weeks.  If any problems - needs to be seen.

## 2018-04-18 NOTE — Telephone Encounter (Signed)
Pt doing ok. Does not need to be seen. Pt aware of results.

## 2018-04-18 NOTE — Telephone Encounter (Signed)
Pt was driving and did not want to schedule at this time. She will call back

## 2018-05-02 ENCOUNTER — Ambulatory Visit (INDEPENDENT_AMBULATORY_CARE_PROVIDER_SITE_OTHER): Payer: Medicare Other | Admitting: Internal Medicine

## 2018-05-02 ENCOUNTER — Encounter: Payer: Self-pay | Admitting: Internal Medicine

## 2018-05-02 ENCOUNTER — Other Ambulatory Visit: Payer: Self-pay

## 2018-05-02 ENCOUNTER — Other Ambulatory Visit (INDEPENDENT_AMBULATORY_CARE_PROVIDER_SITE_OTHER): Payer: Medicare Other

## 2018-05-02 ENCOUNTER — Telehealth (INDEPENDENT_AMBULATORY_CARE_PROVIDER_SITE_OTHER): Payer: Medicare Other | Admitting: Internal Medicine

## 2018-05-02 ENCOUNTER — Emergency Department: Payer: Medicare Other

## 2018-05-02 ENCOUNTER — Emergency Department
Admission: EM | Admit: 2018-05-02 | Discharge: 2018-05-02 | Disposition: A | Discharge status: Home / Self Care | Payer: Medicare Other | Attending: Emergency Medicine | Admitting: Emergency Medicine

## 2018-05-02 DIAGNOSIS — E7800 Pure hypercholesterolemia, unspecified: Secondary | ICD-10-CM | POA: Diagnosis not present

## 2018-05-02 DIAGNOSIS — E049 Nontoxic goiter, unspecified: Secondary | ICD-10-CM

## 2018-05-02 DIAGNOSIS — R002 Palpitations: Secondary | ICD-10-CM | POA: Diagnosis not present

## 2018-05-02 DIAGNOSIS — R079 Chest pain, unspecified: Secondary | ICD-10-CM

## 2018-05-02 DIAGNOSIS — E876 Hypokalemia: Secondary | ICD-10-CM

## 2018-05-02 DIAGNOSIS — Z79899 Other long term (current) drug therapy: Secondary | ICD-10-CM | POA: Insufficient documentation

## 2018-05-02 DIAGNOSIS — I10 Essential (primary) hypertension: Secondary | ICD-10-CM | POA: Insufficient documentation

## 2018-05-02 DIAGNOSIS — Z7982 Long term (current) use of aspirin: Secondary | ICD-10-CM | POA: Insufficient documentation

## 2018-05-02 DIAGNOSIS — Z87891 Personal history of nicotine dependence: Secondary | ICD-10-CM | POA: Diagnosis not present

## 2018-05-02 LAB — CBC
HEMATOCRIT: 43.4 % (ref 36.0–46.0)
Hemoglobin: 15 g/dL (ref 12.0–15.0)
MCH: 31.3 pg (ref 26.0–34.0)
MCHC: 34.6 g/dL (ref 30.0–36.0)
MCV: 90.6 fL (ref 80.0–100.0)
PLATELETS: 364 10*3/uL (ref 150–400)
RBC: 4.79 MIL/uL (ref 3.87–5.11)
RDW: 12.6 % (ref 11.5–15.5)
WBC: 12.8 10*3/uL — ABNORMAL HIGH (ref 4.0–10.5)
nRBC: 0 % (ref 0.0–0.2)

## 2018-05-02 LAB — BASIC METABOLIC PANEL
Anion gap: 12 (ref 5–15)
BUN: 15 mg/dL (ref 8–23)
CALCIUM: 10 mg/dL (ref 8.9–10.3)
CO2: 27 mmol/L (ref 22–32)
CREATININE: 0.95 mg/dL (ref 0.44–1.00)
Chloride: 100 mmol/L (ref 98–111)
GFR calc non Af Amer: 60 mL/min — ABNORMAL LOW (ref >60)
GLUCOSE: 113 mg/dL — AB (ref 70–99)
Potassium: 3.3 mmol/L — ABNORMAL LOW (ref 3.5–5.1)
Sodium: 139 mmol/L (ref 135–145)

## 2018-05-02 LAB — POTASSIUM: Potassium: 4.4 mEq/L (ref 3.5–5.1)

## 2018-05-02 LAB — TROPONIN I: Troponin I: <0.03 ng/mL (ref <0.03)

## 2018-05-02 NOTE — Assessment & Plan Note (Signed)
Blood pressure on recheck better.  Hold on making changes until cardiac w/up complete. Follow.

## 2018-05-02 NOTE — ED Notes (Signed)
Patient transported to X-ray 

## 2018-05-02 NOTE — Assessment & Plan Note (Addendum)
Chest pain and increased heart rate as outlined.  Woke her from sleep.  Elevated blood pressure - better on recheck now.  EKG with SR - TWI III.  Discomfort currently has resolved.  No increased heart rate now.  Concern regarding pain/discomfort and intermittent episodes - possible ischemia, etc.  Feels washed out currently.  Needs further evaluation.  Discussed ER evaluation.  Pt in agreement.  ER notified.  Declined transport via ambulance.

## 2018-05-02 NOTE — ED Triage Notes (Signed)
Pt c/o waking from sleep this morning with her heart "pounding" denies SOB, nausea or any other sx. Skin is warm and dry.

## 2018-05-02 NOTE — Assessment & Plan Note (Signed)
Has been followed by ENT and Dr Solum.  Biopsy - benign colloid nodule.  Follow tsh.   

## 2018-05-02 NOTE — ED Provider Notes (Signed)
[facility] Emergency Department Provider Note   ____________________________________________    I have reviewed the triage vital signs and the nursing notes.   HISTORY  Chief Complaint Palpitations     HPI Cheyenne Baker is a 67 y.o. female who presents with complaints of palpitations.  Patient reports when she woke up this morning she felt that she had a racing heart.  Patient reports symptoms were ongoing for approximately 1 hour.  She denies any chest pain.  No shortness of breath.  No nausea or vomiting or diaphoresis.  No recent caffeine intake or new medications or drugs.  She reports this is happened once before approximately 2 half weeks ago, was seen in the emergency department since then.  Has not seen cardiology.  No history of arrhythmias.  No pleurisy calf pain or recent travel.   Past Medical History:  Diagnosis Date  . Allergy   . Hyperlipidemia   . Hypertension     Patient Active Problem List   Diagnosis Date Noted  . Chest pain 05/02/2018  . Stress 10/07/2016  . GERD (gastroesophageal reflux disease) 10/01/2014  . Skin lesion of breast 10/01/2014  . Health care maintenance 10/01/2014  . Environmental allergies 10/01/2014  . Essential hypertension, benign 12/21/2012  . Hypercholesterolemia 12/21/2012  . Syncope 12/21/2012  . Enlarged thyroid 12/21/2012    Past Surgical History:  Procedure Laterality Date  . ABDOMINAL HYSTERECTOMY  1980   ovaries not removed, secondary to bleeding  . CHOLECYSTECTOMY  1995  . COLONOSCOPY    . TONSILLECTOMY AND ADENOIDECTOMY  1975    Prior to Admission medications   Medication Sig Start Date End Date Taking? Authorizing Provider  amLODipine (NORVASC) 5 MG tablet TAKE ONE TABLET EVERY DAY 12/27/17   Einar Pheasant, MD  aspirin EC 81 MG tablet Take 81 mg by mouth daily.    [provider]  atenolol (TENORMIN) 50 MG tablet TAKE ONE TABLET EVERY DAY 03/27/18   Einar Pheasant, MD    losartan-hydrochlorothiazide [facility]) 100-12.5 MG tablet TAKE ONE TABLET EVERY DAY 03/27/18   Einar Pheasant, MD  pravastatin (PRAVACHOL) 20 MG tablet TAKE ONE TABLET BY MOUTH EVERY DAY 01/27/18   Einar Pheasant, MD     Allergies Patient has no known allergies.  Family History  Problem Relation Age of Onset  . Hypertension Mother   . Hyperlipidemia Mother   . Heart disease Father   . Hypertension Father   . Colon cancer Neg Hx   . Colon polyps Neg Hx   . Rectal cancer Neg Hx   . Esophageal cancer Neg Hx   . Stomach cancer Neg Hx   . Breast cancer Neg Hx     Social History Social History   Tobacco Use  . Smoking status: Former Smoker    Types: Cigarettes  . Smokeless tobacco: Never Used  Substance Use Topics  . Alcohol use: Yes    Alcohol/week: 0.0 standard drinks    Comment: occasionally  . Drug use: No    Review of Systems  Constitutional: No fever/chills Eyes: No visual changes.  ENT: No neck pain Cardiovascular: As above Respiratory: Denies shortness of breath. Gastrointestinal: No abdominal pain.  No nausea, no vomiting.   Genitourinary: Negative for dysuria. Musculoskeletal: Negative for back pain. Skin: Negative for rash. Neurological: Negative for headaches    ____________________________________________   PHYSICAL EXAM:  VITAL SIGNS: ED Triage Vitals  Enc Vitals Group     BP 05/02/18 1119 (!) 163/73  Pulse Rate 05/02/18 1119 (!) 57     Resp 05/02/18 1119 17     Temp 05/02/18 1119 97.9 F (36.6 C)     Temp Source 05/02/18 1119 Oral     SpO2 05/02/18 1119 100 %     Weight 05/02/18 1120 64.4 kg (142 lb)     Height 05/02/18 1120 1.651 m (5\' 5" )     Head Circumference --      Peak Flow --      Pain Score 05/02/18 1120 0     Pain Loc --      Pain Edu? --      Excl. in [place]? --     Constitutional: Alert and oriented. No acute distress. Pleasant and interactive  Nose: No congestion/rhinnorhea. Mouth/Throat: Mucous membranes are moist.  ?   Enlarged thyroid, patient states this is chronic  Cardiovascular: Normal rate, regular rhythm. Grossly normal heart sounds.  Good peripheral circulation. Respiratory: Normal respiratory effort.  No retractions. Lungs CTAB. Gastrointestinal: Soft and nontender. No distention.  No CVA tenderness. Genitourinary: deferred Musculoskeletal: No lower extremity tenderness nor edema.  Warm and well perfused Neurologic:  Normal speech and language. No gross focal neurologic deficits are appreciated.  Skin:  Skin is warm, dry and intact. No rash noted. Psychiatric: Mood and affect are normal. Speech and behavior are normal.  ____________________________________________   LABS (all labs ordered are listed, but only abnormal results are displayed)  Labs Reviewed  BASIC METABOLIC PANEL - Abnormal; Notable for the following components:      Result Value   Potassium 3.3 (*)    Glucose, Bld 113 (*)    GFR calc non Af Amer 60 (*)    All other components within normal limits  CBC - Abnormal; Notable for the following components:   WBC 12.8 (*)    All other components within normal limits  TROPONIN I   ____________________________________________  EKG  ED ECG REPORT I, Lavonia Drafts, the attending physician, personally viewed and interpreted this ECG.  Date: 05/02/2018  Rhythm: normal sinus rhythm QRS Axis: normal Intervals: normal ST/T Wave abnormalities: normal Narrative Interpretation: no evidence of acute ischemia  ____________________________________________  RADIOLOGY  Chest x-ray normal ____________________________________________   PROCEDURES  Procedure(s) performed: No  Procedures   Critical Care performed: No ____________________________________________   INITIAL IMPRESSION / ASSESSMENT AND PLAN / ED COURSE  Pertinent labs & imaging results that were available during my care of the patient were reviewed by me and considered in my medical decision making (see  chart for details).  Patient well-appearing in no acute distress.  Asymptomatic in the emergency department.  No further palpitations.  No abnormalities on monitor.  EKG is unremarkable, repeated.  Chest x-ray labs unremarkable.  Mildly elevated white blood cell count is nonspecific.  Patient likely needs long-term monitoring, will refer her to cardiology.  No indication for admission at this time.  Patient is content with this plan, she knows to return if any change in her symptoms    ____________________________________________   FINAL CLINICAL IMPRESSION(S) / ED DIAGNOSES  Final diagnoses:  Palpitations        Note:  This document was prepared using Dragon voice recognition software and may include unintentional dictation errors.    Lavonia Drafts, MD 05/02/18 [phone]

## 2018-05-02 NOTE — Progress Notes (Addendum)
Patient ID: Cheyenne Baker, female   DOB: 06-Jul-1950, 67 y.o.   MRN: [id number]   Subjective:    Patient ID: Cheyenne Baker, female    DOB: [birth year], 67 y.o.   MRN: [id number]  HPI  Patient here as a work in with concerns regarding chest discomfort and palpitations/increased heart racing.  Was seen in the ER 04/17/18 with elevated blood pressure and palpitations.  No tightness at that time.  Had labs.  Troponin negative.  Potassium slightly decreased.  Felt better.  Was discharged.  Doing ok until this am.  Woke her from sleep.  Felt a little different. Rolled over.  Noticed increased heart rate.  Felt flushed.  Chest discomfort.  Lying down aggravated.  Walked in to the office.  Reports symptoms as outlined.  While here has rested.  No increased heart rate or discomfort right now.  Feels better, but feels washed out.  EKG - TWI in III.  Discussed further w/up.     Past Medical History:  Diagnosis Date  . Allergy   . Hyperlipidemia   . Hypertension    Past Surgical History:  Procedure Laterality Date  . ABDOMINAL HYSTERECTOMY  1980   ovaries not removed, secondary to bleeding  . CHOLECYSTECTOMY  1995  . COLONOSCOPY    . TONSILLECTOMY AND ADENOIDECTOMY  1975   Family History  Problem Relation Age of Onset  . Hypertension Mother   . Hyperlipidemia Mother   . Heart disease Father   . Hypertension Father   . Colon cancer Neg Hx   . Colon polyps Neg Hx   . Rectal cancer Neg Hx   . Esophageal cancer Neg Hx   . Stomach cancer Neg Hx   . Breast cancer Neg Hx    Social History   Socioeconomic History  . Marital status: Widowed    Spouse name: Not on file  . Number of children: Not on file  . Years of education: Not on file  . Highest education level: Not on file  Occupational History  . Not on file  Social Needs  . Financial resource strain: Not on file  . Food insecurity:    Worry: Not on file    Inability: Not on file  . Transportation needs:    Medical: Not on file   Non-medical: Not on file  Tobacco Use  . Smoking status: Former Smoker    Types: Cigarettes  . Smokeless tobacco: Never Used  Substance and Sexual Activity  . Alcohol use: Yes    Alcohol/week: 0.0 standard drinks    Comment: occasionally  . Drug use: No  . Sexual activity: Yes  Lifestyle  . Physical activity:    Days per week: Not on file    Minutes per session: Not on file  . Stress: Not on file  Relationships  . Social connections:    Talks on phone: Not on file    Gets together: Not on file    Attends religious service: Not on file    Active member of club or organization: Not on file    Attends meetings of clubs or organizations: Not on file    Relationship status: Not on file  Other Topics Concern  . Not on file  Social History Narrative  . Not on file    Outpatient Encounter Medications as of 05/02/2018  Medication Sig  . amLODipine (NORVASC) 5 MG tablet TAKE ONE TABLET EVERY DAY  . aspirin EC 81 MG tablet Take 81 mg by  mouth daily.  Marland Kitchen atenolol (TENORMIN) 50 MG tablet TAKE ONE TABLET EVERY DAY  . losartan-hydrochlorothiazide (HYZAAR) 100-12.5 MG tablet TAKE ONE TABLET EVERY DAY  . pravastatin (PRAVACHOL) 20 MG tablet TAKE ONE TABLET BY MOUTH EVERY DAY   No facility-administered encounter medications on file as of 05/02/2018.     Review of Systems  Constitutional: Positive for fatigue. Negative for appetite change.  HENT: Negative for congestion and sinus pressure.   Respiratory: Negative for cough and shortness of breath.   Cardiovascular: Positive for chest pain. Negative for palpitations.       Describes the chest discomfort as outlined.    Gastrointestinal: Negative for abdominal pain, diarrhea, nausea and vomiting.  Genitourinary: Negative for difficulty urinating and dysuria.  Musculoskeletal: Negative for joint swelling and myalgias.  Skin: Negative for color change and rash.  Neurological: Negative for dizziness, light-headedness and headaches.    Psychiatric/Behavioral: Negative for agitation and dysphoric mood.       Objective:     Blood pressure rechecked by me:  142/78  Physical Exam  Constitutional: She appears well-developed and well-nourished. No distress.  HENT:  Nose: Nose normal.  Mouth/Throat: Oropharynx is clear and moist.  Neck: Neck supple. No thyromegaly present.  Cardiovascular: Normal rate and regular rhythm.  Pulmonary/Chest: Breath sounds normal. No respiratory distress. She has no wheezes.  Abdominal: Soft. Bowel sounds are normal. There is no tenderness.  Musculoskeletal: She exhibits no edema or tenderness.  Lymphadenopathy:    She has no cervical adenopathy.  Skin: No rash noted. No erythema.  Psychiatric: She has a normal mood and affect. Her behavior is normal.    BP (!) 142/78   Pulse 75   Temp 98.6 F (37 C) (Oral)   Resp 16   Wt 142 lb 8 oz (64.6 kg)   SpO2 96%   BMI 23.71 kg/m  Wt Readings from Last 3 Encounters:  05/02/18 142 lb 8 oz (64.6 kg)  04/17/18 140 lb (63.5 kg)  10/07/17 140 lb 9.6 oz (63.8 kg)     Lab Results  Component Value Date   WBC 8.4 04/17/2018   HGB 14.2 04/17/2018   HCT 41.3 04/17/2018   PLT 310 04/17/2018   GLUCOSE 143 (H) 04/17/2018   CHOL 184 10/07/2017   TRIG 89.0 10/07/2017   HDL 59.00 10/07/2017   LDLDIRECT 143.3 06/11/2013   LDLCALC 107 (H) 10/07/2017   ALT 18 04/17/2018   AST 23 04/17/2018   NA 140 04/17/2018   K 3.4 (L) 04/17/2018   CL 104 04/17/2018   CREATININE 0.93 04/17/2018   BUN 19 04/17/2018   CO2 31 04/17/2018   TSH 1.08 10/07/2017   HGBA1C 5.4 10/07/2017       Assessment & Plan:   Problem List Items Addressed This Visit    Chest pain    Chest pain and increased heart rate as outlined.  Woke her from sleep.  Elevated blood pressure - better on recheck now.  EKG with SR - TWI III.  Discomfort currently has resolved.  No increased heart rate now.  Concern regarding pain/discomfort and intermittent episodes - possible ischemia,  etc.  Feels washed out currently.  Needs further evaluation.  Discussed ER evaluation.  Pt in agreement.  ER notified.  Declined transport via ambulance.        Enlarged thyroid    Has been followed by ENT and Dr Gabriel Carina.  Biopsy - benign colloid nodule.  Follow tsh.        Essential  hypertension, benign    Blood pressure on recheck better.  Hold on making changes until cardiac w/up complete. Follow.        Hypercholesterolemia    On pravastatin.  Low cholesterol diet and exercise.  Follow lipid panel and liver function tests.           I spent 40 minutes with the patient and more than 50% of the time was spent in consultation regarding the above.  Time spent obtaining history and discussing current symptoms.  Times also spent discussing further evaluation and treatment and plans for referral to ER.    Einar Pheasant, MD

## 2018-05-02 NOTE — Assessment & Plan Note (Signed)
On pravastatin.  Low cholesterol diet and exercise.  Follow lipid panel and liver function tests.   

## 2018-05-02 NOTE — ED Notes (Signed)
First Nurse Note: Pt to ED c/o palpitations. Pt is in NAD at this time.

## 2018-05-09 NOTE — Telephone Encounter (Signed)
Scheduled

## 2018-05-20 DIAGNOSIS — Z23 Encounter for immunization: Secondary | ICD-10-CM | POA: Diagnosis not present

## 2018-06-09 ENCOUNTER — Encounter: Payer: Self-pay | Admitting: Family Medicine

## 2018-06-09 ENCOUNTER — Ambulatory Visit (INDEPENDENT_AMBULATORY_CARE_PROVIDER_SITE_OTHER): Payer: Medicare Other | Admitting: Family Medicine

## 2018-06-09 VITALS — BP 110/62 | HR 64 | Temp 98.3°F | Ht 65.0 in | Wt 134.4 lb

## 2018-06-09 DIAGNOSIS — R0981 Nasal congestion: Secondary | ICD-10-CM | POA: Diagnosis not present

## 2018-06-09 DIAGNOSIS — R05 Cough: Secondary | ICD-10-CM | POA: Diagnosis not present

## 2018-06-09 DIAGNOSIS — R070 Pain in throat: Secondary | ICD-10-CM

## 2018-06-09 DIAGNOSIS — R059 Cough, unspecified: Secondary | ICD-10-CM

## 2018-06-09 DIAGNOSIS — J00 Acute nasopharyngitis [common cold]: Secondary | ICD-10-CM | POA: Diagnosis not present

## 2018-06-09 LAB — POCT RAPID STREP A (OFFICE): RAPID STREP A SCREEN: NEGATIVE

## 2018-06-09 MED ORDER — FLUTICASONE PROPIONATE 50 MCG/ACT NA SUSP: 2.0000 | Freq: Every day | NASAL | 1 refills | Status: DC

## 2018-06-09 MED ORDER — BENZONATATE 100 MG PO CAPS: 100.0000 mg | ORAL_CAPSULE | Freq: Three times a day (TID) | ORAL | 0 refills | Status: DC | PRN

## 2018-06-09 NOTE — Progress Notes (Signed)
   Subjective:    Patient ID: Cheyenne Baker, female    DOB: December 29, 1950, 67 y.o.   MRN: [id number]  HPI  Presents to clinic c/o cough and sore throat for 3 days. She has taken some tylenol as home without much symptom relief.  Patient denies any phlegm production with cough.  States she does have some clear nasal drainage.  Denies any fever or chills.  Does report feeling tired overall.  Patient Active Problem List   Diagnosis Date Noted  . Chest pain 05/02/2018  . Stress 10/07/2016  . GERD (gastroesophageal reflux disease) 10/01/2014  . Skin lesion of breast 10/01/2014  . Health care maintenance 10/01/2014  . Environmental allergies 10/01/2014  . Essential hypertension, benign 12/21/2012  . Hypercholesterolemia 12/21/2012  . Syncope 12/21/2012  . Enlarged thyroid 12/21/2012   Social History   Tobacco Use  . Smoking status: Former Smoker    Types: Cigarettes  . Smokeless tobacco: Never Used  Substance Use Topics  . Alcohol use: Yes    Alcohol/week: 0.0 standard drinks    Comment: occasionally   Review of Systems   Constitutional: +tired. Negative for chills, and fever.  HENT: +sore throat, runny nose, nasal congestion.    Eyes: Negative.   Respiratory: +cough. Negative for shortness of breath and wheezing.   Cardiovascular: Negative for chest pain, palpitations and leg swelling.  Gastrointestinal: Negative for abdominal pain, diarrhea, nausea and vomiting.  Genitourinary: Negative for dysuria, frequency and urgency.  Musculoskeletal: Negative for arthralgias and myalgias.  Skin: Negative for color change, pallor and rash.  Neurological: Negative for syncope, light-headedness and headaches.  Psychiatric/Behavioral: The patient is not nervous/anxious.       Objective:   Physical Exam  Constitutional: She appears well-developed and well-nourished. No distress.  HENT:  Nose/throat: Positive clear nasal discharge, positive postnasal drip.  No swelling or exudate in back  of throat. Head: Normocephalic and atraumatic.  Eyes: Pupils are equal, round, and reactive to light. EOM are normal. No scleral icterus.  Neck: Normal range of motion. Neck supple. No tracheal deviation present.  Cardiovascular: Normal rate, regular rhythm and normal heart sounds.  Pulmonary/Chest: Effort normal and breath sounds normal. No respiratory distress. She has no wheezes. She has no rales.   Neurological: She is alert and oriented to person, place, and time. Gait normal  Skin: Skin is warm and dry. No pallor.  Psychiatric: She has a normal mood and affect. Her behavior is normal. Thought content normal.   Nursing note and vitals reviewed.    Vitals:   06/09/18 1108  BP: 110/62  Pulse: 64  Temp: 98.3 F (36.8 C)  SpO2: 98%   Assessment & Plan:   Common cold/nasal congestion/cough/pain in throat-rapid strep testing is negative in clinic.  Suspect symptoms are all related to a common cold virus.  Patient advised to try a over-the-counter antihistamine like Claritin Allegra or Zyrtec to help dry up congestion.  Also she will use Flonase nasal spray, and take Tessalon Perles as needed to help calm cough.  Advised to rest, increase fluids and do good handwashing.  Viral illnesses often take 7 to 10 days to fully run course, patient advised she should be feeling better by the end of this week.  Keep regularly scheduled follow-up with PCP as planned.  Return to clinic sooner if any issues arise.

## 2018-06-09 NOTE — Patient Instructions (Signed)
Use Claritin, allegra or zyrtec (available OTC) to help dry up congestion  Rest, increase fluids, do good handwashing

## 2018-06-12 LAB — CULTURE, UPPER RESPIRATORY
MICRO NUMBER: 91502163
SPECIMEN QUALITY:: ADEQUATE

## 2018-06-26 ENCOUNTER — Other Ambulatory Visit: Payer: Self-pay | Admitting: Internal Medicine

## 2018-07-06 NOTE — Progress Notes (Signed)
Cardiology Office Note  Date:  07/07/2018   ID:  Irwin Brakeman, DOB [birth year], MRN [id number]  PCP:  Einar Pheasant, MD   Chief Complaint  Patient presents with  . other    Follow up from [facility] ER; palpitations. Meds reviewed by the pt. verbally. Pt. c/o some palpitations at times.     HPI:  Cheyenne Baker is a 68 y.o. female  Smoker, quit many years ago Paroxysmal tachycardia HTN Who presents by referral from Einar Pheasant for consultation of her tachycardia  She was in the emergency room April 17, 2018 for tachycardia " That was the worst" Seem to come on acutely, lasted for several hours Resolved when EMTs arrived Taken to the emergency room, low potassium  Second episode 2 weeks later In the ER 05/02/2018 complaints of palpitations, acute tachycardia  Patient reports when she woke up this morning she felt that she had a racing heart.   ongoing for approximately 1 hour.   She denies any chest pain.   No shortness of breath.  Again with low potassium  Otherwise reports that she is active with no complaints No regular exercise program Recent stressors, lost her husband 1 year ago coming up on the anniversary Finding it difficult Occasionally with palpitations when she is trying to go to sleep at nighttime often relieved by changing position  EKG personally reviewed by myself on todays visit Shows normal sinus rhythm rate 73 bpm no significant ST or T wave changes    PMH:   has a past medical history of Allergy, Hyperlipidemia, and Hypertension.  PSH:    Past Surgical History:  Procedure Laterality Date  . ABDOMINAL HYSTERECTOMY  1980   ovaries not removed, secondary to bleeding  . CHOLECYSTECTOMY  1995  . COLONOSCOPY    . TONSILLECTOMY AND ADENOIDECTOMY  1975    Current Outpatient Medications  Medication Sig Dispense Refill  . amLODipine (NORVASC) 5 MG tablet TAKE ONE TABLET BY MOUTH EVERY DAY 90 tablet 1  . aspirin EC 81 MG tablet Take 81 mg by mouth  daily.    Marland Kitchen atenolol (TENORMIN) 50 MG tablet TAKE ONE TABLET EVERY DAY 90 tablet 1  . benzonatate (TESSALON) 100 MG capsule Take 1 capsule (100 mg total) by mouth 3 (three) times daily as needed for cough. 30 capsule 0  . fluticasone (FLONASE) 50 MCG/ACT nasal spray Place 2 sprays into both nostrils daily. 16 g 1  . losartan-hydrochlorothiazide (HYZAAR) 100-12.5 MG tablet TAKE ONE TABLET EVERY DAY 90 tablet 1  . pravastatin (PRAVACHOL) 20 MG tablet TAKE ONE TABLET BY MOUTH EVERY DAY 90 tablet 0  . potassium chloride (K-DUR) 10 MEQ tablet Take 1 tablet (10 mEq total) by mouth daily. 90 tablet 3   No current facility-administered medications for this visit.      Allergies:   Patient has no known allergies.   Social History:  The patient  reports that she has quit smoking. Her smoking use included cigarettes. She has a 10.00 pack-year smoking history. She has never used smokeless tobacco. She reports current alcohol use. She reports that she does not use drugs.   Family History:   family history includes Heart disease in her father; Hyperlipidemia in her mother; Hypertension in her father and mother.    Review of Systems: Review of Systems  Constitutional: Negative.   Respiratory: Negative.   Cardiovascular: Positive for palpitations.       Tachycardia episodes  Gastrointestinal: Negative.   Musculoskeletal: Negative.  Neurological: Negative.   Psychiatric/Behavioral: Negative.   All other systems reviewed and are negative.   PHYSICAL EXAM: VS:  BP (!) 150/72 (BP Location: Left Arm, Patient Position: Sitting, Cuff Size: Normal)   Pulse 73   Ht 5\' 5"  (1.651 m)   Wt 137 lb (62.1 kg)   BMI 22.80 kg/m  , BMI Body mass index is 22.8 kg/m. GEN: Well nourished, well developed, in no acute distress  HEENT: normal  Neck: no JVD, carotid bruits, or masses Cardiac: RRR; no murmurs, rubs, or gallops,no edema  Respiratory:  clear to auscultation bilaterally, normal work of breathing GI:  soft, nontender, nondistended, + BS MS: no deformity or atrophy  Skin: warm and dry, no rash Neuro:  Strength and sensation are intact Psych: euthymic mood, full affect   Recent Labs: 10/07/2017: TSH 1.08 04/17/2018: ALT 18; Magnesium 2.4 05/02/2018: BUN 15; Creatinine, Ser 0.95; Hemoglobin 15.0; Platelets 364; Potassium 3.3; Sodium 139    Lipid Panel Lab Results  Component Value Date   CHOL 184 10/07/2017   HDL 59.00 10/07/2017   LDLCALC 107 (H) 10/07/2017   TRIG 89.0 10/07/2017      Wt Readings from Last 3 Encounters:  07/07/18 137 lb (62.1 kg)  06/09/18 134 lb 6.4 oz (61 kg)  05/02/18 142 lb (64.4 kg)       ASSESSMENT AND PLAN:  Paroxysmal tachycardia ([place]) - Plan: EKG 12-Lead 2 episodes of tachycardia in October and November Possibly atrial tachycardia though unable to exclude SVT Recommended if she has additional episodes would take extra atenolol No episodes in the past 2 months We did discuss a long term monitor, but again if no episodes in 2 months may not pick up anything over 2 weeks She will call us for recurrent symptoms, monitor could be placed at her convenience Essentially normal EKG, will hold off on echocardiogram as she feels well  Essential hypertension Elevated blood pressure on today's visit Recommend she monitor blood pressure at home and call our office with more numbers If blood pressure continues to run high could increase amlodipine up to 10 mg daily or atenolol up to 50 twice daily  Hypercholesterolemia On pravastatin, stable numbers Unclear if she needs aspirin We did discuss CT coronary calcium scoring for risk stratification Information provided.  She will call us if she would like this ordered  Adjustment disorder, unspecified type Recent loss of her husband, still adjusting Coming up on 1 year anniversary  Disposition:   F/U as needed   Total encounter time more than 45 minutes  Greater than 50% was spent in counseling and  coordination of care with the patient    Orders Placed This Encounter  Procedures  . EKG 12-Lead     Signed, Esmond Plants, M.D., Ph.D. 07/07/2018  [place], [place]

## 2018-07-07 ENCOUNTER — Ambulatory Visit (INDEPENDENT_AMBULATORY_CARE_PROVIDER_SITE_OTHER): Payer: Medicare Other | Admitting: Cardiovascular Disease

## 2018-07-07 ENCOUNTER — Ambulatory Visit: Payer: Medicare Other

## 2018-07-07 ENCOUNTER — Encounter: Payer: Self-pay | Admitting: Cardiovascular Disease

## 2018-07-07 ENCOUNTER — Encounter

## 2018-07-07 VITALS — BP 150/72 | HR 73 | Ht 65.0 in | Wt 137.0 lb

## 2018-07-07 DIAGNOSIS — I10 Essential (primary) hypertension: Secondary | ICD-10-CM

## 2018-07-07 DIAGNOSIS — E7800 Pure hypercholesterolemia, unspecified: Secondary | ICD-10-CM | POA: Diagnosis not present

## 2018-07-07 DIAGNOSIS — F4320 Adjustment disorder, unspecified: Secondary | ICD-10-CM | POA: Diagnosis not present

## 2018-07-07 DIAGNOSIS — I479 Paroxysmal tachycardia, unspecified: Secondary | ICD-10-CM

## 2018-07-07 MED ORDER — POTASSIUM CHLORIDE ER 10 MEQ PO TBCR: 10.0000 meq | EXTENDED_RELEASE_TABLET | Freq: Every day | ORAL | 3 refills | Status: DC

## 2018-07-07 NOTE — Patient Instructions (Addendum)
Medication Instructions:   Please start potassium one a day  If you need a refill on your cardiac medications before your next appointment, please call your pharmacy.    Lab work: No new labs needed   If you have labs (blood work) drawn today and your tests are completely normal, you will receive your results only by: Marland Kitchen MyChart Message (if you have MyChart) OR . A paper copy in the mail If you have any lab test that is abnormal or we need to change your treatment, we will call you to review the results.   Testing/Procedures:  Think about CT coronary calcium score [place], $150  Follow-Up: At [facility], you and your health needs are our priority.  As part of our continuing mission to provide you with exceptional heart care, we have created designated Provider Care Teams.  These Care Teams include your primary Cardiologist (physician) and Advanced Practice Providers (APPs -  Physician Assistants and Nurse Practitioners) who all work together to provide you with the care you need, when you need it.  . You will need a follow up appointment as needed  . Providers on your designated Care Team:   . Murray Hodgkins, NP . Christell Faith, PA-C . Marrianne Mood, PA-C  Any Other Special Instructions Will Be Listed Below (If Applicable).  For educational health videos Log in to : [email or web address] Or : [email or web address], password : [email or web address]

## 2018-07-08 ENCOUNTER — Ambulatory Visit: Payer: Medicare Other

## 2018-07-09 ENCOUNTER — Ambulatory Visit (INDEPENDENT_AMBULATORY_CARE_PROVIDER_SITE_OTHER): Payer: Medicare Other

## 2018-07-09 VITALS — BP 142/70 | HR 67 | Temp 98.2°F | Resp 15 | Ht 64.0 in | Wt 136.1 lb

## 2018-07-09 DIAGNOSIS — Z0000 Encounter for general adult medical examination without abnormal findings: Secondary | ICD-10-CM | POA: Diagnosis not present

## 2018-07-09 NOTE — Progress Notes (Signed)
Subjective:   Cheyenne Baker is a 68 y.o. female who presents for Medicare Annual (Subsequent) preventive examination.  Review of Systems:  No ROS.  Medicare Wellness Visit. Additional risk factors are reflected in the social history. Cardiac Risk Factors include: advanced age (>66men, >19 women);hypertension     Objective:     Vitals: BP (!) 142/70 (BP Location: Left Arm, Patient Position: Sitting, Cuff Size: Normal)   Pulse 67   Temp 98.2 F (36.8 C) (Oral)   Resp 15   Ht 5\' 4"  (1.626 m)   Wt 136 lb 1.9 oz (61.7 kg)   SpO2 97%   BMI 23.36 kg/m   Body mass index is 23.36 kg/m.  Advanced Directives 07/09/2018 05/02/2018 04/17/2018 07/04/2017 06/22/2016 12/08/2014 11/29/2014  Does Patient Have a Medical Advance Directive? Yes Yes No;Yes Yes No No No  Type of Paramedic of [place];Living will [place];Living will [place];Living will [place];Living will - - -  Does patient want to make changes to medical advance directive? No - Patient declined No - Patient declined No - Patient declined No - Patient declined Yes (MAU/Ambulatory/Procedural Areas - Information given) - -  Copy of [place] in Chart? No - copy requested No - copy requested No - copy requested No - copy requested No - copy requested - -  Would patient like information on creating a medical advance directive? - No - Patient declined No - Patient declined - - - -    Tobacco Social History   Tobacco Use  Smoking Status Former Smoker  . Packs/day: 0.50  . Years: 20.00  . Pack years: 10.00  . Types: Cigarettes  Smokeless Tobacco Never Used     Counseling given: Not Answered   Clinical Intake:  Pre-visit preparation completed: Yes        Diabetes: No  How often do you need to have someone help you when you read instructions, pamphlets, or other written materials from your doctor or pharmacy?: 1 -  Never  Interpreter Needed?: No     Past Medical History:  Diagnosis Date  . Allergy   . Hyperlipidemia   . Hypertension    Past Surgical History:  Procedure Laterality Date  . ABDOMINAL HYSTERECTOMY  1980   ovaries not removed, secondary to bleeding  . CHOLECYSTECTOMY  1995  . COLONOSCOPY    . TONSILLECTOMY AND ADENOIDECTOMY  1975   Family History  Problem Relation Age of Onset  . Hypertension Mother   . Hyperlipidemia Mother   . Heart disease Father   . Hypertension Father   . Colon cancer Neg Hx   . Colon polyps Neg Hx   . Rectal cancer Neg Hx   . Esophageal cancer Neg Hx   . Stomach cancer Neg Hx   . Breast cancer Neg Hx    Social History   Socioeconomic History  . Marital status: Widowed    Spouse name: Not on file  . Number of children: Not on file  . Years of education: Not on file  . Highest education level: Not on file  Occupational History  . Not on file  Social Needs  . Financial resource strain: Not hard at all  . Food insecurity:    Worry: Never true    Inability: Never true  . Transportation needs:    Medical: No    Non-medical: No  Tobacco Use  . Smoking status: Former Smoker  Packs/day: 0.50    Years: 20.00    Pack years: 10.00    Types: Cigarettes  . Smokeless tobacco: Never Used  Substance and Sexual Activity  . Alcohol use: Yes    Alcohol/week: 0.0 standard drinks    Comment: occasionally  . Drug use: No  . Sexual activity: Yes  Lifestyle  . Physical activity:    Days per week: 0 days    Minutes per session: Not on file  . Stress: Not at all  Relationships  . Social connections:    Talks on phone: Not on file    Gets together: Not on file    Attends religious service: Not on file    Active member of club or organization: Not on file    Attends meetings of clubs or organizations: Not on file    Relationship status: Not on file  Other Topics Concern  . Not on file  Social History Narrative  . Not on file     Outpatient Encounter Medications as of 07/09/2018  Medication Sig  . amLODipine (NORVASC) 5 MG tablet TAKE ONE TABLET BY MOUTH EVERY DAY  . aspirin EC 81 MG tablet Take 81 mg by mouth daily.  Marland Kitchen atenolol (TENORMIN) 50 MG tablet TAKE ONE TABLET EVERY DAY  . fluticasone (FLONASE) 50 MCG/ACT nasal spray Place 2 sprays into both nostrils daily.  Marland Kitchen losartan-hydrochlorothiazide (HYZAAR) 100-12.5 MG tablet TAKE ONE TABLET EVERY DAY  . potassium chloride (K-DUR) 10 MEQ tablet Take 1 tablet (10 mEq total) by mouth daily.  . pravastatin (PRAVACHOL) 20 MG tablet TAKE ONE TABLET BY MOUTH EVERY DAY  . [DISCONTINUED] benzonatate (TESSALON) 100 MG capsule Take 1 capsule (100 mg total) by mouth 3 (three) times daily as needed for cough. (Patient not taking: Reported on 07/09/2018)   No facility-administered encounter medications on file as of 07/09/2018.     Activities of Daily Living In your present state of health, do you have any difficulty performing the following activities: 07/09/2018  Hearing? N  Vision? N  Difficulty concentrating or making decisions? N  Walking or climbing stairs? N  Dressing or bathing? N  Doing errands, shopping? N  Preparing Food and eating ? N  Using the Toilet? N  In the past six months, have you accidently leaked urine? N  Do you have problems with loss of bowel control? N  Managing your Medications? N  Managing your Finances? N  Housekeeping or managing your Housekeeping? N  Some recent data might be hidden    Patient Care Team: Einar Pheasant, MD as PCP - General (Internal Medicine)    Assessment:   This is a routine wellness examination for Cheyenne Baker.  Reports office visit with Cardiologist this passed Monday. States she was  started her on potassium 10mg  daily. She will begin first dose today.  No episodes of palpitations since November. See chart review. Directed to take 1 tablet Atenolol if palpitations return. She does not wish to schedule with pcp at this  time for any issue listed above.  Plans to keep scheduled routine appointments.  No follow up on file.    Health Screenings  Mammogram 11/07/17 Colonoscopy 12/08/14 Bone Density 10/02/16 Glaucoma -none reported Hearing -passes the whisper test Hemoglobin A1C -10/07/17 (5.4) Cholesterol 10/07/17 (184)  Social  Alcohol intake -no Smoking history- no Smokers in home? none Illicit drug use? none Exercise none. She plans to start walking  Diet -regular Sexually Active -no  Safety  Patient feels safe at home Patient  does have smoke detectors at home  Patient does wear sunscreen or protective clothing when in direct sunlight  Patient does wear seat belt when driving or riding with others.   Activities of Daily Living Patient can do their own household chores. Denies needing assistance with: driving, feeding themselves, getting from bed to chair, getting to the toilet, bathing/showering, dressing, managing money, climbing flight of stairs, or preparing meals.   Depression Screen Patient denies losing interest in daily life, feeling hopeless, or crying easily over simple problems.   Fall Screen Patient denies being afraid of falling or falling in the last year.   Memory Screen Patient denies problems with memory, misplacing items, and is able to balance checkbook/bank accounts.  Patient is alert, normal appearance, oriented to person/place/and time. Correctly identified the president of the [place], recall of 3/3 objects, and performing simple calculations.  Patient displays appropriate judgement and can read correct time from watch face.   Immunizations The following Immunizations are up to date: Influenza, shingles, pneumonia, and tetanus.   Other Providers Patient Care Team: Einar Pheasant, MD as PCP - General (Internal Medicine)  Exercise Activities and Dietary recommendations Current Exercise Habits: The patient does not participate in regular exercise at present  Goals       Patient Stated   . Increase physical activity (pt-stated)     Walk for exercise       Fall Risk Fall Risk  07/09/2018 07/04/2017 06/22/2016 10/04/2015 09/28/2014  Falls in the past year? 0 No No No No   Depression Screen PHQ 2/9 Scores 07/09/2018 07/04/2017 06/22/2016 10/04/2015  PHQ - 2 Score 0 0 0 0  PHQ- 9 Score - 0 - -     Cognitive Function MMSE - Mini Mental State Exam 07/09/2018 07/04/2017 06/22/2016  Orientation to time 5 5 5   Orientation to Place 5 5 5   Registration 3 3 3   Attention/ Calculation 5 5 5   Recall 3 3 3   Language- name 2 objects 2 2 2   Language- repeat 1 1 1   Language- follow 3 step command 3 3 3   Language- read & follow direction 1 1 1   Write a sentence 1 1 1   Copy design 1 1 1   Total score 30 30 30         Immunization History  Administered Date(s) Administered  . Influenza, High Dose Seasonal PF 06/22/2016, 07/04/2017, 05/20/2018  . Pneumococcal Conjugate-13 09/28/2014  . Pneumococcal Polysaccharide-23 10/04/2016  . Zoster 10/26/2013  . Zoster Recombinat (Shingrix) 03/11/2018   Screening Tests Health Maintenance  Topic Date Due  . MAMMOGRAM  11/08/2018  . TETANUS/TDAP  01/23/2021  . COLONOSCOPY  12/07/2024  . INFLUENZA VACCINE  Completed  . DEXA SCAN  Completed  . Hepatitis C Screening  Completed  . PNA vac Low Risk Adult  Completed      Plan:    End of life planning; Advance aging; Advanced directives discussed. Copy of current HCPOA/Living Will requested.    I have personally reviewed and noted the following in the patient's chart:   . Medical and social history . Use of alcohol, tobacco or illicit drugs  . Current medications and supplements . Functional ability and status . Nutritional status . Physical activity . Advanced directives . List of other physicians . Hospitalizations, surgeries, and ER visits in previous 12 months . Vitals . Screenings to include cognitive, depression, and falls . Referrals and appointments  In  addition, I have reviewed and discussed with patient certain preventive protocols, quality  metrics, and [place] practice recommendations. A written personalized care plan for preventive services as well as general preventive health recommendations were provided to patient.     Varney Biles, LPN  07/25/4386   Reviewed above information.  Agree with assessment and plan.  Just evlauated by cardiology for f/u of her palpitations.    Dr Nicki Reaper

## 2018-07-09 NOTE — Patient Instructions (Addendum)
  Cheyenne Baker , Thank you for taking time to come for your Medicare Wellness Visit. I appreciate your ongoing commitment to your health goals. Please review the following plan we discussed and let me know if I can assist you in the future.   Follow up as needed.    Bring a copy of your [place] and/or Living Will to be scanned into chart.  Have a great day!  These are the goals we discussed: Goals      Patient Stated   . Increase physical activity (pt-stated)     Walk for exercise       This is a list of the screening recommended for you and due dates:  Health Maintenance  Topic Date Due  . Mammogram  11/08/2018  . Tetanus Vaccine  01/23/2021  . Colon Cancer Screening  12/07/2024  . Flu Shot  Completed  . DEXA scan (bone density measurement)  Completed  .  Hepatitis C: One time screening is recommended by Center for Disease Control  (CDC) for  adults born from [birth year] through 1967.   Completed  . Pneumonia vaccines  Completed

## 2018-09-19 ENCOUNTER — Other Ambulatory Visit: Payer: Self-pay | Admitting: Internal Medicine

## 2018-09-26 ENCOUNTER — Other Ambulatory Visit: Payer: Self-pay | Admitting: Internal Medicine

## 2018-12-25 ENCOUNTER — Other Ambulatory Visit: Payer: Self-pay | Admitting: Internal Medicine

## 2019-03-05 ENCOUNTER — Other Ambulatory Visit: Payer: Self-pay

## 2019-03-05 ENCOUNTER — Encounter: Payer: Self-pay | Admitting: Internal Medicine

## 2019-03-05 DIAGNOSIS — Z1231 Encounter for screening mammogram for malignant neoplasm of breast: Secondary | ICD-10-CM

## 2019-03-05 DIAGNOSIS — Z23 Encounter for immunization: Secondary | ICD-10-CM | POA: Diagnosis not present

## 2019-03-26 ENCOUNTER — Encounter: Payer: Self-pay | Admitting: Internal Medicine

## 2019-03-27 ENCOUNTER — Other Ambulatory Visit: Payer: Self-pay | Admitting: Cardiovascular Disease

## 2019-03-27 ENCOUNTER — Other Ambulatory Visit: Payer: Self-pay | Admitting: Internal Medicine

## 2019-05-05 ENCOUNTER — Ambulatory Visit
Admission: RE | Admit: 2019-05-05 | Discharge: 2019-05-05 | Disposition: A | Discharge status: Home / Self Care | Payer: Medicare Other | Source: Ambulatory Visit | Attending: Internal Medicine | Admitting: Internal Medicine

## 2019-05-05 ENCOUNTER — Other Ambulatory Visit: Payer: Self-pay

## 2019-05-05 DIAGNOSIS — Z1231 Encounter for screening mammogram for malignant neoplasm of breast: Secondary | ICD-10-CM | POA: Diagnosis not present

## 2019-06-29 ENCOUNTER — Other Ambulatory Visit: Payer: Self-pay | Admitting: Cardiovascular Disease

## 2019-06-29 ENCOUNTER — Other Ambulatory Visit: Payer: Self-pay | Admitting: Internal Medicine

## 2019-07-13 ENCOUNTER — Telehealth: Payer: Self-pay | Admitting: Internal Medicine

## 2019-07-13 ENCOUNTER — Ambulatory Visit (INDEPENDENT_AMBULATORY_CARE_PROVIDER_SITE_OTHER): Payer: Medicare Other

## 2019-07-13 ENCOUNTER — Ambulatory Visit (INDEPENDENT_AMBULATORY_CARE_PROVIDER_SITE_OTHER): Payer: Medicare Other | Admitting: Internal Medicine

## 2019-07-13 ENCOUNTER — Encounter: Payer: Self-pay | Admitting: Internal Medicine

## 2019-07-13 ENCOUNTER — Other Ambulatory Visit: Payer: Self-pay

## 2019-07-13 VITALS — BP 138/76 | Ht 65.0 in | Wt 129.0 lb

## 2019-07-13 VITALS — Ht 64.0 in | Wt 136.0 lb

## 2019-07-13 DIAGNOSIS — F439 Reaction to severe stress, unspecified: Secondary | ICD-10-CM

## 2019-07-13 DIAGNOSIS — R739 Hyperglycemia, unspecified: Secondary | ICD-10-CM

## 2019-07-13 DIAGNOSIS — E049 Nontoxic goiter, unspecified: Secondary | ICD-10-CM | POA: Diagnosis not present

## 2019-07-13 DIAGNOSIS — E7800 Pure hypercholesterolemia, unspecified: Secondary | ICD-10-CM

## 2019-07-13 DIAGNOSIS — I10 Essential (primary) hypertension: Secondary | ICD-10-CM | POA: Diagnosis not present

## 2019-07-13 DIAGNOSIS — Z0000 Encounter for general adult medical examination without abnormal findings: Secondary | ICD-10-CM | POA: Diagnosis not present

## 2019-07-13 DIAGNOSIS — I479 Paroxysmal tachycardia, unspecified: Secondary | ICD-10-CM | POA: Diagnosis not present

## 2019-07-13 NOTE — Telephone Encounter (Signed)
Lm for pt to call back and schedule fasting labs in 3-4 weeks and yearly follow up in 32m

## 2019-07-13 NOTE — Progress Notes (Signed)
Patient ID: Cheyenne Baker, female   DOB: 04/21/1951, 69 y.o.   MRN: [id number]   Virtual Visit via video Note  This visit type was conducted due to national recommendations for restrictions regarding the COVID-19 pandemic (e.g. social distancing).  This format is felt to be most appropriate for this patient at this time.  All issues noted in this document were discussed and addressed.  No physical exam was performed (except for noted visual exam findings with Video Visits).   I connected with Cheyenne Baker by a video enabled telemedicine application and verified that I am speaking with the correct person using two identifiers. Location patient: home Location provider: work  Persons participating in the virtual visit: patient, provider  The limitations, risks, security and privacy concerns of performing an evaluation and management service by video and the availability of in person appointments have been discussed.   The patient expressed understanding and agreed to proceed.   Reason for visit: scheduled follow up.   HPI: She reports she is doing well.  Feels good.  Staying active.  No chest pain with increased activity or exertion.  No increased heart rate or palpitations.  Taking potassium and atenolol.  Has had no further episodes since being on this regimen.   No sob.  Her mother had a stroke in 08/2018.  Lost 1/2 vision in both eyes.  She has been staying with her since 08/2018.  Mother is doing well.  [name] is in a new relationship.  Happy.  Things are going well.  Handling stress well.  No acid reflux reported.  No abdominal pain.  Bowels moving.  She is walking.     ROS: See pertinent positives and negatives per HPI.  Past Medical History:  Diagnosis Date  . Allergy   . Hyperlipidemia   . Hypertension     Past Surgical History:  Procedure Laterality Date  . ABDOMINAL HYSTERECTOMY  1980   ovaries not removed, secondary to bleeding  . CHOLECYSTECTOMY  1995  . COLONOSCOPY    .  TONSILLECTOMY AND ADENOIDECTOMY  1975    Family History  Problem Relation Age of Onset  . Hypertension Mother   . Hyperlipidemia Mother   . Heart disease Father   . Hypertension Father   . Colon cancer Neg Hx   . Colon polyps Neg Hx   . Rectal cancer Neg Hx   . Esophageal cancer Neg Hx   . Stomach cancer Neg Hx   . Breast cancer Neg Hx     SOCIAL HX: reviewed.    Current Outpatient Medications:  .  amLODipine (NORVASC) 5 MG tablet, TAKE ONE TABLET EVERY DAY, Disp: 90 tablet, Rfl: 1 .  aspirin EC 81 MG tablet, Take 81 mg by mouth daily., Disp: , Rfl:  .  atenolol (TENORMIN) 50 MG tablet, TAKE ONE TABLET EVERY DAY, Disp: 90 tablet, Rfl: 1 .  fluticasone (FLONASE) 50 MCG/ACT nasal spray, Place 2 sprays into both nostrils daily., Disp: 16 g, Rfl: 1 .  losartan-hydrochlorothiazide (HYZAAR) 100-12.5 MG tablet, TAKE ONE TABLET BY MOUTH EVERY DAY, Disp: 90 tablet, Rfl: 1 .  potassium chloride (KLOR-CON) 10 MEQ tablet, TAKE ONE TABLET EVERY DAY, Disp: 90 tablet, Rfl: 3 .  pravastatin (PRAVACHOL) 20 MG tablet, TAKE ONE TABLET EVERY DAY, Disp: 90 tablet, Rfl: 0  EXAM:  VITALS per patient if applicable: 017/51  GENERAL: alert, oriented, appears well and in no acute distress  HEENT: atraumatic, conjunttiva clear, no obvious abnormalities on inspection of  external nose and ears  NECK: normal movements of the head and neck  LUNGS: on inspection no signs of respiratory distress, breathing rate appears normal, no obvious gross SOB, gasping or wheezing  CV: no obvious cyanosis  PSYCH/NEURO: pleasant and cooperative, no obvious depression or anxiety, speech and thought processing grossly intact  ASSESSMENT AND PLAN:  Discussed the following assessment and plan:  Enlarged thyroid Has been followed by ENT and Dr Gabriel Carina.  Biopsy - benign colloid nodule.  Follow tsh.   Essential hypertension Blood pressure under good control.  Continue same medication regimen.  Follow pressures.  Follow  metabolic panel.    Hypercholesterolemia On pravastatin.  Low cholesterol diet and exercise.  Follow lipid panel and liver function tests.    Hyperglycemia Follow met b and a1c.   Paroxysmal tachycardia (HCC) On potassium and atenolol.  No further episodes of increased heart rate or palpitations.  Follow.    Stress Doing well.  In a new relationship.  Follow.     Orders Placed This Encounter  Procedures  . CBC with Differential/Platelet    Standing Status:   Future    Standing Expiration Date:   07/12/2020  . Hepatic function panel    Standing Status:   Future    Standing Expiration Date:   07/12/2020  . Lipid panel    Standing Status:   Future    Standing Expiration Date:   07/12/2020  . TSH    Standing Status:   Future    Standing Expiration Date:   07/12/2020  . Basic metabolic panel    Standing Status:   Future    Standing Expiration Date:   07/12/2020  . Hemoglobin A1c    Standing Status:   Future    Standing Expiration Date:   07/12/2020    No orders of the defined types were placed in this encounter.    I discussed the assessment and treatment plan with the patient. The patient was provided an opportunity to ask questions and all were answered. The patient agreed with the plan and demonstrated an understanding of the instructions.   The patient was advised to call back or seek an in-person evaluation if the symptoms worsen or if the condition fails to improve as anticipated.   Einar Pheasant, MD

## 2019-07-13 NOTE — Patient Instructions (Addendum)
  Ms. Grivas , Thank you for taking time to come for your Medicare Wellness Visit. I appreciate your ongoing commitment to your health goals. Please review the following plan we discussed and let me know if I can assist you in the future.   These are the goals we discussed: Goals      Patient Stated   . Increase physical activity (pt-stated)     Walk for exercise       This is a list of the screening recommended for you and due dates:  Health Maintenance  Topic Date Due  . Mammogram  05/04/2020  . Tetanus Vaccine  01/23/2021  . Colon Cancer Screening  12/07/2024  . Flu Shot  Completed  . DEXA scan (bone density measurement)  Completed  .  Hepatitis C: One time screening is recommended by Center for Disease Control  (CDC) for  adults born from [birth year] through 1967.   Completed  . Pneumonia vaccines  Completed

## 2019-07-13 NOTE — Progress Notes (Addendum)
Subjective:   Cheyenne Baker is a 69 y.o. female who presents for Medicare Annual (Subsequent) preventive examination.  Review of Systems:  No ROS.  Medicare Wellness Virtual Visit.  Visual/audio telehealth visit, UTA vital signs.   Ht/Wt provided.  See social history for additional risk factors.   Cardiac Risk Factors include: advanced age (>60men, >87 women);hypertension     Objective:     Vitals: Ht 5\' 4"  (1.626 m)   Wt 136 lb (61.7 kg)   BMI 23.34 kg/m   Body mass index is 23.34 kg/m.  Advanced Directives 07/13/2019 07/09/2018 05/02/2018 04/17/2018 07/04/2017 06/22/2016 12/08/2014  Does Patient Have a Medical Advance Directive? Yes Yes Yes No;Yes Yes No No  Type of Paramedic of [place];Living will [place];Living will [place];Living will [place];Living will [place];Living will - -  Does patient want to make changes to medical advance directive? No - Patient declined No - Patient declined No - Patient declined No - Patient declined No - Patient declined Yes (MAU/Ambulatory/Procedural Areas - Information given) -  Copy of [place] in Chart? No - copy requested No - copy requested No - copy requested No - copy requested No - copy requested No - copy requested -  Would patient like information on creating a medical advance directive? - - No - Patient declined No - Patient declined - - -    Tobacco Social History   Tobacco Use  Smoking Status Former Smoker  . Packs/day: 0.50  . Years: 20.00  . Pack years: 10.00  . Types: Cigarettes  Smokeless Tobacco Never Used     Counseling given: Not Answered   Clinical Intake:  Pre-visit preparation completed: Yes           How often do you need to have someone help you when you read instructions, pamphlets, or other written materials from your doctor or pharmacy?: 1 - Never  Interpreter Needed?:  No     Past Medical History:  Diagnosis Date  . Allergy   . Hyperlipidemia   . Hypertension    Past Surgical History:  Procedure Laterality Date  . ABDOMINAL HYSTERECTOMY  1980   ovaries not removed, secondary to bleeding  . CHOLECYSTECTOMY  1995  . COLONOSCOPY    . TONSILLECTOMY AND ADENOIDECTOMY  1975   Family History  Problem Relation Age of Onset  . Hypertension Mother   . Hyperlipidemia Mother   . Heart disease Father   . Hypertension Father   . Colon cancer Neg Hx   . Colon polyps Neg Hx   . Rectal cancer Neg Hx   . Esophageal cancer Neg Hx   . Stomach cancer Neg Hx   . Breast cancer Neg Hx    Social History   Socioeconomic History  . Marital status: Widowed    Spouse name: Not on file  . Number of children: Not on file  . Years of education: Not on file  . Highest education level: Not on file  Occupational History  . Not on file  Tobacco Use  . Smoking status: Former Smoker    Packs/day: 0.50    Years: 20.00    Pack years: 10.00    Types: Cigarettes  . Smokeless tobacco: Never Used  Substance and Sexual Activity  . Alcohol use: Yes    Alcohol/week: 0.0 standard drinks    Comment: occasionally  . Drug use: No  . Sexual  activity: Yes  Other Topics Concern  . Not on file  Social History Narrative  . Not on file   Social Determinants of Health   Financial Resource Strain: Low Risk   . Difficulty of Paying Living Expenses: Not hard at all  Food Insecurity: No Food Insecurity  . Worried About Charity fundraiser in the Last Year: Never true  . Ran Out of Food in the Last Year: Never true  Transportation Needs: No Transportation Needs  . Lack of Transportation (Medical): No  . Lack of Transportation (Non-Medical): No  Physical Activity: Unknown  . Days of Exercise per Week: 0 days  . Minutes of Exercise per Session: Not on file  Stress: No Stress Concern Present  . Feeling of Stress : Not at all  Social Connections: Unknown  . Frequency of  Communication with Friends and Family: More than three times a week  . Frequency of Social Gatherings with Friends and Family: More than three times a week  . Attends Religious Services: Not on file  . Active Member of Clubs or Organizations: Not on file  . Attends Archivist Meetings: Not on file  . Marital Status: Widowed    Outpatient Encounter Medications as of 07/13/2019  Medication Sig  . amLODipine (NORVASC) 5 MG tablet TAKE ONE TABLET EVERY DAY  . aspirin EC 81 MG tablet Take 81 mg by mouth daily.  Marland Kitchen atenolol (TENORMIN) 50 MG tablet TAKE ONE TABLET EVERY DAY  . fluticasone (FLONASE) 50 MCG/ACT nasal spray Place 2 sprays into both nostrils daily.  . hydrochlorothiazide (HYDRODIURIL) 12.5 MG tablet TAKE 1 TABLET BY MOUTH DAILY WITH LOSARTAN  . losartan (COZAAR) 100 MG tablet TAKE 1 TABLET BY MOUTH DAILY WITH HCTZ  . losartan-hydrochlorothiazide (HYZAAR) 100-12.5 MG tablet TAKE ONE TABLET BY MOUTH EVERY DAY  . potassium chloride (KLOR-CON) 10 MEQ tablet TAKE ONE TABLET EVERY DAY  . pravastatin (PRAVACHOL) 20 MG tablet TAKE ONE TABLET EVERY DAY   No facility-administered encounter medications on file as of 07/13/2019.    Activities of Daily Living In your present state of health, do you have any difficulty performing the following activities: 07/13/2019  Hearing? N  Vision? N  Difficulty concentrating or making decisions? N  Walking or climbing stairs? N  Dressing or bathing? N  Doing errands, shopping? N  Preparing Food and eating ? N  Using the Toilet? N  In the past six months, have you accidently leaked urine? N  Do you have problems with loss of bowel control? N  Managing your Medications? N  Managing your Finances? N  Housekeeping or managing your Housekeeping? N  Some recent data might be hidden    Patient Care Team: Einar Pheasant, MD as PCP - General (Internal Medicine)    Assessment:   This is a routine wellness examination for Cheyenne Baker.  Nurse  connected with patient 07/13/19 at  9:30 AM EST by a telephone enabled telemedicine application and verified that I am speaking with the correct person using two identifiers. Patient stated full name and DOB. Patient gave permission to continue with virtual visit. Patient's location was at home and Nurse's location was at [place] office.   Patient is alert and oriented x3. Patient denies difficulty focusing or concentrating.  Health Maintenance Due: See completed HM at the end of note.   Eye: Visual acuity not assessed. Virtual visit. Followed by their ophthalmologist.  Dental: UTD   Hearing: Demonstrates normal hearing during visit.  Safety:  Patient  feels safe at home- yes Patient does have smoke detectors at home- yes Patient does wear sunscreen or protective clothing when in direct sunlight - yes Patient does wear seat belt when in a moving vehicle - yes Patient drives- yes Adequate lighting in walkways free from debris- yes Grab bars and handrails used as appropriate- yes Ambulates with an assistive device- no Cell phone on person when ambulating outside of the home- yes  Social: Alcohol intake - yes      Smoking history- former  Smokers in home? none Illicit drug use? none  Medication: Taking as directed and without issues.  Pill box in use -yes  Self managed - yes   Covid-19: Precautions and sickness symptoms discussed. Wears mask, social distancing, hand hygiene as appropriate.   Activities of Daily Living Patient denies needing assistance with: household chores, feeding themselves, getting from bed to chair, getting to the toilet, bathing/showering, dressing, managing money, or preparing meals.   Discussed the importance of a healthy diet, water intake and the benefits of aerobic exercise.   Physical activity- walking  Diet:  Regular; modified Water: good intake Caffeine: none  Other Providers Patient Care Team: Einar Pheasant, MD as PCP - General  (Internal Medicine)  Exercise Activities and Dietary recommendations Current Exercise Habits: Home exercise routine, Type of exercise: walking;calisthenics(dancing)  Goals      Patient Stated   . Increase physical activity (pt-stated)     Walk for exercise       Fall Risk Fall Risk  07/13/2019 07/09/2018 07/04/2017 06/22/2016 10/04/2015  Falls in the past year? 0 0 No No No  Follow up Falls evaluation completed - - - -   Timed Get Up and Go performed: no, virtual visit  Depression Screen PHQ 2/9 Scores 07/13/2019 07/09/2018 07/04/2017 06/22/2016  PHQ - 2 Score 0 0 0 0  PHQ- 9 Score - - 0 -     Cognitive Function MMSE - Mini Mental State Exam 07/13/2019 07/09/2018 07/04/2017 06/22/2016  Not completed: Unable to complete - - -  Orientation to time - 5 5 5   Orientation to Place - 5 5 5   Registration - 3 3 3   Attention/ Calculation - 5 5 5   Recall - 3 3 3   Language- name 2 objects - 2 2 2   Language- repeat - 1 1 1   Language- follow 3 step command - 3 3 3   Language- read & follow direction - 1 1 1   Write a sentence - 1 1 1   Copy design - 1 1 1   Total score - 30 30 30         Immunization History  Administered Date(s) Administered  . Fluad Quad(high Dose 65+) 03/05/2019  . Influenza, High Dose Seasonal PF 06/22/2016, 07/04/2017, 05/20/2018, 03/05/2019  . Pneumococcal Conjugate-13 09/28/2014  . Pneumococcal Polysaccharide-23 10/04/2016  . Zoster 10/26/2013  . Zoster Recombinat (Shingrix) 03/11/2018, 06/26/2018   Screening Tests Health Maintenance  Topic Date Due  . MAMMOGRAM  05/04/2020  . TETANUS/TDAP  01/23/2021  . COLONOSCOPY  12/07/2024  . INFLUENZA VACCINE  Completed  . DEXA SCAN  Completed  . Hepatitis C Screening  Completed  . PNA vac Low Risk Adult  Completed      Plan:   Keep all routine maintenance appointments.   Follow up with your doctor today @ 10:00  Medicare Attestation I have personally reviewed: The patient's medical and social history Their  use of alcohol, tobacco or illicit drugs Their current medications and supplements The patient's  functional ability including ADLs,fall risks, home safety risks, cognitive, and hearing and visual impairment Diet and physical activities Evidence for depression   I have reviewed and discussed with patient certain preventive protocols, quality metrics, and [facility] practice recommendations.   Varney Biles, LPN  [id number]   Reviewed above information.  Agree with assessment and plan.   Dr Nicki Reaper

## 2019-07-15 ENCOUNTER — Telehealth: Payer: Self-pay | Admitting: Internal Medicine

## 2019-07-15 NOTE — Telephone Encounter (Signed)
Pt called to let Dr. Nicki Reaper know that she got her first [place]

## 2019-07-16 NOTE — Telephone Encounter (Signed)
Added to chart. Pt scheduled for second vaccine on 08/12/19

## 2019-07-16 NOTE — Assessment & Plan Note (Signed)
Doing well.  In a new relationship.  Follow.

## 2019-07-16 NOTE — Assessment & Plan Note (Signed)
On potassium and atenolol.  No further episodes of increased heart rate or palpitations.  Follow.

## 2019-07-16 NOTE — Assessment & Plan Note (Signed)
On pravastatin.  Low cholesterol diet and exercise.  Follow lipid panel and liver function tests.   

## 2019-07-16 NOTE — Assessment & Plan Note (Signed)
Blood pressure under good control.  Continue same medication regimen.  Follow pressures.  Follow metabolic panel.   

## 2019-07-16 NOTE — Assessment & Plan Note (Signed)
Has been followed by ENT and Dr Solum.  Biopsy - benign colloid nodule.  Follow tsh.   

## 2019-07-16 NOTE — Assessment & Plan Note (Signed)
Follow met b and a1c.  

## 2019-08-18 ENCOUNTER — Other Ambulatory Visit (INDEPENDENT_AMBULATORY_CARE_PROVIDER_SITE_OTHER): Payer: Medicare Other

## 2019-08-18 ENCOUNTER — Other Ambulatory Visit: Payer: Self-pay

## 2019-08-18 DIAGNOSIS — I10 Essential (primary) hypertension: Secondary | ICD-10-CM | POA: Diagnosis not present

## 2019-08-18 DIAGNOSIS — E7800 Pure hypercholesterolemia, unspecified: Secondary | ICD-10-CM | POA: Diagnosis not present

## 2019-08-18 DIAGNOSIS — R739 Hyperglycemia, unspecified: Secondary | ICD-10-CM

## 2019-08-18 LAB — CBC WITH DIFFERENTIAL/PLATELET
Basophils Absolute: 0.1 10*3/uL (ref 0.0–0.1)
Basophils Relative: 0.6 % (ref 0.0–3.0)
Eosinophils Absolute: 0.2 10*3/uL (ref 0.0–0.7)
Eosinophils Relative: 1.8 % (ref 0.0–5.0)
HCT: 42.2 % (ref 36.0–46.0)
Hemoglobin: 14.3 g/dL (ref 12.0–15.0)
Lymphocytes Relative: 16.2 % (ref 12.0–46.0)
Lymphs Abs: 1.5 10*3/uL (ref 0.7–4.0)
MCHC: 33.9 g/dL (ref 30.0–36.0)
MCV: 92 fl (ref 78.0–100.0)
Monocytes Absolute: 0.6 10*3/uL (ref 0.1–1.0)
Monocytes Relative: 6.4 % (ref 3.0–12.0)
Neutro Abs: 6.9 10*3/uL (ref 1.4–7.7)
Neutrophils Relative %: 75 % (ref 43.0–77.0)
Platelets: 337 10*3/uL (ref 150.0–400.0)
RBC: 4.58 Mil/uL (ref 3.87–5.11)
RDW: 13.3 % (ref 11.5–15.5)
WBC: 9.2 10*3/uL (ref 4.0–10.5)

## 2019-08-18 LAB — BASIC METABOLIC PANEL
BUN: 19 mg/dL (ref 6–23)
CO2: 29 mEq/L (ref 19–32)
Calcium: 10.1 mg/dL (ref 8.4–10.5)
Chloride: 101 mEq/L (ref 96–112)
Creatinine, Ser: 0.92 mg/dL (ref 0.40–1.20)
GFR: 60.18 mL/min (ref >60.00)
Glucose, Bld: 103 mg/dL — ABNORMAL HIGH (ref 70–99)
Potassium: 3.7 mEq/L (ref 3.5–5.1)
Sodium: 136 mEq/L (ref 135–145)

## 2019-08-18 LAB — HEPATIC FUNCTION PANEL
ALT: 18 U/L (ref 0–35)
AST: 18 U/L (ref 0–37)
Albumin: 4.5 g/dL (ref 3.5–5.2)
Alkaline Phosphatase: 47 U/L (ref 39–117)
Bilirubin, Direct: 0.1 mg/dL (ref 0.0–0.3)
Total Bilirubin: 0.6 mg/dL (ref 0.2–1.2)
Total Protein: 7.7 g/dL (ref 6.0–8.3)

## 2019-08-18 LAB — TSH: TSH: 1.34 u[IU]/mL (ref 0.35–4.50)

## 2019-08-18 LAB — LIPID PANEL
Cholesterol: 211 mg/dL — ABNORMAL HIGH (ref 0–200)
HDL: 57.3 mg/dL (ref >39.00)
LDL Cholesterol: 130 mg/dL — ABNORMAL HIGH (ref 0–99)
NonHDL: 153.63
Total CHOL/HDL Ratio: 4
Triglycerides: 117 mg/dL (ref 0.0–149.0)
VLDL: 23.4 mg/dL (ref 0.0–40.0)

## 2019-08-20 LAB — HEMOGLOBIN A1C: Hgb A1c MFr Bld: 5.8 % (ref 4.6–6.5)

## 2019-08-21 ENCOUNTER — Telehealth: Payer: Self-pay

## 2019-08-21 MED ORDER — PRAVASTATIN SODIUM 40 MG PO TABS: 40.0000 mg | ORAL_TABLET | Freq: Every day | ORAL | 1 refills | Status: DC

## 2019-08-21 NOTE — Telephone Encounter (Signed)
Pravastatin rx sent in per result note message.

## 2019-08-21 NOTE — Telephone Encounter (Signed)
-----   Message from Einar Pheasant, MD sent at 08/20/2019 10:39 PM EST ----- Please call pt and notify he that her cholesterol has increased when compared to the previous check.  Please confirm taking pravastatin 20mg  q day and not skipping any doses.  If so, then change to pravastatin 40mg  q day.  Also, overall sugar control increased from previous check.  Recommend continuing a low carb diet and exercise.  Will follow.  Hgb, thyroid test and liver function tests are wnl.

## 2019-09-04 ENCOUNTER — Telehealth: Payer: Self-pay | Admitting: Internal Medicine

## 2019-09-04 NOTE — Telephone Encounter (Signed)
Please confirm why she went off zyrtec.  Also, it is otc.  Does she need rx.  Confirm doing ok.

## 2019-09-04 NOTE — Telephone Encounter (Signed)
Pt would like to be put back on Zyrtec sent to [facility]

## 2019-09-07 NOTE — Telephone Encounter (Signed)
Pt needs Rx so insurance will cover it. She stopped taking it because her allergies got better but they are acting up again. Please send to [facility]

## 2019-09-08 MED ORDER — CETIRIZINE HCL 10 MG PO TABS: 10.0000 mg | ORAL_TABLET | Freq: Every day | ORAL | 3 refills | Status: DC

## 2019-09-08 NOTE — Telephone Encounter (Signed)
rx sent in for zyrtec

## 2019-09-08 NOTE — Addendum Note (Signed)
Addended by: Alisa Graff on: 09/08/2019 05:02 AM   Modules accepted: Orders

## 2019-09-24 ENCOUNTER — Other Ambulatory Visit: Payer: Self-pay | Admitting: Internal Medicine

## 2019-11-19 ENCOUNTER — Telehealth (INDEPENDENT_AMBULATORY_CARE_PROVIDER_SITE_OTHER): Payer: Medicare Other | Admitting: Internal Medicine

## 2019-11-19 ENCOUNTER — Other Ambulatory Visit: Payer: Self-pay

## 2019-11-19 ENCOUNTER — Encounter: Payer: Self-pay | Admitting: Internal Medicine

## 2019-11-19 DIAGNOSIS — I479 Paroxysmal tachycardia, unspecified: Secondary | ICD-10-CM | POA: Diagnosis not present

## 2019-11-19 DIAGNOSIS — F439 Reaction to severe stress, unspecified: Secondary | ICD-10-CM | POA: Diagnosis not present

## 2019-11-19 DIAGNOSIS — I10 Essential (primary) hypertension: Secondary | ICD-10-CM

## 2019-11-19 DIAGNOSIS — Z9109 Other allergy status, other than to drugs and biological substances: Secondary | ICD-10-CM

## 2019-11-19 DIAGNOSIS — E7800 Pure hypercholesterolemia, unspecified: Secondary | ICD-10-CM | POA: Diagnosis not present

## 2019-11-19 DIAGNOSIS — E049 Nontoxic goiter, unspecified: Secondary | ICD-10-CM

## 2019-11-19 DIAGNOSIS — R739 Hyperglycemia, unspecified: Secondary | ICD-10-CM

## 2019-11-19 MED ORDER — CETIRIZINE HCL 10 MG PO TABS: 10.0000 mg | ORAL_TABLET | Freq: Every day | ORAL | 1 refills | Status: DC

## 2019-11-19 NOTE — Progress Notes (Signed)
Patient ID: Cheyenne Baker, female   DOB: [birth year], 69 y.o.   MRN: [id number]   Virtual Visit via video Note  This visit type was conducted due to national recommendations for restrictions regarding the COVID-19 pandemic (e.g. social distancing).  This format is felt to be most appropriate for this patient at this time.  All issues noted in this document were discussed and addressed.  No physical exam was performed (except for noted visual exam findings with Video Visits).   I connected with Cheyenne Baker by a video enabled telemedicine application and verified that I am speaking with the correct person using two identifiers. Location patient: home Location provider: work  Persons participating in the virtual visit: patient, provider  The limitations, risks, security and privacy concerns of performing an evaluation and management service by video and the availability of in person appointments have been discussed. . It has also been discussed with the patient that there may be a patient responsible charge related to this service. The patient has expressed understanding and  has agreed to proceed.   Reason for visit: scheduled follow up.    HPI: Follow up regarding hypertension and hypercholesterolemia.  No increased heart rate or palpitations.  Taking atenolol.  No chest pain.  Some allergy issues - watery eyes - pollen.  Takes zyrtec.  No increased cough.  Breathing stable.  She is walking.  Increased stress with family medical issues.     ROS: See pertinent positives and negatives per HPI.  Past Medical History:  Diagnosis Date  . Allergy   . Hyperlipidemia   . Hypertension     Past Surgical History:  Procedure Laterality Date  . ABDOMINAL HYSTERECTOMY  1980   ovaries not removed, secondary to bleeding  . CHOLECYSTECTOMY  1995  . COLONOSCOPY    . TONSILLECTOMY AND ADENOIDECTOMY  1975    Family History  Problem Relation Age of Onset  . Hypertension Mother   . Hyperlipidemia  Mother   . Heart disease Father   . Hypertension Father   . Colon cancer Neg Hx   . Colon polyps Neg Hx   . Rectal cancer Neg Hx   . Esophageal cancer Neg Hx   . Stomach cancer Neg Hx   . Breast cancer Neg Hx     SOCIAL HX: reviewed.    Current Outpatient Medications:  .  amLODipine (NORVASC) 5 MG tablet, TAKE ONE TABLET EVERY DAY, Disp: 90 tablet, Rfl: 1 .  aspirin EC 81 MG tablet, Take 81 mg by mouth daily., Disp: , Rfl:  .  atenolol (TENORMIN) 50 MG tablet, TAKE ONE TABLET EVERY DAY, Disp: 90 tablet, Rfl: 1 .  cetirizine (ZYRTEC ALLERGY) 10 MG tablet, Take 1 tablet (10 mg total) by mouth daily., Disp: 90 tablet, Rfl: 1 .  fluticasone (FLONASE) 50 MCG/ACT nasal spray, Place 2 sprays into both nostrils daily., Disp: 16 g, Rfl: 1 .  losartan-hydrochlorothiazide (HYZAAR) 100-12.5 MG tablet, TAKE ONE TABLET EVERY DAY, Disp: 90 tablet, Rfl: 1 .  potassium chloride (KLOR-CON) 10 MEQ tablet, TAKE ONE TABLET EVERY DAY, Disp: 90 tablet, Rfl: 3 .  pravastatin (PRAVACHOL) 40 MG tablet, Take 1 tablet (40 mg total) by mouth daily., Disp: 90 tablet, Rfl: 1  EXAM:  GENERAL: alert, oriented, appears well and in no acute distress  HEENT: atraumatic, conjunttiva clear, no obvious abnormalities on inspection of external nose and ears  NECK: normal movements of the head and neck  LUNGS: on inspection no signs of respiratory distress,  breathing rate appears normal, no obvious gross SOB, gasping or wheezing  CV: no obvious cyanosis  PSYCH/NEURO: pleasant and cooperative, no obvious depression or anxiety, speech and thought processing grossly intact  ASSESSMENT AND PLAN:  Discussed the following assessment and plan:  Stress Increased stress as outlined.  Discussed with her.  Overall handling things well.  Follow.    Paroxysmal tachycardia (HCC) On atenolol and potassium.  No increased heart rate or palpitations.  Follow.    Hyperglycemia Low carb diet and exercise.  Follow met b and a1c.     Hypercholesterolemia On pravastatin.  Low cholesterol diet and exercise.  Follow lipid panel and liver function tests.    Essential hypertension Blood pressure has been under control.  Continue on losartan/hctz and atenolol.  Follow pressures.  Follow metabolic panel.   Environmental allergies Zyrtec.  Follow.   Enlarged thyroid Has been followed by ENT and Dr Gabriel Carina.  Biopsy - benign colloid nodule.  Follow tsh.     Orders Placed This Encounter  Procedures  . Hemoglobin A1c    Standing Status:   Future    Standing Expiration Date:   11/27/2020  . Hepatic function panel    Standing Status:   Future    Standing Expiration Date:   11/27/2020  . Lipid panel    Standing Status:   Future    Standing Expiration Date:   11/27/2020  . Basic metabolic panel    Standing Status:   Future    Standing Expiration Date:   11/27/2020    Meds ordered this encounter  Medications  . cetirizine (ZYRTEC ALLERGY) 10 MG tablet    Sig: Take 1 tablet (10 mg total) by mouth daily.    Dispense:  90 tablet    Refill:  1     I discussed the assessment and treatment plan with the patient. The patient was provided an opportunity to ask questions and all were answered. The patient agreed with the plan and demonstrated an understanding of the instructions.   The patient was advised to call back or seek an in-person evaluation if the symptoms worsen or if the condition fails to improve as anticipated.    Einar Pheasant, MD

## 2019-11-28 ENCOUNTER — Telehealth: Payer: Self-pay | Admitting: Internal Medicine

## 2019-11-28 ENCOUNTER — Encounter: Payer: Self-pay | Admitting: Internal Medicine

## 2019-11-28 NOTE — Assessment & Plan Note (Signed)
Increased stress as outlined.  Discussed with her.  Overall handling things well.  Follow.

## 2019-11-28 NOTE — Assessment & Plan Note (Signed)
Blood pressure has been under control.  Continue on losartan/hctz and atenolol.  Follow pressures.  Follow metabolic panel.

## 2019-11-28 NOTE — Assessment & Plan Note (Signed)
On pravastatin.  Low cholesterol diet and exercise.  Follow lipid panel and liver function tests.   

## 2019-11-28 NOTE — Assessment & Plan Note (Signed)
Has been followed by ENT and Dr Gabriel Carina.  Biopsy - benign colloid nodule.  Follow tsh.

## 2019-11-28 NOTE — Assessment & Plan Note (Signed)
Low carb diet and exercise.  Follow met b and a1c.  

## 2019-11-28 NOTE — Telephone Encounter (Signed)
Seen 11/19/19.  Needs physical scheduled in 4 months.  Also schedule 3-4 weeks fasting lab appt.  Thanks

## 2019-11-28 NOTE — Assessment & Plan Note (Signed)
Zyrtec. Follow.  

## 2019-11-28 NOTE — Assessment & Plan Note (Signed)
On atenolol and potassium.  No increased heart rate or palpitations.  Follow.

## 2019-11-30 NOTE — Telephone Encounter (Signed)
Called and scheduled pt

## 2019-12-15 ENCOUNTER — Other Ambulatory Visit: Payer: Self-pay | Admitting: Internal Medicine

## 2019-12-18 ENCOUNTER — Telehealth: Payer: Self-pay | Admitting: Internal Medicine

## 2019-12-18 NOTE — Telephone Encounter (Signed)
Pt called in wanted to know if Dr.Scott would call her something for Gout in her big toe

## 2019-12-18 NOTE — Telephone Encounter (Signed)
Patient stated her toe has been like this for 2 days but very sore to the touch today. She has only had gout one other time. She is going to the walk-in to be evaluated.

## 2019-12-22 ENCOUNTER — Telehealth: Payer: Self-pay | Admitting: Internal Medicine

## 2019-12-22 DIAGNOSIS — M109 Gout, unspecified: Secondary | ICD-10-CM

## 2019-12-22 NOTE — Telephone Encounter (Signed)
Patient is coming into office tomorrow for labs and would like to have her acid levels checked. She has gout. Pt is aware that note is being sent back for request.

## 2019-12-22 NOTE — Telephone Encounter (Signed)
Pt went to [facility] last week for gout in her toe. Has labs tomorrow. Would like to have her levels checked. I was not sure what all needed to be ordered.

## 2019-12-23 ENCOUNTER — Other Ambulatory Visit: Payer: Self-pay

## 2019-12-23 ENCOUNTER — Other Ambulatory Visit (INDEPENDENT_AMBULATORY_CARE_PROVIDER_SITE_OTHER): Payer: Medicare Other

## 2019-12-23 DIAGNOSIS — E7800 Pure hypercholesterolemia, unspecified: Secondary | ICD-10-CM

## 2019-12-23 DIAGNOSIS — R739 Hyperglycemia, unspecified: Secondary | ICD-10-CM | POA: Diagnosis not present

## 2019-12-23 DIAGNOSIS — M109 Gout, unspecified: Secondary | ICD-10-CM

## 2019-12-23 DIAGNOSIS — I10 Essential (primary) hypertension: Secondary | ICD-10-CM

## 2019-12-23 NOTE — Addendum Note (Signed)
Addended by: Tor Netters I on: 12/23/2019 09:51 AM   Modules accepted: Orders

## 2019-12-23 NOTE — Telephone Encounter (Signed)
All labs were drawn

## 2019-12-23 NOTE — Telephone Encounter (Signed)
Order placed for uric acid lab to be drawn with other labs.  I have sent lab a message.  Please make sure all labs are drawn, not just uric acid lab.

## 2019-12-24 ENCOUNTER — Telehealth: Payer: Self-pay | Admitting: Internal Medicine

## 2019-12-24 LAB — LIPID PANEL
Cholesterol: 190 mg/dL (ref <200)
HDL: 58 mg/dL (ref >=50)
LDL Cholesterol (Calc): 111 mg/dL (calc) — ABNORMAL HIGH
Non-HDL Cholesterol (Calc): 132 mg/dL (calc) — ABNORMAL HIGH (ref <130)
Total CHOL/HDL Ratio: 3.3 (calc) (ref <5.0)
Triglycerides: 104 mg/dL (ref <150)

## 2019-12-24 LAB — BASIC METABOLIC PANEL
BUN: 16 mg/dL (ref 7–25)
CO2: 27 mmol/L (ref 20–32)
Calcium: 10.4 mg/dL (ref 8.6–10.4)
Chloride: 105 mmol/L (ref 98–110)
Creat: 0.93 mg/dL (ref 0.60–0.93)
Glucose, Bld: 111 mg/dL — ABNORMAL HIGH (ref 65–99)
Potassium: 4.1 mmol/L (ref 3.5–5.3)
Sodium: 141 mmol/L (ref 135–146)

## 2019-12-24 LAB — HEMOGLOBIN A1C
Hgb A1c MFr Bld: 5.2 % of total Hgb (ref <5.7)
Mean Plasma Glucose: 103 (calc)
eAG (mmol/L): 5.7 (calc)

## 2019-12-24 LAB — HEPATIC FUNCTION PANEL
AG Ratio: 1.6 (calc) (ref 1.0–2.5)
ALT: 19 U/L (ref 6–29)
AST: 21 U/L (ref 10–35)
Albumin: 4.5 g/dL (ref 3.6–5.1)
Alkaline phosphatase (APISO): 56 U/L (ref 37–153)
Bilirubin, Direct: 0.1 mg/dL (ref 0.0–0.2)
Globulin: 2.9 g/dL (calc) (ref 1.9–3.7)
Indirect Bilirubin: 0.7 mg/dL (calc) (ref 0.2–1.2)
Total Bilirubin: 0.8 mg/dL (ref 0.2–1.2)
Total Protein: 7.4 g/dL (ref 6.1–8.1)

## 2019-12-24 LAB — URIC ACID: Uric Acid, Serum: 6.1 mg/dL (ref 2.5–7.0)

## 2019-12-24 NOTE — Telephone Encounter (Signed)
See result note.  

## 2019-12-24 NOTE — Telephone Encounter (Signed)
Pt called for lab results. She states that her foot is still swollen and painful. Please advise

## 2020-03-14 ENCOUNTER — Other Ambulatory Visit: Payer: Self-pay | Admitting: Internal Medicine

## 2020-04-05 ENCOUNTER — Other Ambulatory Visit: Payer: Self-pay

## 2020-04-05 ENCOUNTER — Ambulatory Visit (INDEPENDENT_AMBULATORY_CARE_PROVIDER_SITE_OTHER): Payer: Medicare Other | Admitting: Internal Medicine

## 2020-04-05 ENCOUNTER — Encounter: Payer: Self-pay | Admitting: Internal Medicine

## 2020-04-05 DIAGNOSIS — Z23 Encounter for immunization: Secondary | ICD-10-CM | POA: Diagnosis not present

## 2020-04-05 DIAGNOSIS — R0989 Other specified symptoms and signs involving the circulatory and respiratory systems: Secondary | ICD-10-CM | POA: Diagnosis not present

## 2020-04-05 DIAGNOSIS — R739 Hyperglycemia, unspecified: Secondary | ICD-10-CM

## 2020-04-05 DIAGNOSIS — Z1231 Encounter for screening mammogram for malignant neoplasm of breast: Secondary | ICD-10-CM | POA: Diagnosis not present

## 2020-04-05 DIAGNOSIS — I479 Paroxysmal tachycardia, unspecified: Secondary | ICD-10-CM

## 2020-04-05 DIAGNOSIS — F439 Reaction to severe stress, unspecified: Secondary | ICD-10-CM | POA: Diagnosis not present

## 2020-04-05 DIAGNOSIS — E049 Nontoxic goiter, unspecified: Secondary | ICD-10-CM

## 2020-04-05 DIAGNOSIS — Z0000 Encounter for general adult medical examination without abnormal findings: Secondary | ICD-10-CM

## 2020-04-05 DIAGNOSIS — Z1211 Encounter for screening for malignant neoplasm of colon: Secondary | ICD-10-CM | POA: Diagnosis not present

## 2020-04-05 DIAGNOSIS — E7800 Pure hypercholesterolemia, unspecified: Secondary | ICD-10-CM | POA: Diagnosis not present

## 2020-04-05 DIAGNOSIS — I10 Essential (primary) hypertension: Secondary | ICD-10-CM | POA: Diagnosis not present

## 2020-04-05 LAB — LIPID PANEL
Cholesterol: 198 mg/dL (ref 0–200)
HDL: 63.6 mg/dL (ref >39.00)
LDL Cholesterol: 108 mg/dL — ABNORMAL HIGH (ref 0–99)
NonHDL: 134.07
Total CHOL/HDL Ratio: 3
Triglycerides: 131 mg/dL (ref 0.0–149.0)
VLDL: 26.2 mg/dL (ref 0.0–40.0)

## 2020-04-05 LAB — BASIC METABOLIC PANEL
BUN: 16 mg/dL (ref 6–23)
CO2: 28 mEq/L (ref 19–32)
Calcium: 10.3 mg/dL (ref 8.4–10.5)
Chloride: 104 mEq/L (ref 96–112)
Creatinine, Ser: 0.95 mg/dL (ref 0.40–1.20)
GFR: 59.99 mL/min — ABNORMAL LOW (ref >60.00)
Glucose, Bld: 93 mg/dL (ref 70–99)
Potassium: 3.9 mEq/L (ref 3.5–5.1)
Sodium: 140 mEq/L (ref 135–145)

## 2020-04-05 LAB — HEPATIC FUNCTION PANEL
ALT: 15 U/L (ref 0–35)
AST: 18 U/L (ref 0–37)
Albumin: 4.3 g/dL (ref 3.5–5.2)
Alkaline Phosphatase: 52 U/L (ref 39–117)
Bilirubin, Direct: 0.1 mg/dL (ref 0.0–0.3)
Total Bilirubin: 0.7 mg/dL (ref 0.2–1.2)
Total Protein: 7.3 g/dL (ref 6.0–8.3)

## 2020-04-05 LAB — T4, FREE: Free T4: 0.63 ng/dL (ref 0.60–1.60)

## 2020-04-05 LAB — TSH: TSH: 1.54 u[IU]/mL (ref 0.35–4.50)

## 2020-04-05 LAB — HEMOGLOBIN A1C: Hgb A1c MFr Bld: 5.6 % (ref 4.6–6.5)

## 2020-04-05 NOTE — Progress Notes (Signed)
Patient ID: Cheyenne Baker, female   DOB: July 25, 1950, 69 y.o.   MRN: [id number]   Subjective:    Patient ID: Cheyenne Baker, female    DOB: 1950-10-07, 69 y.o.   MRN: [id number]  HPI This visit occurred during the SARS-CoV-2 public health emergency.  Safety protocols were in place, including screening questions prior to the visit, additional usage of staff PPE, and extensive cleaning of exam room while observing appropriate contact time as indicated for disinfecting solutions.  Patient with past history of tachycardia, hypertension and hypercholesterolemia.  She comes in today to follow up on these issues as well as for a complete physical exam.  She is doing well.  Increased stress with her mother's health issues.  Mother had stroke - blind now.  Overall she feels she is handling things well.  No chest pain or sob reported.  No abdominal pain or bowel change reported. Recently got engaged.  Happy.  Discussed due f/u mammogram.    Past Medical History:  Diagnosis Date  . Allergy   . Hyperlipidemia   . Hypertension    Past Surgical History:  Procedure Laterality Date  . ABDOMINAL HYSTERECTOMY  1980   ovaries not removed, secondary to bleeding  . CHOLECYSTECTOMY  1995  . COLONOSCOPY    . TONSILLECTOMY AND ADENOIDECTOMY  1975   Family History  Problem Relation Age of Onset  . Hypertension Mother   . Hyperlipidemia Mother   . Heart disease Father   . Hypertension Father   . Colon cancer Neg Hx   . Colon polyps Neg Hx   . Rectal cancer Neg Hx   . Esophageal cancer Neg Hx   . Stomach cancer Neg Hx   . Breast cancer Neg Hx    Social History   Socioeconomic History  . Marital status: Widowed    Spouse name: Not on file  . Number of children: Not on file  . Years of education: Not on file  . Highest education level: Not on file  Occupational History  . Not on file  Tobacco Use  . Smoking status: Former Smoker    Packs/day: 0.50    Years: 20.00    Pack years: 10.00    Types:  Cigarettes  . Smokeless tobacco: Never Used  Vaping Use  . Vaping Use: Never used  Substance and Sexual Activity  . Alcohol use: Yes    Alcohol/week: 0.0 standard drinks    Comment: occasionally  . Drug use: No  . Sexual activity: Yes  Other Topics Concern  . Not on file  Social History Narrative  . Not on file   Social Determinants of Health   Financial Resource Strain: Low Risk   . Difficulty of Paying Living Expenses: Not hard at all  Food Insecurity: No Food Insecurity  . Worried About Charity fundraiser in the Last Year: Never true  . Ran Out of Food in the Last Year: Never true  Transportation Needs: No Transportation Needs  . Lack of Transportation (Medical): No  . Lack of Transportation (Non-Medical): No  Physical Activity: Unknown  . Days of Exercise per Week: 0 days  . Minutes of Exercise per Session: Not on file  Stress: No Stress Concern Present  . Feeling of Stress : Not at all  Social Connections: Unknown  . Frequency of Communication with Friends and Family: More than three times a week  . Frequency of Social Gatherings with Friends and Family: More than three times a week  .  Attends Religious Services: Not on file  . Active Member of Clubs or Organizations: Not on file  . Attends Archivist Meetings: Not on file  . Marital Status: Widowed    Outpatient Encounter Medications as of 04/05/2020  Medication Sig  . amLODipine (NORVASC) 5 MG tablet TAKE 1 TABLET BY MOUTH DAILY  . aspirin EC 81 MG tablet Take 81 mg by mouth daily.  Marland Kitchen atenolol (TENORMIN) 50 MG tablet TAKE 1 TABLET BY MOUTH DAILY  . cetirizine (ZYRTEC ALLERGY) 10 MG tablet Take 1 tablet (10 mg total) by mouth daily.  . fluticasone (FLONASE) 50 MCG/ACT nasal spray Place 2 sprays into both nostrils daily.  Marland Kitchen losartan-hydrochlorothiazide (HYZAAR) 100-12.5 MG tablet TAKE ONE TABLET EVERY DAY  . potassium chloride (KLOR-CON) 10 MEQ tablet TAKE ONE TABLET EVERY DAY  . pravastatin (PRAVACHOL)  40 MG tablet TAKE ONE TABLET BY MOUTH EVERY DAY   No facility-administered encounter medications on file as of 04/05/2020.    Review of Systems  Constitutional: Negative for appetite change and unexpected weight change.  HENT: Negative for congestion, sinus pressure and sore throat.   Eyes: Negative for pain and visual disturbance.  Respiratory: Negative for cough, chest tightness and shortness of breath.   Cardiovascular: Negative for chest pain, palpitations and leg swelling.  Gastrointestinal: Negative for abdominal pain, constipation, diarrhea and vomiting.  Genitourinary: Negative for difficulty urinating and dysuria.  Musculoskeletal: Negative for joint swelling and myalgias.  Skin: Negative for color change and rash.  Neurological: Negative for dizziness, light-headedness and headaches.  Hematological: Does not bruise/bleed easily.  Psychiatric/Behavioral: Negative for agitation and dysphoric mood.       Objective:    Physical Exam Vitals reviewed.  Constitutional:      General: She is not in acute distress.    Appearance: Normal appearance. She is well-developed.  HENT:     Head: Normocephalic and atraumatic.     Right Ear: External ear normal.     Left Ear: External ear normal.  Eyes:     General: No scleral icterus.       Right eye: No discharge.        Left eye: No discharge.     Conjunctiva/sclera: Conjunctivae normal.  Neck:     Thyroid: No thyromegaly.  Cardiovascular:     Rate and Rhythm: Normal rate and regular rhythm.     Comments: Right carotid bruit Pulmonary:     Effort: No tachypnea, accessory muscle usage or respiratory distress.     Breath sounds: Normal breath sounds. No decreased breath sounds or wheezing.  Chest:     Breasts:        Right: No inverted nipple, mass, nipple discharge or tenderness (no axillary adenopathy).        Left: No inverted nipple, mass, nipple discharge or tenderness (no axilarry adenopathy).  Abdominal:     General:  Bowel sounds are normal.     Palpations: Abdomen is soft.     Tenderness: There is no abdominal tenderness.  Musculoskeletal:        General: No swelling or tenderness.     Cervical back: Neck supple. No tenderness.  Lymphadenopathy:     Cervical: No cervical adenopathy.  Skin:    Findings: No erythema or rash.  Neurological:     Mental Status: She is alert and oriented to person, place, and time.  Psychiatric:        Mood and Affect: Mood normal.  Behavior: Behavior normal.     BP (!) 142/78   Pulse 68   Temp 98.3 F (36.8 C) (Oral)   Resp 16   Ht 5' 4" (1.626 m)   Wt 148 lb 3.2 oz (67.2 kg)   SpO2 98%   BMI 25.44 kg/m  Wt Readings from Last 3 Encounters:  04/05/20 148 lb 3.2 oz (67.2 kg)  11/19/19 144 lb (65.3 kg)  07/13/19 129 lb (58.5 kg)     Lab Results  Component Value Date   WBC 9.2 08/18/2019   HGB 14.3 08/18/2019   HCT 42.2 08/18/2019   PLT 337.0 08/18/2019   GLUCOSE 93 04/05/2020   CHOL 198 04/05/2020   TRIG 131.0 04/05/2020   HDL 63.60 04/05/2020   LDLDIRECT 143.3 06/11/2013   LDLCALC 108 (H) 04/05/2020   ALT 15 04/05/2020   AST 18 04/05/2020   NA 140 04/05/2020   K 3.9 04/05/2020   CL 104 04/05/2020   CREATININE 0.95 04/05/2020   BUN 16 04/05/2020   CO2 28 04/05/2020   TSH 1.54 04/05/2020   HGBA1C 5.6 04/05/2020    MM 3D SCREEN BREAST BILATERAL  Result Date: 05/06/2019 CLINICAL DATA:  Screening. EXAM: DIGITAL SCREENING BILATERAL MAMMOGRAM WITH TOMO AND CAD COMPARISON:  Previous exam(s). ACR Breast Density Category c: The breast tissue is heterogeneously dense, which may obscure small masses. FINDINGS: There are no findings suspicious for malignancy. Images were processed with CAD. IMPRESSION: No mammographic evidence of malignancy. A result letter of this screening mammogram will be mailed directly to the patient. RECOMMENDATION: Screening mammogram in one year. (Code:SM-B-01Y) BI-RADS CATEGORY  1: Negative. Electronically Signed   By:  Drew  Davis M.D.   On: 05/06/2019 12:54       Assessment & Plan:   Problem List Items Addressed This Visit    Stress    Increased stress.  Discussed with her today.  Overall she feels she is doing relatively well.  Does not feel she needs any further intervention at this time.  Follow.        Right carotid bruit    Right carotid bruit noted on exam.  Check carotid ultrasound.        Relevant Orders   VAS US CAROTID   Paroxysmal tachycardia (HCC)    On atenolol.  No increased heart rate or palpitations.  Follow.        Hyperglycemia    Low carb diet and exercise.  Follow met b and a1c.       Relevant Orders   Hemoglobin A1c (Completed)   Hypercholesterolemia    On pravastatin.  Low cholesterol diet and exercise.  Follow lipid panel and liver function tests.  Had discussed starting crestor.  Will check lipid panel today.        Relevant Orders   Lipid panel (Completed)   Hepatic function panel (Completed)   Health care maintenance    Physical today 04/05/20.  Mammogram 05/06/19 - Birads I.  Colonoscopy 11/2014 - recommended f/u in 10 years. (Dr Pyrtle).  Schedule f/u mammogram.        Essential hypertension    Blood pressure slightly elevated today.  On losartan/hctz and atenolol.  Have her spot check her pressure.  Get her back in soon to reassess.  Hold on adjusting medication - until can review trend of pressures.        Relevant Orders   Basic metabolic panel (Completed)   Enlarged thyroid    Has been followed by ENT   and Dr Gabriel Carina.  Biopsy - benign colloid nodule.  Check tsh and free T4.  Discussed f/u.       Relevant Orders   TSH (Completed)   T4, free (Completed)    Other Visit Diagnoses    Visit for screening mammogram       Relevant Orders   MM 3D SCREEN BREAST BILATERAL   Colon cancer screening       Relevant Orders   Fecal occult blood, imunochemical   Need for immunization against influenza       Relevant Orders   Flu Vaccine QUAD High Dose(Fluad)  (Completed)       Einar Pheasant, MD

## 2020-04-05 NOTE — Assessment & Plan Note (Signed)
Physical today 04/05/20.  Mammogram 05/06/19 - Birads I.  Colonoscopy 11/2014 - recommended f/u in 10 years. (Dr Hilarie Fredrickson).  Schedule f/u mammogram.

## 2020-04-06 ENCOUNTER — Encounter: Payer: Self-pay | Admitting: Internal Medicine

## 2020-04-06 DIAGNOSIS — R0989 Other specified symptoms and signs involving the circulatory and respiratory systems: Secondary | ICD-10-CM | POA: Insufficient documentation

## 2020-04-06 NOTE — Assessment & Plan Note (Signed)
Increased stress.  Discussed with her today.  Overall she feels she is doing relatively well.  Does not feel she needs any further intervention at this time.  Follow.   °

## 2020-04-06 NOTE — Assessment & Plan Note (Signed)
On pravastatin.  Low cholesterol diet and exercise.  Follow lipid panel and liver function tests.  Had discussed starting crestor.  Will check lipid panel today.

## 2020-04-06 NOTE — Assessment & Plan Note (Signed)
On atenolol.  No increased heart rate or palpitations.  Follow.   

## 2020-04-06 NOTE — Assessment & Plan Note (Signed)
Blood pressure slightly elevated today.  On losartan/hctz and atenolol.  Have her spot check her pressure.  Get her back in soon to reassess.  Hold on adjusting medication - until can review trend of pressures.   °

## 2020-04-06 NOTE — Assessment & Plan Note (Signed)
Low carb diet and exercise.  Follow met b and a1c.  

## 2020-04-06 NOTE — Assessment & Plan Note (Signed)
Right carotid bruit noted on exam.  Check carotid ultrasound.

## 2020-04-06 NOTE — Assessment & Plan Note (Signed)
Has been followed by ENT and Dr Gabriel Carina.  Biopsy - benign colloid nodule.  Check tsh and free T4.  Discussed f/u.

## 2020-04-07 ENCOUNTER — Other Ambulatory Visit: Payer: Self-pay

## 2020-04-07 DIAGNOSIS — E7800 Pure hypercholesterolemia, unspecified: Secondary | ICD-10-CM

## 2020-04-07 MED ORDER — ROSUVASTATIN CALCIUM 20 MG PO TABS: 20.0000 mg | ORAL_TABLET | Freq: Every day | ORAL | 2 refills | Status: DC

## 2020-04-13 ENCOUNTER — Other Ambulatory Visit (INDEPENDENT_AMBULATORY_CARE_PROVIDER_SITE_OTHER): Payer: Medicare Other

## 2020-04-13 DIAGNOSIS — Z1211 Encounter for screening for malignant neoplasm of colon: Secondary | ICD-10-CM

## 2020-04-13 LAB — FECAL OCCULT BLOOD, IMMUNOCHEMICAL: Fecal Occult Bld: NEGATIVE

## 2020-05-12 ENCOUNTER — Ambulatory Visit
Admission: RE | Admit: 2020-05-12 | Discharge: 2020-05-12 | Disposition: A | Discharge status: Home / Self Care | Payer: Medicare Other | Source: Ambulatory Visit | Attending: Internal Medicine | Admitting: Internal Medicine

## 2020-05-12 ENCOUNTER — Other Ambulatory Visit: Payer: Self-pay

## 2020-05-12 DIAGNOSIS — Z1231 Encounter for screening mammogram for malignant neoplasm of breast: Secondary | ICD-10-CM | POA: Diagnosis not present

## 2020-05-18 ENCOUNTER — Other Ambulatory Visit (INDEPENDENT_AMBULATORY_CARE_PROVIDER_SITE_OTHER): Payer: Medicare Other

## 2020-05-18 ENCOUNTER — Other Ambulatory Visit: Payer: Self-pay

## 2020-05-18 DIAGNOSIS — E7800 Pure hypercholesterolemia, unspecified: Secondary | ICD-10-CM | POA: Diagnosis not present

## 2020-05-18 LAB — HEPATIC FUNCTION PANEL
ALT: 16 U/L (ref 0–35)
AST: 19 U/L (ref 0–37)
Albumin: 4.2 g/dL (ref 3.5–5.2)
Alkaline Phosphatase: 49 U/L (ref 39–117)
Bilirubin, Direct: 0.1 mg/dL (ref 0.0–0.3)
Total Bilirubin: 0.6 mg/dL (ref 0.2–1.2)
Total Protein: 7.5 g/dL (ref 6.0–8.3)

## 2020-05-18 NOTE — Progress Notes (Signed)
labs

## 2020-06-13 ENCOUNTER — Other Ambulatory Visit: Payer: Self-pay | Admitting: Internal Medicine

## 2020-06-13 ENCOUNTER — Other Ambulatory Visit: Payer: Self-pay | Admitting: Cardiovascular Disease

## 2020-06-13 NOTE — Telephone Encounter (Signed)
Patient called in about her new cholesterol medication

## 2020-06-28 ENCOUNTER — Ambulatory Visit (INDEPENDENT_AMBULATORY_CARE_PROVIDER_SITE_OTHER): Payer: Medicare Other

## 2020-06-28 ENCOUNTER — Other Ambulatory Visit: Payer: Self-pay

## 2020-06-28 DIAGNOSIS — R0989 Other specified symptoms and signs involving the circulatory and respiratory systems: Secondary | ICD-10-CM

## 2020-07-13 ENCOUNTER — Ambulatory Visit: Payer: Medicare Other

## 2020-07-13 ENCOUNTER — Ambulatory Visit (INDEPENDENT_AMBULATORY_CARE_PROVIDER_SITE_OTHER): Payer: Medicare Other

## 2020-07-13 VITALS — Ht 64.0 in | Wt 148.0 lb

## 2020-07-13 DIAGNOSIS — Z0000 Encounter for general adult medical examination without abnormal findings: Secondary | ICD-10-CM

## 2020-07-13 NOTE — Progress Notes (Signed)
Subjective:   Cheyenne Baker is a 69 y.o. female who presents for Medicare Annual (Subsequent) preventive examination.  Review of Systems    No ROS.  Medicare Wellness Virtual Visit.    Cardiac Risk Factors include: advanced age (>46men, >67 women);hypertension     Objective:    Today's Vitals   07/13/20 0942  Weight: 148 lb (67.1 kg)  Height: 5\' 4"  (1.626 m)   Body mass index is 25.4 kg/m.  Advanced Directives 07/13/2020 07/13/2019 07/09/2018 05/02/2018 04/17/2018 07/04/2017 06/22/2016  Does Patient Have a Medical Advance Directive? Yes Yes Yes Yes No;Yes Yes No  Type of 06/24/2016 of [place];Living will Healthcare Power of [place];Living will Healthcare Power of [place];Living will Healthcare Power of [place];Living will Healthcare Power of [place];Living will Healthcare Power of [place];Living will -  Does patient want to make changes to medical advance directive? No - Patient declined No - Patient declined No - Patient declined No - Patient declined No - Patient declined No - Patient declined Yes (MAU/Ambulatory/Procedural Areas - Information given)  Copy of Healthcare Power of Attorney in Chart? No - copy requested No - copy requested No - copy requested No - copy requested No - copy requested No - copy requested No - copy requested  Would patient like information on creating a medical advance directive? - - - No - Patient declined No - Patient declined - -    Current Medications (verified) Outpatient Encounter Medications as of 07/13/2020  Medication Sig  . amLODipine (NORVASC) 5 MG tablet TAKE 1 TABLET BY MOUTH DAILY  . aspirin EC 81 MG tablet Take 81 mg by mouth daily.  07/15/2020 atenolol (TENORMIN) 50 MG tablet TAKE 1 TABLET BY MOUTH DAILY  . cetirizine (ZYRTEC ALLERGY) 10 MG tablet Take 1 tablet (10 mg total) by mouth daily.  . fluticasone (FLONASE) 50 MCG/ACT nasal spray Place 2 sprays into both nostrils daily.  Marland Kitchen losartan-hydrochlorothiazide  (HYZAAR) 100-12.5 MG tablet TAKE ONE TABLET EVERY DAY  . potassium chloride (KLOR-CON) 10 MEQ tablet TAKE ONE TABLET EVERY DAY  . pravastatin (PRAVACHOL) 40 MG tablet TAKE 1 TABLET BY MOUTH DAILY  . rosuvastatin (CRESTOR) 20 MG tablet Take 1 tablet (20 mg total) by mouth daily.   No facility-administered encounter medications on file as of 07/13/2020.    Allergies (verified) Patient has no known allergies.   History: Past Medical History:  Diagnosis Date  . Allergy   . Hyperlipidemia   . Hypertension    Past Surgical History:  Procedure Laterality Date  . ABDOMINAL HYSTERECTOMY  1980   ovaries not removed, secondary to bleeding  . CHOLECYSTECTOMY  1995  . COLONOSCOPY    . TONSILLECTOMY AND ADENOIDECTOMY  1975   Family History  Problem Relation Age of Onset  . Hypertension Mother   . Hyperlipidemia Mother   . Heart disease Father   . Hypertension Father   . Colon cancer Neg Hx   . Colon polyps Neg Hx   . Rectal cancer Neg Hx   . Esophageal cancer Neg Hx   . Stomach cancer Neg Hx   . Breast cancer Neg Hx    Social History   Socioeconomic History  . Marital status: Widowed    Spouse name: Not on file  . Number of children: Not on file  . Years of education: Not on file  . Highest education level: Not on file  Occupational History  . Not on file  Tobacco Use  . Smoking status: Former  Smoker    Packs/day: 0.50    Years: 20.00    Pack years: 10.00    Types: Cigarettes  . Smokeless tobacco: Never Used  Vaping Use  . Vaping Use: Never used  Substance and Sexual Activity  . Alcohol use: Yes    Alcohol/week: 0.0 standard drinks    Comment: occasionally  . Drug use: No  . Sexual activity: Yes  Other Topics Concern  . Not on file  Social History Narrative  . Not on file   Social Determinants of Health   Financial Resource Strain: Low Risk   . Difficulty of Paying Living Expenses: Not hard at all  Food Insecurity: No Food Insecurity  . Worried About Community education officer in the Last Year: Never true  . Ran Out of Food in the Last Year: Never true  Transportation Needs: No Transportation Needs  . Lack of Transportation (Medical): No  . Lack of Transportation (Non-Medical): No  Physical Activity: Not on file  Stress: No Stress Concern Present  . Feeling of Stress : Not at all  Social Connections: Unknown  . Frequency of Communication with Friends and Family: More than three times a week  . Frequency of Social Gatherings with Friends and Family: More than three times a week  . Attends Religious Services: Not on file  . Active Member of Clubs or Organizations: Not on file  . Attends Banker Meetings: Not on file  . Marital Status: Widowed    Tobacco Counseling Counseling given: Not Answered   Clinical Intake:  Pre-visit preparation completed: Yes        Diabetes: No  How often do you need to have someone help you when you read instructions, pamphlets, or other written materials from your doctor or pharmacy?: 1 - Never    Interpreter Needed?: No      Activities of Daily Living In your present state of health, do you have any difficulty performing the following activities: 07/13/2020  Hearing? N  Vision? N  Difficulty concentrating or making decisions? N  Walking or climbing stairs? N  Dressing or bathing? N  Doing errands, shopping? N  Preparing Food and eating ? N  Using the Toilet? N  In the past six months, have you accidently leaked urine? N  Do you have problems with loss of bowel control? N  Managing your Medications? N  Managing your Finances? N  Housekeeping or managing your Housekeeping? N  Some recent data might be hidden    Patient Care Team: Dale Wailea, MD as PCP - General (Internal Medicine)  Indicate any recent Medical Services you may have received from other than [facility] providers in the past year (date may be approximate).     Assessment:   This is a routine wellness examination  for Cheyenne Baker.  I connected with Cheyenne Baker today by telephone and verified that I am speaking with the correct person using two identifiers. Location patient: home Location provider: work Persons participating in the virtual visit: patient, Engineer, civil (consulting).    I discussed the limitations, risks, security and privacy concerns of performing an evaluation and management service by telephone and the availability of in person appointments. The patient expressed understanding and verbally consented to this telephonic visit.    Interactive audio and video telecommunications were attempted between this provider and patient, however failed, due to patient having technical difficulties OR patient did not have access to video capability.  We continued and completed visit with audio only.  Some  vital signs may be absent or patient reported.   Hearing/Vision screen  Hearing Screening   125Hz  250Hz  500Hz  1000Hz  2000Hz  3000Hz  4000Hz  6000Hz  8000Hz   Right ear:           Left ear:           Comments: Patient is able to hear conversational tones without difficulty.  No issues reported.   Vision Screening Comments: Followed by Dr. Matilde Sprang  Wears corrective lenses  Annual visits  Visual acuity not assessed per patient preference since they have regular follow up with the ophthalmologist  Dietary issues and exercise activities discussed: Current Exercise Habits: Home exercise routine, Intensity: Mild  Regular diet Good water intake  Goals      Patient Stated   .  Increase physical activity (pt-stated)      Walk for exercise      Depression Screen PHQ 2/9 Scores 07/13/2020 07/13/2019 07/09/2018 07/04/2017 06/22/2016 10/04/2015 09/28/2014  PHQ - 2 Score 0 0 0 0 0 0 0  PHQ- 9 Score - - - 0 - - -    Fall Risk Fall Risk  07/13/2020 07/13/2019 07/09/2018 07/04/2017 06/22/2016  Falls in the past year? 0 0 0 No No  Number falls in past yr: 0 - - - -  Injury with Fall? 0 - - - -  Follow up Falls evaluation completed Falls  evaluation completed - - -    FALL RISK PREVENTION PERTAINING TO THE HOME: Handrails in use when climbing stairs? Yes Home free of loose throw rugs in walkways, pet beds, electrical cords, etc? Yes  Adequate lighting in your home to reduce risk of falls? Yes   ASSISTIVE DEVICES UTILIZED TO PREVENT FALLS: Use of a cane, walker or w/c? No    TIMED UP AND GO: Was the test performed? No . Virtual visit.   Cognitive Function: MMSE - Mini Mental State Exam 07/13/2019 07/09/2018 07/04/2017 06/22/2016  Not completed: Unable to complete - - -  Orientation to time - 5 5 5   Orientation to Place - 5 5 5   Registration - 3 3 3   Attention/ Calculation - 5 5 5   Recall - 3 3 3   Language- name 2 objects - 2 2 2   Language- repeat - 1 1 1   Language- follow 3 step command - 3 3 3   Language- read & follow direction - 1 1 1   Write a sentence - 1 1 1   Copy design - 1 1 1   Total score - 30 30 30      6CIT Screen 07/13/2020  What Year? 0 points  What month? 0 points  What time? 0 points  Months in reverse 0 points   Immunizations Immunization History  Administered Date(s) Administered  . Fluad Quad(high Dose 65+) 03/05/2019, 04/05/2020  . Influenza, High Dose Seasonal PF 06/22/2016, 07/04/2017, 05/20/2018, 03/05/2019  . Moderna Sars-Covid-2 Vaccination 07/15/2019, 08/12/2019, 05/07/2020  . Pneumococcal Conjugate-13 09/28/2014  . Pneumococcal Polysaccharide-23 10/04/2016  . Zoster 10/26/2013  . Zoster Recombinat (Shingrix) 03/11/2018, 06/26/2018   Health Maintenance Health Maintenance  Topic Date Due  . TETANUS/TDAP  01/23/2021  . MAMMOGRAM  05/12/2021  . COLONOSCOPY (Pts 45-73yrs Insurance coverage will need to be confirmed)  12/07/2024  . INFLUENZA VACCINE  Completed  . DEXA SCAN  Completed  . COVID-19 Vaccine  Completed  . Hepatitis C Screening  Completed  . PNA vac Low Risk Adult  Completed   Colorectal cancer screening: Type of screening: FOBT/FIT. Completed 04/13/20. Repeat every  1-3 years.  Mammogram status: Completed 05/12/20. Repeat every year.MM 3D SCREEN BREAST BILATERAL.  Bone density- 10/02/16. Normal. Repeat every 5 years.   Lung Cancer Screening: (Low Dose CT Chest recommended if Age 49-80 years, 30 pack-year currently smoking OR have quit w/in 15years.) does not qualify.   Hepatitis C Screening: Completed 07/04/17.  Vision Screening: Recommended annual ophthalmology exams for early detection of glaucoma and other disorders of the eye. Is the patient up to date with their annual eye exam?  Yes  Who is the provider or what is the name of the office in which the patient attends annual eye exams? Dr. Matilde Sprang.   Dental Screening: Recommended annual dental exams for proper oral hygiene.  Community Resource Referral / Chronic Care Management: CRR required this visit?  No   CCM required this visit?  No      Plan:   Keep all routine maintenance appointments.   I have personally reviewed and noted the following in the patient's chart:   . Medical and social history . Use of alcohol, tobacco or illicit drugs  . Current medications and supplements . Functional ability and status . Nutritional status . Physical activity . Advanced directives . List of other physicians . Hospitalizations, surgeries, and ER visits in previous 12 months . Vitals . Screenings to include cognitive, depression, and falls . Referrals and appointments  In addition, I have reviewed and discussed with patient certain preventive protocols, quality metrics, and [facility] practice recommendations. A written personalized care plan for preventive services as well as general preventive health recommendations were provided to patient via mychart.     Varney Biles, LPN   [id number]

## 2020-07-13 NOTE — Patient Instructions (Addendum)
Cheyenne Baker , Thank you for taking time to come for your Medicare Wellness Visit. I appreciate your ongoing commitment to your health goals. Please review the following plan we discussed and let me know if I can assist you in the future.   These are the goals we discussed: Goals      Patient Stated   .  Increase physical activity (pt-stated)      Walk for exercise       This is a list of the screening recommended for you and due dates:  Health Maintenance  Topic Date Due  . Tetanus Vaccine  01/23/2021  . Mammogram  05/12/2021  . Colon Cancer Screening  12/07/2024  . Flu Shot  Completed  . DEXA scan (bone density measurement)  Completed  . COVID-19 Vaccine  Completed  .  Hepatitis C: One time screening is recommended by Center for Disease Control  (CDC) for  adults born from [birth year] through 1967.   Completed  . Pneumonia vaccines  Completed    Immunizations Immunization History  Administered Date(s) Administered  . Fluad Quad(high Dose 65+) 03/05/2019, 04/05/2020  . Influenza, High Dose Seasonal PF 06/22/2016, 07/04/2017, 05/20/2018, 03/05/2019  . Moderna Sars-Covid-2 Vaccination 07/15/2019, 08/12/2019  . Pneumococcal Conjugate-13 09/28/2014  . Pneumococcal Polysaccharide-23 10/04/2016  . Zoster 10/26/2013  . Zoster Recombinat (Shingrix) 03/11/2018, 06/26/2018   Advanced directives: End of life planning; Advance aging; Advanced directives discussed.  Copy of current HCPOA/Living Will requested.    Conditions/risks identified: none new.  Follow up in one year for your annual wellness visit.   Preventive Care 17 Years and Older, Female Preventive care refers to lifestyle choices and visits with your health care provider that can promote health and wellness. What does preventive care include?  A yearly physical exam. This is also called an annual well check.  Dental exams once or twice a year.  Routine eye exams. Ask your health care provider how often you should have your  eyes checked.  Personal lifestyle choices, including:  Daily care of your teeth and gums.  Regular physical activity.  Eating a healthy diet.  Avoiding tobacco and drug use.  Limiting alcohol use.  Practicing safe sex.  Taking low-dose aspirin every day.  Taking vitamin and mineral supplements as recommended by your health care provider. What happens during an annual well check? The services and screenings done by your health care provider during your annual well check will depend on your age, overall health, lifestyle risk factors, and family history of disease. Counseling  Your health care provider may ask you questions about your:  Alcohol use.  Tobacco use.  Drug use.  Emotional well-being.  Home and relationship well-being.  Sexual activity.  Eating habits.  History of falls.  Memory and ability to understand (cognition).  Work and work Statistician.  Reproductive health. Screening  You may have the following tests or measurements:  Height, weight, and BMI.  Blood pressure.  Lipid and cholesterol levels. These may be checked every 5 years, or more frequently if you are over 59 years old.  Skin check.  Lung cancer screening. You may have this screening every year starting at age 38 if you have a 30-pack-year history of smoking and currently smoke or have quit within the past 15 years.  Fecal occult blood test (FOBT) of the stool. You may have this test every year starting at age 63.  Flexible sigmoidoscopy or colonoscopy. You may have a sigmoidoscopy every 5 years or a colonoscopy  every 10 years starting at age 77.  Hepatitis C blood test.  Hepatitis B blood test.  Sexually transmitted disease (STD) testing.  Diabetes screening. This is done by checking your blood sugar (glucose) after you have not eaten for a while (fasting). You may have this done every 1-3 years.  Bone density scan. This is done to screen for osteoporosis. You may have this  done starting at age 61.  Mammogram. This may be done every 1-2 years. Talk to your health care provider about how often you should have regular mammograms. Talk with your health care provider about your test results, treatment options, and if necessary, the need for more tests. Vaccines  Your health care provider may recommend certain vaccines, such as:  Influenza vaccine. This is recommended every year.  Tetanus, diphtheria, and acellular pertussis (Tdap, Td) vaccine. You may need a Td booster every 10 years.  Zoster vaccine. You may need this after age 18.  Pneumococcal 13-valent conjugate (PCV13) vaccine. One dose is recommended after age 67.  Pneumococcal polysaccharide (PPSV23) vaccine. One dose is recommended after age 68. Talk to your health care provider about which screenings and vaccines you need and how often you need them. This information is not intended to replace advice given to you by your health care provider. Make sure you discuss any questions you have with your health care provider. Document Released: 07/08/2015 Document Revised: 02/29/2016 Document Reviewed: 04/12/2015 Elsevier Interactive Patient Education  2017 [place] Prevention in the Home Falls can cause injuries. They can happen to people of all ages. There are many things you can do to make your home safe and to help prevent falls. What can I do on the outside of my home?  Regularly fix the edges of walkways and driveways and fix any cracks.  Remove anything that might make you trip as you walk through a door, such as a raised step or threshold.  Trim any bushes or trees on the path to your home.  Use bright outdoor lighting.  Clear any walking paths of anything that might make someone trip, such as rocks or tools.  Regularly check to see if handrails are loose or broken. Make sure that both sides of any steps have handrails.  Any raised decks and porches should have guardrails on the  edges.  Have any leaves, snow, or ice cleared regularly.  Use sand or salt on walking paths during winter.  Clean up any spills in your garage right away. This includes oil or grease spills. What can I do in the bathroom?  Use night lights.  Install grab bars by the toilet and in the tub and shower. Do not use towel bars as grab bars.  Use non-skid mats or decals in the tub or shower.  If you need to sit down in the shower, use a plastic, non-slip stool.  Keep the floor dry. Clean up any water that spills on the floor as soon as it happens.  Remove soap buildup in the tub or shower regularly.  Attach bath mats securely with double-sided non-slip rug tape.  Do not have throw rugs and other things on the floor that can make you trip. What can I do in the bedroom?  Use night lights.  Make sure that you have a light by your bed that is easy to reach.  Do not use any sheets or blankets that are too big for your bed. They should not hang down onto the floor.  Have a firm chair that has side arms. You can use this for support while you get dressed.  Do not have throw rugs and other things on the floor that can make you trip. What can I do in the kitchen?  Clean up any spills right away.  Avoid walking on wet floors.  Keep items that you use a lot in easy-to-reach places.  If you need to reach something above you, use a strong step stool that has a grab bar.  Keep electrical cords out of the way.  Do not use floor polish or wax that makes floors slippery. If you must use wax, use non-skid floor wax.  Do not have throw rugs and other things on the floor that can make you trip. What can I do with my stairs?  Do not leave any items on the stairs.  Make sure that there are handrails on both sides of the stairs and use them. Fix handrails that are broken or loose. Make sure that handrails are as long as the stairways.  Check any carpeting to make sure that it is firmly  attached to the stairs. Fix any carpet that is loose or worn.  Avoid having throw rugs at the top or bottom of the stairs. If you do have throw rugs, attach them to the floor with carpet tape.  Make sure that you have a light switch at the top of the stairs and the bottom of the stairs. If you do not have them, ask someone to add them for you. What else can I do to help prevent falls?  Wear shoes that:  Do not have high heels.  Have rubber bottoms.  Are comfortable and fit you well.  Are closed at the toe. Do not wear sandals.  If you use a stepladder:  Make sure that it is fully opened. Do not climb a closed stepladder.  Make sure that both sides of the stepladder are locked into place.  Ask someone to hold it for you, if possible.  Clearly mark and make sure that you can see:  Any grab bars or handrails.  First and last steps.  Where the edge of each step is.  Use tools that help you move around (mobility aids) if they are needed. These include:  Canes.  Walkers.  Scooters.  Crutches.  Turn on the lights when you go into a dark area. Replace any light bulbs as soon as they burn out.  Set up your furniture so you have a clear path. Avoid moving your furniture around.  If any of your floors are uneven, fix them.  If there are any pets around you, be aware of where they are.  Review your medicines with your doctor. Some medicines can make you feel dizzy. This can increase your chance of falling. Ask your doctor what other things that you can do to help prevent falls. This information is not intended to replace advice given to you by your health care provider. Make sure you discuss any questions you have with your health care provider. Document Released: 04/07/2009 Document Revised: 11/17/2015 Document Reviewed: 07/16/2014 Elsevier Interactive Patient Education  2017 [employer].

## 2020-09-14 ENCOUNTER — Other Ambulatory Visit: Payer: Self-pay | Admitting: Internal Medicine

## 2020-11-10 ENCOUNTER — Other Ambulatory Visit: Payer: Self-pay | Admitting: Internal Medicine

## 2021-01-16 DIAGNOSIS — Z23 Encounter for immunization: Secondary | ICD-10-CM | POA: Diagnosis not present

## 2021-02-17 ENCOUNTER — Other Ambulatory Visit: Payer: Self-pay | Admitting: Internal Medicine

## 2021-03-14 ENCOUNTER — Other Ambulatory Visit: Payer: Self-pay | Admitting: Internal Medicine

## 2021-04-21 DIAGNOSIS — Z23 Encounter for immunization: Secondary | ICD-10-CM | POA: Diagnosis not present

## 2021-05-09 DIAGNOSIS — Z23 Encounter for immunization: Secondary | ICD-10-CM | POA: Diagnosis not present

## 2021-06-08 ENCOUNTER — Other Ambulatory Visit: Payer: Self-pay | Admitting: Internal Medicine

## 2021-06-08 DIAGNOSIS — Z1231 Encounter for screening mammogram for malignant neoplasm of breast: Secondary | ICD-10-CM

## 2021-06-13 ENCOUNTER — Telehealth: Payer: Self-pay | Admitting: Internal Medicine

## 2021-06-13 NOTE — Telephone Encounter (Signed)
I have not seen her in over one year.  She has not had labs in over one year.  Ok to refill x 1 month - needs a lab appt and needs a f/u appt with me.

## 2021-06-13 NOTE — Telephone Encounter (Signed)
Previously prescribed by Dr Rockey Situ. Takes potassium 10 meq once everyday. Are you okay with me refilling for her?

## 2021-06-13 NOTE — Telephone Encounter (Signed)
Pt called in stating that her medication (potassium chloride (KLOR-CON) 10 MEQ tablet) was sent to the wrong pharmacy. Pt is requesting for a new script to be sent to [facility] in [place]. Pt is requesting callback stating the script have been sent over.

## 2021-06-14 ENCOUNTER — Other Ambulatory Visit: Payer: Self-pay

## 2021-06-14 MED ORDER — POTASSIUM CHLORIDE ER 10 MEQ PO TBCR: 10.0000 meq | EXTENDED_RELEASE_TABLET | Freq: Every day | ORAL | 0 refills | Status: DC

## 2021-06-14 NOTE — Telephone Encounter (Signed)
Medication sent in. Patient is aware. Appt scheduled

## 2021-06-23 ENCOUNTER — Other Ambulatory Visit: Payer: Self-pay

## 2021-06-23 MED ORDER — POTASSIUM CHLORIDE ER 10 MEQ PO TBCR: 10.0000 meq | EXTENDED_RELEASE_TABLET | Freq: Every day | ORAL | 0 refills | Status: DC

## 2021-06-23 NOTE — Telephone Encounter (Signed)
Resent to [facility]. Pt is aware.

## 2021-06-23 NOTE — Telephone Encounter (Signed)
Pt called in stating that medication was sent to the wrong pharmacy. Pt said it should have went to [facility] instead

## 2021-07-11 ENCOUNTER — Ambulatory Visit: Payer: Medicare Other | Admitting: Internal Medicine

## 2021-07-14 ENCOUNTER — Ambulatory Visit (INDEPENDENT_AMBULATORY_CARE_PROVIDER_SITE_OTHER): Payer: Medicare Other

## 2021-07-14 VITALS — Ht 64.0 in | Wt 148.0 lb

## 2021-07-14 DIAGNOSIS — Z0000 Encounter for general adult medical examination without abnormal findings: Secondary | ICD-10-CM | POA: Diagnosis not present

## 2021-07-14 NOTE — Progress Notes (Signed)
Subjective:   Cheyenne Baker is a 71 y.o. female who presents for Medicare Annual (Subsequent) preventive examination.  Review of Systems    No ROS.  Medicare Wellness Virtual Visit.  Visual/audio telehealth visit, UTA vital signs.   See social history for additional risk factors.   Cardiac Risk Factors include: advanced age (>48men, >84 women)     Objective:    Today's Vitals   07/14/21 0949  Weight: 148 lb (67.1 kg)  Height: 5\' 4"  (1.626 m)   Body mass index is 25.4 kg/m.  Advanced Directives 07/13/2020 07/13/2019 07/09/2018 05/02/2018 04/17/2018 07/04/2017 06/22/2016  Does Patient Have a Medical Advance Directive? Yes Yes Yes Yes No;Yes Yes No  Type of Paramedic of [place];Living will [place];Living will [place];Living will [place];Living will [place];Living will [place];Living will -  Does patient want to make changes to medical advance directive? No - Patient declined No - Patient declined No - Patient declined No - Patient declined No - Patient declined No - Patient declined Yes (MAU/Ambulatory/Procedural Areas - Information given)  Copy of [place] in Chart? No - copy requested No - copy requested No - copy requested No - copy requested No - copy requested No - copy requested No - copy requested  Would patient like information on creating a medical advance directive? - - - No - Patient declined No - Patient declined - -    Current Medications (verified) Outpatient Encounter Medications as of 07/14/2021  Medication Sig   amLODipine (NORVASC) 5 MG tablet TAKE 1 TABLET BY MOUTH DAILY   aspirin EC 81 MG tablet Take 81 mg by mouth daily.   atenolol (TENORMIN) 50 MG tablet TAKE 1 TABLET BY MOUTH DAILY   cetirizine (ZYRTEC ALLERGY) 10 MG tablet Take 1 tablet (10 mg total) by mouth daily.   fluticasone (FLONASE) 50 MCG/ACT nasal spray  Place 2 sprays into both nostrils daily.   losartan-hydrochlorothiazide (HYZAAR) 100-12.5 MG tablet TAKE ONE TABLET EVERY DAY   potassium chloride (KLOR-CON) 10 MEQ tablet Take 1 tablet (10 mEq total) by mouth daily.   pravastatin (PRAVACHOL) 40 MG tablet TAKE 1 TABLET BY MOUTH DAILY   rosuvastatin (CRESTOR) 20 MG tablet TAKE 1 TABLET BY MOUTH DAILY   No facility-administered encounter medications on file as of 07/14/2021.    Allergies (verified) Patient has no known allergies.   History: Past Medical History:  Diagnosis Date   Allergy    Hyperlipidemia    Hypertension    Past Surgical History:  Procedure Laterality Date   ABDOMINAL HYSTERECTOMY  1980   ovaries not removed, secondary to bleeding   CHOLECYSTECTOMY  1995   COLONOSCOPY     TONSILLECTOMY AND ADENOIDECTOMY  1975   Family History  Problem Relation Age of Onset   Hypertension Mother    Hyperlipidemia Mother    Heart disease Father    Hypertension Father    Breast cancer Daughter    Colon cancer Neg Hx    Colon polyps Neg Hx    Rectal cancer Neg Hx    Esophageal cancer Neg Hx    Stomach cancer Neg Hx    Social History   Socioeconomic History   Marital status: Widowed    Spouse name: Not on file   Number of children: Not on file   Years of education: Not on file   Highest education level: Not on file  Occupational History   Not on file  Tobacco Use   Smoking status: Former    Packs/day: 0.50    Years: 20.00    Pack years: 10.00    Types: Cigarettes   Smokeless tobacco: Never  Vaping Use   Vaping Use: Never used  Substance and Sexual Activity   Alcohol use: Yes    Alcohol/week: 0.0 standard drinks    Comment: occasionally   Drug use: No   Sexual activity: Yes  Other Topics Concern   Not on file  Social History Narrative   Not on file   Social Determinants of Health   Financial Resource Strain: Low Risk    Difficulty of Paying Living Expenses: Not hard at all  Food Insecurity: No Food  Insecurity   Worried About Charity fundraiser in the Last Year: Never true   [place] in the Last Year: Never true  Transportation Needs: No Transportation Needs   Lack of Transportation (Medical): No   Lack of Transportation (Non-Medical): No  Physical Activity: Not on file  Stress: No Stress Concern Present   Feeling of Stress : Not at all  Social Connections: Unknown   Frequency of Communication with Friends and Family: More than three times a week   Frequency of Social Gatherings with Friends and Family: More than three times a week   Attends Religious Services: Not on file   Active Member of Clubs or Organizations: Not on file   Attends Archivist Meetings: Not on file   Marital Status: Widowed    Tobacco Counseling Counseling given: Not Answered   Clinical Intake:  Pre-visit preparation completed: Yes        Diabetes: No  How often do you need to have someone help you when you read instructions, pamphlets, or other written materials from your doctor or pharmacy?: 1 - Never    Interpreter Needed?: No      Activities of Daily Living In your present state of health, do you have any difficulty performing the following activities: 07/14/2021  Hearing? N  Vision? N  Difficulty concentrating or making decisions? N  Walking or climbing stairs? N  Dressing or bathing? N  Doing errands, shopping? N  Preparing Food and eating ? N  Using the Toilet? N  In the past six months, have you accidently leaked urine? N  Do you have problems with loss of bowel control? N  Managing your Medications? N  Managing your Finances? N  Housekeeping or managing your Housekeeping? N  Some recent data might be hidden    Patient Care Team: Einar Pheasant, MD as PCP - General (Internal Medicine)  Indicate any recent Medical Services you may have received from other than [facility] providers in the past year (date may be approximate).     Assessment:   This is a  routine wellness examination for Cheyenne Baker.  Virtual Visit via Telephone Note  I connected with  Cheyenne Baker on 07/14/21 at  9:45 AM EST by telephone and verified that I am speaking with the correct person using two identifiers.  Persons participating in the virtual visit: patient/Nurse Health Advisor   I discussed the limitations, risks, security and privacy concerns of performing an evaluation and management service by telephone and the availability of in person appointments. The patient expressed understanding and agreed to proceed.  Interactive audio and video telecommunications were attempted between this nurse and patient, however failed, due to patient having technical difficulties OR patient  did not have access to video capability.  We continued and completed visit with audio only.  Some vital signs may be absent or patient reported.   Hearing/Vision screen Hearing Screening - Comments:: Patient is able to hear conversational tones without difficulty. No issues reported.  Vision Screening - Comments:: Followed by Dr. Matilde Sprang  Wears corrective lenses Annual visits They have regular follow up with the ophthalmologist  Dietary issues and exercise activities discussed: Current Exercise Habits: Home exercise routine, Type of exercise: walking, Time (Minutes): 20, Frequency (Times/Week): 3, Weekly Exercise (Minutes/Week): 60, Intensity: Moderate Healthy diet Good water intake   Goals Addressed               This Visit's Progress     Patient Stated     Maintain Healthy Lifestyle (pt-stated)        Walk for exercise Stay hydrated        Depression Screen PHQ 2/9 Scores 07/14/2021 07/13/2020 07/13/2019 07/09/2018 07/04/2017 06/22/2016 10/04/2015  PHQ - 2 Score 0 0 0 0 0 0 0  PHQ- 9 Score - - - - 0 - -    Fall Risk Fall Risk  07/14/2021 07/13/2020 07/13/2019 07/09/2018 07/04/2017  Falls in the past year? 0 0 0 0 No  Number falls in past yr: 0 0 - - -  Injury with Fall? 0 0 - - -   Follow up Falls evaluation completed Falls evaluation completed Falls evaluation completed - -   FALL RISK PREVENTION PERTAINING TO THE HOME: Home free of loose throw rugs in walkways, pet beds, electrical cords, etc? Yes  Adequate lighting in your home to reduce risk of falls? Yes   ASSISTIVE DEVICES UTILIZED TO PREVENT FALLS: Life alert? No  Use of a cane, walker or w/c? No   TIMED UP AND GO: Was the test performed? No .   Cognitive Function: MMSE - Mini Mental State Exam 07/13/2019 07/09/2018 07/04/2017 06/22/2016  Not completed: Unable to complete - - -  Orientation to time - 5 5 5   Orientation to Place - 5 5 5   Registration - 3 3 3   Attention/ Calculation - 5 5 5   Recall - 3 3 3   Language- name 2 objects - 2 2 2   Language- repeat - 1 1 1   Language- follow 3 step command - 3 3 3   Language- read & follow direction - 1 1 1   Write a sentence - 1 1 1   Copy design - 1 1 1   Total score - 30 30 30      6CIT Screen 07/13/2020  What Year? 0 points  What month? 0 points  What time? 0 points  Months in reverse 0 points    Immunizations Immunization History  Administered Date(s) Administered   Fluad Quad(high Dose 65+) 03/05/2019, 04/05/2020, 04/25/2021   Influenza, High Dose Seasonal PF 06/22/2016, 07/04/2017, 05/20/2018, 03/05/2019   Moderna Sars-Covid-2 Vaccination 07/15/2019, 08/12/2019, 05/07/2020, 01/16/2021   Pfizer Covid-19 Vaccine Bivalent Booster 5y-11y 05/19/2021   Pneumococcal Conjugate-13 09/28/2014   Pneumococcal Polysaccharide-23 10/04/2016   Zoster Recombinat (Shingrix) 03/11/2018, 06/26/2018   Zoster, Live 10/26/2013   TDAP status: Due, Education has been provided regarding the importance of this vaccine. Advised may receive this vaccine at local pharmacy or Health Dept. Aware to provide a copy of the vaccination record if obtained from local pharmacy or Health Dept. Verbalized acceptance and understanding. Deferred.   Screening Tests Health Maintenance   Topic Date Due   MAMMOGRAM  05/12/2021   TETANUS/TDAP  07/14/2022 (Originally 01/23/2021)   COLONOSCOPY (Pts 45-19yrs Insurance coverage will need to be confirmed)  12/07/2024   Pneumonia Vaccine 8+ Years old  Completed   INFLUENZA VACCINE  Completed   DEXA SCAN  Completed   COVID-19 Vaccine  Completed   Hepatitis C Screening  Completed   Zoster Vaccines- Shingrix  Completed   HPV VACCINES  Aged Out   Health Maintenance Health Maintenance Due  Topic Date Due   MAMMOGRAM  05/12/2021   Lung Cancer Screening: (Low Dose CT Chest recommended if Age 99-80 years, 30 pack-year currently smoking OR have quit w/in 15years.) does not qualify.   Vision Screening: Recommended annual ophthalmology exams for early detection of glaucoma and other disorders of the eye.  Dental Screening: Recommended annual dental exams for proper oral hygiene  Community Resource Referral / Chronic Care Management: CRR required this visit?  No   CCM required this visit?  No      Plan:   Keep all routine maintenance appointments.   I have personally reviewed and noted the following in the patients chart:   Medical and social history Use of alcohol, tobacco or illicit drugs  Current medications and supplements including opioid prescriptions. Not taking opioid.  Functional ability and status Nutritional status Physical activity Advanced directives List of other physicians Hospitalizations, surgeries, and ER visits in previous 12 months Vitals Screenings to include cognitive, depression, and falls Referrals and appointments  In addition, I have reviewed and discussed with patient certain preventive protocols, quality metrics, and [place] practice recommendations. A written personalized care plan for preventive services as well as general preventive health recommendations were provided to patient.     Varney Biles, LPN   [id number]

## 2021-07-14 NOTE — Patient Instructions (Addendum)
°  Cheyenne Baker , Thank you for taking time to come for your Medicare Wellness Visit. I appreciate your ongoing commitment to your health goals. Please review the following plan we discussed and let me know if I can assist you in the future.   These are the goals we discussed:  Goals       Patient Stated     Maintain Healthy Lifestyle (pt-stated)      Walk for exercise Stay hydrated         This is a list of the screening recommended for you and due dates:  Health Maintenance  Topic Date Due   Mammogram  05/12/2021   Tetanus Vaccine  07/14/2022*   Colon Cancer Screening  12/07/2024   Pneumonia Vaccine  Completed   Flu Shot  Completed   DEXA scan (bone density measurement)  Completed   COVID-19 Vaccine  Completed   Hepatitis C Screening: USPSTF Recommendation to screen - Ages 2-79 yo.  Completed   Zoster (Shingles) Vaccine  Completed   HPV Vaccine  Aged Out  *Topic was postponed. The date shown is not the original due date.

## 2021-07-19 ENCOUNTER — Other Ambulatory Visit: Payer: Self-pay | Admitting: Internal Medicine

## 2021-07-20 NOTE — Telephone Encounter (Signed)
Spoke with pt confirmed that she does not need this prior to appt on 1/31. Has enough to last

## 2021-07-20 NOTE — Telephone Encounter (Signed)
Ok to refill x 1 if has been taking regularly.  Scheduled for appt.  Overdue labs - needs fasting labs.

## 2021-07-25 ENCOUNTER — Other Ambulatory Visit: Payer: Self-pay

## 2021-07-25 ENCOUNTER — Encounter: Payer: Self-pay | Admitting: Internal Medicine

## 2021-07-25 ENCOUNTER — Ambulatory Visit (INDEPENDENT_AMBULATORY_CARE_PROVIDER_SITE_OTHER): Payer: Medicare Other | Admitting: Internal Medicine

## 2021-07-25 DIAGNOSIS — R0989 Other specified symptoms and signs involving the circulatory and respiratory systems: Secondary | ICD-10-CM

## 2021-07-25 DIAGNOSIS — E049 Nontoxic goiter, unspecified: Secondary | ICD-10-CM | POA: Diagnosis not present

## 2021-07-25 DIAGNOSIS — I10 Essential (primary) hypertension: Secondary | ICD-10-CM | POA: Diagnosis not present

## 2021-07-25 DIAGNOSIS — Z0000 Encounter for general adult medical examination without abnormal findings: Secondary | ICD-10-CM

## 2021-07-25 DIAGNOSIS — E7800 Pure hypercholesterolemia, unspecified: Secondary | ICD-10-CM | POA: Diagnosis not present

## 2021-07-25 DIAGNOSIS — I479 Paroxysmal tachycardia, unspecified: Secondary | ICD-10-CM

## 2021-07-25 DIAGNOSIS — Z1211 Encounter for screening for malignant neoplasm of colon: Secondary | ICD-10-CM

## 2021-07-25 DIAGNOSIS — R739 Hyperglycemia, unspecified: Secondary | ICD-10-CM | POA: Diagnosis not present

## 2021-07-25 DIAGNOSIS — F439 Reaction to severe stress, unspecified: Secondary | ICD-10-CM | POA: Diagnosis not present

## 2021-07-25 LAB — LIPID PANEL
Cholesterol: 170 mg/dL (ref 0–200)
HDL: 62.3 mg/dL (ref >39.00)
LDL Cholesterol: 86 mg/dL (ref 0–99)
NonHDL: 107.32
Total CHOL/HDL Ratio: 3
Triglycerides: 109 mg/dL (ref 0.0–149.0)
VLDL: 21.8 mg/dL (ref 0.0–40.0)

## 2021-07-25 LAB — CBC WITH DIFFERENTIAL/PLATELET
Basophils Absolute: 0 10*3/uL (ref 0.0–0.1)
Basophils Relative: 0.4 % (ref 0.0–3.0)
Eosinophils Absolute: 0.1 10*3/uL (ref 0.0–0.7)
Eosinophils Relative: 1 % (ref 0.0–5.0)
HCT: 42.7 % (ref 36.0–46.0)
Hemoglobin: 14.2 g/dL (ref 12.0–15.0)
Lymphocytes Relative: 15.4 % (ref 12.0–46.0)
Lymphs Abs: 1.6 10*3/uL (ref 0.7–4.0)
MCHC: 33.4 g/dL (ref 30.0–36.0)
MCV: 92.3 fl (ref 78.0–100.0)
Monocytes Absolute: 0.6 10*3/uL (ref 0.1–1.0)
Monocytes Relative: 5.5 % (ref 3.0–12.0)
Neutro Abs: 8.2 10*3/uL — ABNORMAL HIGH (ref 1.4–7.7)
Neutrophils Relative %: 77.7 % — ABNORMAL HIGH (ref 43.0–77.0)
Platelets: 296 10*3/uL (ref 150.0–400.0)
RBC: 4.62 Mil/uL (ref 3.87–5.11)
RDW: 13.4 % (ref 11.5–15.5)
WBC: 10.6 10*3/uL — ABNORMAL HIGH (ref 4.0–10.5)

## 2021-07-25 LAB — HEPATIC FUNCTION PANEL
ALT: 20 U/L (ref 0–35)
AST: 22 U/L (ref 0–37)
Albumin: 4.7 g/dL (ref 3.5–5.2)
Alkaline Phosphatase: 55 U/L (ref 39–117)
Bilirubin, Direct: 0.2 mg/dL (ref 0.0–0.3)
Total Bilirubin: 0.8 mg/dL (ref 0.2–1.2)
Total Protein: 7.8 g/dL (ref 6.0–8.3)

## 2021-07-25 LAB — BASIC METABOLIC PANEL
BUN: 17 mg/dL (ref 6–23)
CO2: 31 mEq/L (ref 19–32)
Calcium: 10.6 mg/dL — ABNORMAL HIGH (ref 8.4–10.5)
Chloride: 101 mEq/L (ref 96–112)
Creatinine, Ser: 1.02 mg/dL (ref 0.40–1.20)
GFR: 54.8 mL/min — ABNORMAL LOW (ref >60.00)
Glucose, Bld: 95 mg/dL (ref 70–99)
Potassium: 4.1 mEq/L (ref 3.5–5.1)
Sodium: 141 mEq/L (ref 135–145)

## 2021-07-25 LAB — T4, FREE: Free T4: 0.57 ng/dL — ABNORMAL LOW (ref 0.60–1.60)

## 2021-07-25 LAB — TSH: TSH: 1.05 u[IU]/mL (ref 0.35–5.50)

## 2021-07-25 LAB — HEMOGLOBIN A1C: Hgb A1c MFr Bld: 5.7 % (ref 4.6–6.5)

## 2021-07-25 NOTE — Progress Notes (Signed)
Patient ID: Cheyenne Baker, female   DOB: [birth year], 71 y.o.   MRN: [id number]   Subjective:    Patient ID: Cheyenne Baker, female    DOB: [birth year], 71 y.o.   MRN: [id number]  This visit occurred during the SARS-CoV-2 public health emergency.  Safety protocols were in place, including screening questions prior to the visit, additional usage of staff PPE, and extensive cleaning of exam room while observing appropriate contact time as indicated for disinfecting solutions.   Marland Kitchen   HPI With past history of hypertension, tachycardia and hypercholesterolemia.  She comes in today to follow up on these issues as well as for a complete physical exam.  Have not seen her since 03/2020.  She reports she is doing relatively well.  Increased stress.  Mother had a stroke - left her blind.  She is taking care of her.  Increased stress related to this.  Overall she feels she is handling things relatively well.  Does not feel needs any further intervention at this time.  No chest pain or sob reported.  No abdominal pain or bowel change reported.     Past Medical History:  Diagnosis Date   Allergy    Hyperlipidemia    Hypertension    Past Surgical History:  Procedure Laterality Date   ABDOMINAL HYSTERECTOMY  1980   ovaries not removed, secondary to bleeding   CHOLECYSTECTOMY  1995   COLONOSCOPY     TONSILLECTOMY AND ADENOIDECTOMY  1975   Family History  Problem Relation Age of Onset   Hypertension Mother    Hyperlipidemia Mother    Heart disease Father    Hypertension Father    Breast cancer Daughter    Colon cancer Neg Hx    Colon polyps Neg Hx    Rectal cancer Neg Hx    Esophageal cancer Neg Hx    Stomach cancer Neg Hx    Social History   Socioeconomic History   Marital status: Widowed    Spouse name: Not on file   Number of children: Not on file   Years of education: Not on file   Highest education level: Not on file  Occupational History   Not on file  Tobacco Use   Smoking status:  Former    Packs/day: 0.50    Years: 20.00    Pack years: 10.00    Types: Cigarettes   Smokeless tobacco: Never  Vaping Use   Vaping Use: Never used  Substance and Sexual Activity   Alcohol use: Yes    Alcohol/week: 0.0 standard drinks    Comment: occasionally   Drug use: No   Sexual activity: Yes  Other Topics Concern   Not on file  Social History Narrative   Not on file   Social Determinants of Health   Financial Resource Strain: Low Risk    Difficulty of Paying Living Expenses: Not hard at all  Food Insecurity: No Food Insecurity   Worried About Charity fundraiser in the Last Year: Never true   [place] in the Last Year: Never true  Transportation Needs: No Transportation Needs   Lack of Transportation (Medical): No   Lack of Transportation (Non-Medical): No  Physical Activity: Not on file  Stress: No Stress Concern Present   Feeling of Stress : Not at all  Social Connections: Unknown   Frequency of Communication with Friends and Family: More than three times a week   Frequency of Social Gatherings with Friends and Family:  More than three times a week   Attends Religious Services: Not on file   Active Member of Clubs or Organizations: Not on file   Attends Club or Organization Meetings: Not on file   Marital Status: Widowed     Review of Systems  Constitutional:  Negative for appetite change and unexpected weight change.  HENT:  Negative for congestion, sinus pressure and sore throat.   Eyes:  Negative for pain and visual disturbance.  Respiratory:  Negative for cough, chest tightness and shortness of breath.   Cardiovascular:  Negative for chest pain, palpitations and leg swelling.  Gastrointestinal:  Negative for abdominal pain, diarrhea, nausea and vomiting.  Genitourinary:  Negative for difficulty urinating and dysuria.  Musculoskeletal:  Negative for joint swelling and myalgias.  Skin:  Negative for color change and rash.  Neurological:  Negative for  dizziness, light-headedness and headaches.  Hematological:  Negative for adenopathy. Does not bruise/bleed easily.  Psychiatric/Behavioral:  Negative for agitation and dysphoric mood.       Objective:     BP (!) 152/78    Pulse 67    Temp 97.6 F (36.4 C)    Ht $R'5\' 4"'Vb$  (1.626 m)    Wt 149 lb 6.4 oz (67.8 kg)    SpO2 99%    BMI 25.64 kg/m  Wt Readings from Last 3 Encounters:  07/25/21 149 lb 6.4 oz (67.8 kg)  07/14/21 148 lb (67.1 kg)  07/13/20 148 lb (67.1 kg)    Physical Exam Vitals reviewed.  Constitutional:      General: She is not in acute distress.    Appearance: Normal appearance. She is well-developed.  HENT:     Head: Normocephalic and atraumatic.     Right Ear: External ear normal.     Left Ear: External ear normal.  Eyes:     General: No scleral icterus.       Right eye: No discharge.        Left eye: No discharge.     Conjunctiva/sclera: Conjunctivae normal.  Neck:     Thyroid: No thyromegaly.  Cardiovascular:     Rate and Rhythm: Normal rate and regular rhythm.  Pulmonary:     Effort: No tachypnea, accessory muscle usage or respiratory distress.     Breath sounds: Normal breath sounds. No decreased breath sounds or wheezing.  Chest:  Breasts:    Right: No inverted nipple, mass, nipple discharge or tenderness (no axillary adenopathy).     Left: No inverted nipple, mass, nipple discharge or tenderness (no axilarry adenopathy).  Abdominal:     General: Bowel sounds are normal.     Palpations: Abdomen is soft.     Tenderness: There is no abdominal tenderness.  Musculoskeletal:        General: No swelling or tenderness.     Cervical back: Neck supple.  Lymphadenopathy:     Cervical: No cervical adenopathy.  Skin:    Findings: No erythema or rash.  Neurological:     Mental Status: She is alert and oriented to person, place, and time.  Psychiatric:        Mood and Affect: Mood normal.        Behavior: Behavior normal.     Outpatient Encounter  Medications as of 07/25/2021  Medication Sig   amLODipine (NORVASC) 5 MG tablet TAKE 1 TABLET BY MOUTH DAILY   aspirin EC 81 MG tablet Take 81 mg by mouth daily.   atenolol (TENORMIN) 50 MG tablet TAKE 1 TABLET  BY MOUTH DAILY   cetirizine (ZYRTEC ALLERGY) 10 MG tablet Take 1 tablet (10 mg total) by mouth daily.   losartan-hydrochlorothiazide (HYZAAR) 100-12.5 MG tablet TAKE ONE TABLET EVERY DAY   potassium chloride (KLOR-CON) 10 MEQ tablet Take 1 tablet (10 mEq total) by mouth daily.   pravastatin (PRAVACHOL) 40 MG tablet TAKE 1 TABLET BY MOUTH DAILY   rosuvastatin (CRESTOR) 20 MG tablet TAKE 1 TABLET BY MOUTH DAILY   [DISCONTINUED] fluticasone (FLONASE) 50 MCG/ACT nasal spray Place 2 sprays into both nostrils daily. (Patient not taking: Reported on 07/25/2021)   No facility-administered encounter medications on file as of 07/25/2021.     Lab Results  Component Value Date   WBC 10.6 (H) 07/25/2021   HGB 14.2 07/25/2021   HCT 42.7 07/25/2021   PLT 296.0 07/25/2021   GLUCOSE 95 07/25/2021   CHOL 170 07/25/2021   TRIG 109.0 07/25/2021   HDL 62.30 07/25/2021   LDLDIRECT 143.3 06/11/2013   LDLCALC 86 07/25/2021   ALT 20 07/25/2021   AST 22 07/25/2021   NA 141 07/25/2021   K 4.1 07/25/2021   CL 101 07/25/2021   CREATININE 1.02 07/25/2021   BUN 17 07/25/2021   CO2 31 07/25/2021   TSH 1.05 07/25/2021   HGBA1C 5.7 07/25/2021    MM 3D SCREEN BREAST BILATERAL  Result Date: 05/16/2020 CLINICAL DATA:  Screening. EXAM: DIGITAL SCREENING BILATERAL MAMMOGRAM WITH TOMO AND CAD COMPARISON:  Previous exam(s). ACR Breast Density Category c: The breast tissue is heterogeneously dense, which may obscure small masses. FINDINGS: There are no findings suspicious for malignancy. Images were processed with CAD. IMPRESSION: No mammographic evidence of malignancy. A result letter of this screening mammogram will be mailed directly to the patient. RECOMMENDATION: Screening mammogram in one year.  (Code:SM-B-01Y) BI-RADS CATEGORY  1: Negative. Electronically Signed   By: Emmaline Kluver M.D.   On: 05/16/2020 13:55       Assessment & Plan:   Problem List Items Addressed This Visit     Enlarged thyroid    Has been followed by ENT and Dr Tedd Sias.  Biopsy - benign colloid nodule.  Check tsh and free T4.  Appears to have increased fullness - on exam.  Discussed f/u ultrasound.       Relevant Orders   TSH (Completed)   T4, free (Completed)   US THYROID   Essential hypertension    Blood pressure slightly elevated today.  On losartan/hctz and atenolol.  Have her spot check her pressure.  Get her back in soon to reassess.  Hold on adjusting medication - until can review trend of pressures.        Relevant Orders   CBC w/Diff (Completed)   Basic Metabolic Panel (BMET) (Completed)   Health care maintenance    Physical today 07/25/21.  Mammogram scheduled for 08/03/21.  Colonoscopy 11/2014.  Recommended f/u in 10 years (Dr Rhea Belton).  Hemoccult cards given.        Hypercholesterolemia    On crestor now.  (changed from pravastatin).  Low cholesterol diet and exercise.  Follow lipid panel and liver function tests.        Relevant Orders   Lipid panel (Completed)   Hepatic function panel (Completed)   Hyperglycemia    Low carb diet and exercise.  Follow met b and a1c.       Relevant Orders   HgB A1c (Completed)   Paroxysmal tachycardia ([facility])    On atenolol.  No increased heart rate or palpitations.  Follow.        Right carotid bruit    Carotid ultrasound - ok.        Stress    Increased stress.  Discussed with her today.  Overall she feels she is doing relatively well.  Does not feel she needs any further intervention at this time.  Follow.        Other Visit Diagnoses     Colon cancer screening       Relevant Orders   Fecal occult blood, imunochemical        Einar Pheasant, MD

## 2021-07-25 NOTE — Assessment & Plan Note (Signed)
Blood pressure slightly elevated today.  On losartan/hctz and atenolol.  Have her spot check her pressure.  Get her back in soon to reassess.  Hold on adjusting medication - until can review trend of pressures.

## 2021-07-25 NOTE — Assessment & Plan Note (Signed)
Carotid ultrasound ok.  

## 2021-07-25 NOTE — Assessment & Plan Note (Signed)
Increased stress.  Discussed with her today.  Overall she feels she is doing relatively well.  Does not feel she needs any further intervention at this time.  Follow.

## 2021-07-25 NOTE — Assessment & Plan Note (Signed)
Has been followed by ENT and Dr Gabriel Carina.  Biopsy - benign colloid nodule.  Check tsh and free T4.  Appears to have increased fullness - on exam.  Discussed f/u ultrasound.

## 2021-07-25 NOTE — Assessment & Plan Note (Signed)
On crestor now.  (changed from pravastatin).  Low cholesterol diet and exercise.  Follow lipid panel and liver function tests.

## 2021-07-25 NOTE — Assessment & Plan Note (Signed)
On atenolol.  No increased heart rate or palpitations.  Follow.   

## 2021-07-25 NOTE — Assessment & Plan Note (Signed)
Low carb diet and exercise.  Follow met b and a1c.

## 2021-07-25 NOTE — Assessment & Plan Note (Signed)
Physical today 07/25/21.  Mammogram scheduled for 08/03/21.  Colonoscopy 11/2014.  Recommended f/u in 10 years (Dr Hilarie Fredrickson).  Hemoccult cards given.

## 2021-07-26 ENCOUNTER — Telehealth: Payer: Self-pay | Admitting: Internal Medicine

## 2021-07-26 NOTE — Telephone Encounter (Signed)
Lft pt vm to call ofc to sch US thyroid. thanks 

## 2021-07-27 ENCOUNTER — Other Ambulatory Visit: Payer: Self-pay

## 2021-07-27 DIAGNOSIS — R944 Abnormal results of kidney function studies: Secondary | ICD-10-CM

## 2021-07-31 ENCOUNTER — Other Ambulatory Visit: Payer: Self-pay | Admitting: Internal Medicine

## 2021-08-01 ENCOUNTER — Other Ambulatory Visit: Payer: Self-pay

## 2021-08-01 ENCOUNTER — Ambulatory Visit
Admission: RE | Admit: 2021-08-01 | Discharge: 2021-08-01 | Disposition: A | Discharge status: Home / Self Care | Payer: Medicare Other | Source: Ambulatory Visit | Attending: Internal Medicine | Admitting: Internal Medicine

## 2021-08-01 DIAGNOSIS — E049 Nontoxic goiter, unspecified: Secondary | ICD-10-CM | POA: Diagnosis not present

## 2021-08-01 DIAGNOSIS — E042 Nontoxic multinodular goiter: Secondary | ICD-10-CM | POA: Diagnosis not present

## 2021-08-02 ENCOUNTER — Other Ambulatory Visit (INDEPENDENT_AMBULATORY_CARE_PROVIDER_SITE_OTHER): Payer: Medicare Other

## 2021-08-02 DIAGNOSIS — Z1211 Encounter for screening for malignant neoplasm of colon: Secondary | ICD-10-CM

## 2021-08-03 ENCOUNTER — Other Ambulatory Visit: Payer: Self-pay

## 2021-08-03 ENCOUNTER — Telehealth: Payer: Self-pay | Admitting: Internal Medicine

## 2021-08-03 ENCOUNTER — Ambulatory Visit
Admission: RE | Admit: 2021-08-03 | Discharge: 2021-08-03 | Disposition: A | Discharge status: Home / Self Care | Payer: Medicare Other | Source: Ambulatory Visit | Attending: Internal Medicine | Admitting: Internal Medicine

## 2021-08-03 DIAGNOSIS — Z1231 Encounter for screening mammogram for malignant neoplasm of breast: Secondary | ICD-10-CM | POA: Insufficient documentation

## 2021-08-03 LAB — FECAL OCCULT BLOOD, IMMUNOCHEMICAL: Fecal Occult Bld: POSITIVE — AB

## 2021-08-03 NOTE — Telephone Encounter (Signed)
CRITICAL VALUE STICKER  CRITICAL VALUE: Positive I FOB  RECEIVER (on-site recipient of call): Sharee Pimple  DATE & TIME NOTIFIED: 08/03/2021 7:45am  MESSENGER (representative from lab): Lorie  MD NOTIFIED: Dr. Nicki Reaper  TIME OF NOTIFICATION: 8:20am  RESPONSE:

## 2021-08-04 ENCOUNTER — Encounter: Payer: Self-pay | Admitting: Internal Medicine

## 2021-08-04 ENCOUNTER — Other Ambulatory Visit: Payer: Self-pay

## 2021-08-04 DIAGNOSIS — R195 Other fecal abnormalities: Secondary | ICD-10-CM

## 2021-08-15 ENCOUNTER — Other Ambulatory Visit: Payer: Self-pay

## 2021-08-15 ENCOUNTER — Other Ambulatory Visit (INDEPENDENT_AMBULATORY_CARE_PROVIDER_SITE_OTHER): Payer: Medicare Other

## 2021-08-15 DIAGNOSIS — R944 Abnormal results of kidney function studies: Secondary | ICD-10-CM | POA: Diagnosis not present

## 2021-08-15 LAB — BASIC METABOLIC PANEL
BUN: 15 mg/dL (ref 6–23)
CO2: 30 mEq/L (ref 19–32)
Calcium: 9.8 mg/dL (ref 8.4–10.5)
Chloride: 103 mEq/L (ref 96–112)
Creatinine, Ser: 1.02 mg/dL (ref 0.40–1.20)
GFR: 54.77 mL/min — ABNORMAL LOW (ref >60.00)
Glucose, Bld: 97 mg/dL (ref 70–99)
Potassium: 3.8 mEq/L (ref 3.5–5.1)
Sodium: 140 mEq/L (ref 135–145)

## 2021-08-18 ENCOUNTER — Encounter: Payer: Self-pay | Admitting: Internal Medicine

## 2021-08-18 NOTE — Telephone Encounter (Signed)
Called and explained to patient that Dr Gabriel Carina is supposed to be reviewing her biopsy from 2014 and then recent ultrasound before agreeing to see her. They are supposed to contact patient or our office to set up appt.

## 2021-08-31 ENCOUNTER — Other Ambulatory Visit: Payer: Self-pay

## 2021-08-31 ENCOUNTER — Telehealth: Payer: Self-pay | Admitting: Internal Medicine

## 2021-08-31 MED ORDER — POTASSIUM CHLORIDE ER 10 MEQ PO TBCR: 10.0000 meq | EXTENDED_RELEASE_TABLET | Freq: Every day | ORAL | 1 refills | Status: DC

## 2021-08-31 NOTE — Telephone Encounter (Signed)
Pt need refill on potassium sent to [facility] for 90 days. Pt is on her last one today ?

## 2021-09-01 ENCOUNTER — Encounter: Payer: Self-pay | Admitting: *Deleted

## 2021-09-05 ENCOUNTER — Other Ambulatory Visit: Payer: Self-pay

## 2021-09-05 ENCOUNTER — Ambulatory Visit (INDEPENDENT_AMBULATORY_CARE_PROVIDER_SITE_OTHER): Payer: Medicare Other | Admitting: Internal Medicine

## 2021-09-05 DIAGNOSIS — I10 Essential (primary) hypertension: Secondary | ICD-10-CM | POA: Diagnosis not present

## 2021-09-05 DIAGNOSIS — E049 Nontoxic goiter, unspecified: Secondary | ICD-10-CM

## 2021-09-05 DIAGNOSIS — E7800 Pure hypercholesterolemia, unspecified: Secondary | ICD-10-CM | POA: Diagnosis not present

## 2021-09-05 DIAGNOSIS — R739 Hyperglycemia, unspecified: Secondary | ICD-10-CM | POA: Diagnosis not present

## 2021-09-05 DIAGNOSIS — I479 Paroxysmal tachycardia, unspecified: Secondary | ICD-10-CM

## 2021-09-05 DIAGNOSIS — F439 Reaction to severe stress, unspecified: Secondary | ICD-10-CM | POA: Diagnosis not present

## 2021-09-05 MED ORDER — AMLODIPINE BESYLATE 10 MG PO TABS: 10.0000 mg | ORAL_TABLET | Freq: Every day | ORAL | 1 refills | Status: DC

## 2021-09-05 NOTE — Patient Instructions (Signed)
Increase norvasc (amlodipine) to '10mg'$  per day.  ?

## 2021-09-05 NOTE — Progress Notes (Signed)
Patient ID: Cheyenne Baker, female   DOB: [birth year], 71 y.o.   MRN: [id number] ? ? ?Subjective:  ? ? Patient ID: Cheyenne Baker, female    DOB: [birth year], 71 y.o.   MRN: [id number] ? ?This visit occurred during the SARS-CoV-2 public health emergency.  Safety protocols were in place, including screening questions prior to the visit, additional usage of staff PPE, and extensive cleaning of exam room while observing appropriate contact time as indicated for disinfecting solutions.  ? ?Patient here for a scheduled follow up.  ? ?Chief Complaint  ?Patient presents with  ? Hypertension  ? .  ? ?HPI ?Blood pressure elevated last visit.  On losartan/hctz, amlodipine and atenolol.  Blood pressure remaining elevated.  Has started exercising more.  Increased stress related to her mother's health issues.  Discussed.  Does not feel needs any further intervention at this time.  No chest pain or sob reported.  No abdominal pain or bowel change reported.  ? ? ?Past Medical History:  ?Diagnosis Date  ? Allergy   ? Diverticulosis   ? Enlarged thyroid   ? Hyperlipidemia   ? Hypertension   ? Paroxysmal tachycardia ([place])   ? Right carotid bruit   ? ?Past Surgical History:  ?Procedure Laterality Date  ? ABDOMINAL HYSTERECTOMY  1980  ? ovaries not removed, secondary to bleeding  ? CHOLECYSTECTOMY  1995  ? COLONOSCOPY    ? TONSILLECTOMY AND ADENOIDECTOMY  1975  ? ?Family History  ?Problem Relation Age of Onset  ? Hypertension Mother   ? Hyperlipidemia Mother   ? Heart disease Father   ? Hypertension Father   ? Breast cancer Daughter 26  ? Colon cancer Neg Hx   ? Colon polyps Neg Hx   ? Rectal cancer Neg Hx   ? Esophageal cancer Neg Hx   ? Stomach cancer Neg Hx   ? ?Social History  ? ?Socioeconomic History  ? Marital status: Widowed  ?  Spouse name: Not on file  ? Number of children: Not on file  ? Years of education: Not on file  ? Highest education level: Not on file  ?Occupational History  ? Not on file  ?Tobacco Use  ? Smoking status:  Former  ?  Packs/day: 0.50  ?  Years: 20.00  ?  Pack years: 10.00  ?  Types: Cigarettes  ? Smokeless tobacco: Never  ?Vaping Use  ? Vaping Use: Never used  ?Substance and Sexual Activity  ? Alcohol use: Yes  ?  Alcohol/week: 0.0 standard drinks  ?  Comment: occasionally  ? Drug use: No  ? Sexual activity: Yes  ?Other Topics Concern  ? Not on file  ?Social History Narrative  ? Not on file  ? ?Social Determinants of Health  ? ?Financial Resource Strain: Low Risk   ? Difficulty of Paying Living Expenses: Not hard at all  ?Food Insecurity: No Food Insecurity  ? Worried About Charity fundraiser in the Last Year: Never true  ? Ran Out of Food in the Last Year: Never true  ?Transportation Needs: No Transportation Needs  ? Lack of Transportation (Medical): No  ? Lack of Transportation (Non-Medical): No  ?Physical Activity: Not on file  ?Stress: No Stress Concern Present  ? Feeling of Stress : Not at all  ?Social Connections: Unknown  ? Frequency of Communication with Friends and Family: More than three times a week  ? Frequency of Social Gatherings with Friends and Family: More than three times a  week  ? Attends Religious Services: Not on file  ? Active Member of Clubs or Organizations: Not on file  ? Attends Archivist Meetings: Not on file  ? Marital Status: Widowed  ? ? ? ?Review of Systems  ?Constitutional:  Negative for appetite change and unexpected weight change.  ?HENT:  Negative for congestion and sinus pressure.   ?Respiratory:  Negative for cough, chest tightness and shortness of breath.   ?Cardiovascular:  Negative for chest pain, palpitations and leg swelling.  ?Gastrointestinal:  Negative for abdominal pain, diarrhea, nausea and vomiting.  ?Genitourinary:  Negative for difficulty urinating and dysuria.  ?Musculoskeletal:  Negative for joint swelling and myalgias.  ?Skin:  Negative for color change and rash.  ?Neurological:  Negative for dizziness, light-headedness and headaches.   ?Psychiatric/Behavioral:  Negative for agitation and dysphoric mood.   ? ?   ?Objective:  ?  ? ?BP (!) 148/88   Pulse 83   Temp 97.9 ?F (36.6 ?C)   Resp 16   Ht '5\' 4"'$  (1.626 m)   Wt 151 lb 9.6 oz (68.8 kg)   SpO2 98%   BMI 26.02 kg/m?  ?Wt Readings from Last 3 Encounters:  ?09/05/21 151 lb 9.6 oz (68.8 kg)  ?07/25/21 149 lb 6.4 oz (67.8 kg)  ?07/14/21 148 lb (67.1 kg)  ? ? ?Physical Exam ?Vitals reviewed.  ?Constitutional:   ?   General: She is not in acute distress. ?   Appearance: Normal appearance.  ?HENT:  ?   Head: Normocephalic and atraumatic.  ?   Right Ear: External ear normal.  ?   Left Ear: External ear normal.  ?Eyes:  ?   General: No scleral icterus.    ?   Right eye: No discharge.     ?   Left eye: No discharge.  ?   Conjunctiva/sclera: Conjunctivae normal.  ?Neck:  ?   Thyroid: No thyromegaly.  ?Cardiovascular:  ?   Rate and Rhythm: Normal rate and regular rhythm.  ?Pulmonary:  ?   Effort: No respiratory distress.  ?   Breath sounds: Normal breath sounds. No wheezing.  ?Abdominal:  ?   General: Bowel sounds are normal.  ?   Palpations: Abdomen is soft.  ?   Tenderness: There is no abdominal tenderness.  ?Musculoskeletal:     ?   General: No swelling or tenderness.  ?   Cervical back: Neck supple. No tenderness.  ?Lymphadenopathy:  ?   Cervical: No cervical adenopathy.  ?Skin: ?   Findings: No erythema or rash.  ?Neurological:  ?   Mental Status: She is alert.  ?Psychiatric:     ?   Mood and Affect: Mood normal.     ?   Behavior: Behavior normal.  ? ? ? ?Outpatient Encounter Medications as of 09/05/2021  ?Medication Sig  ? amLODipine (NORVASC) 10 MG tablet Take 1 tablet (10 mg total) by mouth daily.  ? aspirin EC 81 MG tablet Take 81 mg by mouth daily.  ? atenolol (TENORMIN) 50 MG tablet TAKE 1 TABLET BY MOUTH DAILY  ? cetirizine (ZYRTEC ALLERGY) 10 MG tablet Take 1 tablet (10 mg total) by mouth daily.  ? losartan-hydrochlorothiazide (HYZAAR) 100-12.5 MG tablet TAKE ONE TABLET EVERY DAY  ?  potassium chloride (KLOR-CON) 10 MEQ tablet Take 1 tablet (10 mEq total) by mouth daily.  ? rosuvastatin (CRESTOR) 20 MG tablet TAKE 1 TABLET BY MOUTH DAILY  ? [DISCONTINUED] amLODipine (NORVASC) 5 MG tablet TAKE 1 TABLET BY MOUTH  DAILY  ? ?No facility-administered encounter medications on file as of 09/05/2021.  ?  ? ?Lab Results  ?Component Value Date  ? WBC 10.6 (H) 07/25/2021  ? HGB 14.2 07/25/2021  ? HCT 42.7 07/25/2021  ? PLT 296.0 07/25/2021  ? GLUCOSE 97 08/15/2021  ? CHOL 170 07/25/2021  ? TRIG 109.0 07/25/2021  ? HDL 62.30 07/25/2021  ? LDLDIRECT 143.3 06/11/2013  ? [place] 86 07/25/2021  ? ALT 20 07/25/2021  ? AST 22 07/25/2021  ? NA 140 08/15/2021  ? K 3.8 08/15/2021  ? CL 103 08/15/2021  ? CREATININE 1.02 08/15/2021  ? BUN 15 08/15/2021  ? CO2 30 08/15/2021  ? TSH 1.05 07/25/2021  ? HGBA1C 5.7 07/25/2021  ? ? ?MM 3D SCREEN BREAST BILATERAL ? ?Result Date: 08/03/2021 ?CLINICAL DATA:  Screening. EXAM: DIGITAL SCREENING BILATERAL MAMMOGRAM WITH TOMOSYNTHESIS AND CAD TECHNIQUE: Bilateral screening digital craniocaudal and mediolateral oblique mammograms were obtained. Bilateral screening digital breast tomosynthesis was performed. The images were evaluated with computer-aided detection. COMPARISON:  Previous exam(s). ACR Breast Density Category c: The breast tissue is heterogeneously dense, which may obscure small masses. FINDINGS: There are no findings suspicious for malignancy. IMPRESSION: No mammographic evidence of malignancy. A result letter of this screening mammogram will be mailed directly to the patient. RECOMMENDATION: Screening mammogram in one year. (Code:SM-B-01Y) BI-RADS CATEGORY  1: Negative. Electronically Signed   By: Audie Pinto M.D.   On: 08/03/2021 13:47 ? ? ?   ?Assessment & Plan:  ? ?Problem List Items Addressed This Visit   ? ? Enlarged thyroid  ?  Has been followed by ENT and Dr Gabriel Carina.  Biopsy - benign colloid nodule.   Appears to have increased fullness - on exam.  Discussed f/u  ultrasound. Will f/u with endocrinology - has appt 09/19/21.  ?  ?  ? Essential hypertension  ?  Blood pressure slightly elevated today.  On losartan/hctz and atenolol. Currently taking amlodipine '5mg'$  q day.  Increase to '10mg'$  q day.  Fol

## 2021-09-10 ENCOUNTER — Encounter: Payer: Self-pay | Admitting: Internal Medicine

## 2021-09-10 NOTE — Assessment & Plan Note (Signed)
Blood pressure slightly elevated today.  On losartan/hctz and atenolol. Currently taking amlodipine '5mg'$  q day.  Increase to '10mg'$  q day.  Follow pressures.  Follow metabolic panel.  Send in readings.  ?

## 2021-09-10 NOTE — Assessment & Plan Note (Signed)
Increased stress.  Discussed. Overall she feels she is doing relatively well.  Does not feel she needs any further intervention at this time.  Follow.   ?

## 2021-09-10 NOTE — Assessment & Plan Note (Signed)
Has been followed by ENT and Dr Gabriel Carina.  Biopsy - benign colloid nodule.   Appears to have increased fullness - on exam.  Discussed f/u ultrasound. Will f/u with endocrinology - has appt 09/19/21.  ?

## 2021-09-10 NOTE — Assessment & Plan Note (Signed)
Low carb diet and exercise.  Follow met b and a1c.  ?

## 2021-09-10 NOTE — Assessment & Plan Note (Signed)
On crestor.  Low cholesterol diet and exercise.  Follow lipid panel and liver function tests.   

## 2021-09-10 NOTE — Assessment & Plan Note (Signed)
On atenolol.  No increased heart rate or palpitations.  Follow.   

## 2021-09-11 ENCOUNTER — Other Ambulatory Visit: Payer: Self-pay | Admitting: Internal Medicine

## 2021-09-11 ENCOUNTER — Telehealth: Payer: Self-pay | Admitting: Internal Medicine

## 2021-09-11 NOTE — Telephone Encounter (Signed)
Patient called and is requesting a refill on her losartan-hydrochlorothiazide (HYZAAR) 100-12.5 MG tablet. [facility] in [place]. ?

## 2021-09-12 ENCOUNTER — Other Ambulatory Visit: Payer: Self-pay

## 2021-09-12 ENCOUNTER — Ambulatory Visit (INDEPENDENT_AMBULATORY_CARE_PROVIDER_SITE_OTHER): Payer: Medicare Other | Admitting: Internal Medicine

## 2021-09-12 ENCOUNTER — Encounter: Payer: Self-pay | Admitting: Internal Medicine

## 2021-09-12 VITALS — BP 154/70 | HR 72 | Ht 64.0 in | Wt 150.0 lb

## 2021-09-12 DIAGNOSIS — R195 Other fecal abnormalities: Secondary | ICD-10-CM

## 2021-09-12 MED ORDER — LOSARTAN POTASSIUM-HCTZ 100-12.5 MG PO TABS: 1.0000 | ORAL_TABLET | Freq: Every day | ORAL | 1 refills | Status: DC

## 2021-09-12 MED ORDER — PLENVU 140 G PO SOLR: 1.0000 | ORAL | 0 refills | Status: DC

## 2021-09-12 NOTE — Progress Notes (Signed)
Patient ID: Cheyenne Baker, female   DOB: 1950-10-16, 71 y.o.   MRN: [id number] ?HPI: ?Cheyenne Baker is a 71 year old female with a history of colonic diverticulosis, hypertension, hyperlipidemia who is seen to evaluate a positive FIT test.  She is here alone today. ? ?She is known to me from her last screening colonoscopy which I performed in 2016 June.  This was normal to the terminal ileum with good prep.  There was mild sigmoid diverticulosis.  10-year recall was recommended. ? ?She reports no GI complaints today.  With her annual follow-up will Dr. Nicki Reaper ordered and FIT test which was positive.  No change in bowel habit.  No abdominal pain.  No diarrhea or constipation.  No upper GI or hepatobiliary complaint.  She feels very well.  No chest pain or dyspnea. ? ?Past Medical History:  ?Diagnosis Date  ? Allergy   ? Diverticulosis   ? Enlarged thyroid   ? Hyperlipidemia   ? Hypertension   ? Paroxysmal tachycardia ([place])   ? Right carotid bruit   ? ? ?Past Surgical History:  ?Procedure Laterality Date  ? ABDOMINAL HYSTERECTOMY  1980  ? ovaries not removed, secondary to bleeding  ? CHOLECYSTECTOMY  1995  ? COLONOSCOPY    ? TONSILLECTOMY AND ADENOIDECTOMY  1975  ? ? ?Outpatient Medications Prior to Visit  ?Medication Sig Dispense Refill  ? amLODipine (NORVASC) 10 MG tablet Take 1 tablet (10 mg total) by mouth daily. 90 tablet 1  ? aspirin EC 81 MG tablet Take 81 mg by mouth daily.    ? atenolol (TENORMIN) 50 MG tablet TAKE 1 TABLET BY MOUTH DAILY 90 tablet 1  ? cetirizine (ZYRTEC ALLERGY) 10 MG tablet Take 1 tablet (10 mg total) by mouth daily. 90 tablet 1  ? losartan-hydrochlorothiazide (HYZAAR) 100-12.5 MG tablet Take 1 tablet by mouth daily. 90 tablet 1  ? potassium chloride (KLOR-CON) 10 MEQ tablet Take 1 tablet (10 mEq total) by mouth daily. 90 tablet 1  ? rosuvastatin (CRESTOR) 20 MG tablet TAKE 1 TABLET BY MOUTH DAILY 90 tablet 2  ? losartan-hydrochlorothiazide (HYZAAR) 100-12.5 MG tablet TAKE ONE TABLET BY MOUTH  DAILY 90 tablet 1  ? ?No facility-administered medications prior to visit.  ? ? ?No Known Allergies ? ?Family History  ?Problem Relation Age of Onset  ? Hypertension Mother   ? Hyperlipidemia Mother   ? Heart disease Father   ? Hypertension Father   ? Breast cancer Daughter 61  ? Colon cancer Neg Hx   ? Colon polyps Neg Hx   ? Rectal cancer Neg Hx   ? Esophageal cancer Neg Hx   ? Stomach cancer Neg Hx   ? ? ?Social History  ? ?Tobacco Use  ? Smoking status: Former  ?  Packs/day: 0.50  ?  Years: 20.00  ?  Pack years: 10.00  ?  Types: Cigarettes  ? Smokeless tobacco: Never  ?Vaping Use  ? Vaping Use: Never used  ?Substance Use Topics  ? Alcohol use: Yes  ?  Alcohol/week: 0.0 standard drinks  ?  Comment: occasionally  ? Drug use: No  ? ? ?ROS: ?As per history of present illness, otherwise negative ? ?BP (!) 154/70   Pulse 72   Ht '5\' 4"'$  (1.626 m)   Wt 150 lb (68 kg)   SpO2 97%   BMI 25.75 kg/m?  ?Gen: awake, alert, NAD ?HEENT: anicteric  ?CV: RRR, no mrg ?Pulm: CTA b/l ?Abd: soft, NT/ND, +BS throughout ?Ext: no c/c/e ?Neuro:  nonfocal ? ? ?RELEVANT LABS AND IMAGING: ?CBC ?   ?Component Value Date/Time  ? WBC 10.6 (H) 07/25/2021 1208  ? RBC 4.62 07/25/2021 1208  ? HGB 14.2 07/25/2021 1208  ? HCT 42.7 07/25/2021 1208  ? PLT 296.0 07/25/2021 1208  ? MCV 92.3 07/25/2021 1208  ? MCH 31.3 05/02/2018 1122  ? MCHC 33.4 07/25/2021 1208  ? RDW 13.4 07/25/2021 1208  ? LYMPHSABS 1.6 07/25/2021 1208  ? MONOABS 0.6 07/25/2021 1208  ? EOSABS 0.1 07/25/2021 1208  ? BASOSABS 0.0 07/25/2021 1208  ? ? ?CMP  ?   ?Component Value Date/Time  ? NA 140 08/15/2021 1052  ? K 3.8 08/15/2021 1052  ? CL 103 08/15/2021 1052  ? CO2 30 08/15/2021 1052  ? GLUCOSE 97 08/15/2021 1052  ? BUN 15 08/15/2021 1052  ? CREATININE 1.02 08/15/2021 1052  ? CREATININE 0.93 12/23/2019 0951  ? CALCIUM 9.8 08/15/2021 1052  ? PROT 7.8 07/25/2021 1208  ? ALBUMIN 4.7 07/25/2021 1208  ? AST 22 07/25/2021 1208  ? ALT 20 07/25/2021 1208  ? ALKPHOS 55 07/25/2021 1208  ?  BILITOT 0.8 07/25/2021 1208  ? GFRNONAA 60 (L) 05/02/2018 1122  ? GFRAA >60 05/02/2018 1122  ? ?FIT -- positive ? ?ASSESSMENT/PLAN: ?71 year old female with a history of colonic diverticulosis, hypertension, hyperlipidemia who is seen to evaluate a positive FIT test.  ? ?+ FIT --repeat colonoscopy recommended.  She had a normal colonoscopy 7 years ago which is reassuring.  We reviewed the risk, benefits and alternatives to colonoscopy and she is agreeable and wishes to proceed ?--Colonoscopy in the [facility] ? ? ? ? ? ?MP:[facility], [place] ?[address] ?[address] ?[place],  [place] [address] ? ? ?

## 2021-09-12 NOTE — Telephone Encounter (Signed)
Medication refilled

## 2021-09-12 NOTE — Patient Instructions (Signed)
You have been scheduled for a colonoscopy. Please follow written instructions given to you at your visit today.  ?Please pick up your prep supplies at the pharmacy within the next 1-3 days. ?If you use inhalers (even only as needed), please bring them with you on the day of your procedure. ? ?If you are age 73 or older, your body mass index should be between 23-30. Your Body mass index is 25.75 kg/m?Marland Kitchen If this is out of the aforementioned range listed, please consider follow up with your Primary Care Provider. ? ?If you are age 36 or younger, your body mass index should be between 19-25. Your Body mass index is 25.75 kg/m?Marland Kitchen If this is out of the aformentioned range listed, please consider follow up with your Primary Care Provider.  ? ?________________________________________________________ ? ?The [facility] providers would like to encourage you to use [facility] to communicate with providers for non-urgent requests or questions.  Due to long hold times on the telephone, sending your provider a message by [facility] may be a faster and more efficient way to get a response.  Please allow 48 business hours for a response.  Please remember that this is for non-urgent requests.  ?_______________________________________________________ ? ?Due to recent changes in healthcare laws, you may see the results of your imaging and laboratory studies on MyChart before your provider has had a chance to review them.  We understand that in some cases there may be results that are confusing or concerning to you. Not all laboratory results come back in the same time frame and the provider may be waiting for multiple results in order to interpret others.  Please give [place] 48 hours in order for your provider to thoroughly review all the results before contacting the office for clarification of your results.  ? ?

## 2021-09-19 DIAGNOSIS — E041 Nontoxic single thyroid nodule: Secondary | ICD-10-CM | POA: Diagnosis not present

## 2021-09-21 ENCOUNTER — Ambulatory Visit (AMBULATORY_SURGERY_CENTER): Payer: Medicare Other | Admitting: Internal Medicine

## 2021-09-21 ENCOUNTER — Encounter: Payer: Self-pay | Admitting: Internal Medicine

## 2021-09-21 VITALS — BP 92/41 | HR 79 | Temp 97.1°F | Resp 15 | Ht 64.0 in | Wt 150.0 lb

## 2021-09-21 DIAGNOSIS — R195 Other fecal abnormalities: Secondary | ICD-10-CM | POA: Diagnosis not present

## 2021-09-21 DIAGNOSIS — D122 Benign neoplasm of ascending colon: Secondary | ICD-10-CM | POA: Diagnosis not present

## 2021-09-21 DIAGNOSIS — Z1211 Encounter for screening for malignant neoplasm of colon: Secondary | ICD-10-CM

## 2021-09-21 MED ORDER — SODIUM CHLORIDE 0.9 % IV SOLN: 500.0000 mL | INTRAVENOUS | Status: DC

## 2021-09-21 NOTE — Progress Notes (Signed)
See office note dated 09/12/2021 for details ?She remains appropriate for upper endoscopy in the [facility] today to evaluate positive FIT ? ?

## 2021-09-21 NOTE — Progress Notes (Signed)
Called to room to assist during endoscopic procedure.  Patient ID and intended procedure confirmed with present staff. Received instructions for my participation in the procedure from the performing physician.  

## 2021-09-21 NOTE — Patient Instructions (Signed)
Resume previous diet and current medications.  Await pathology results.  No recommendation at this time regarding repeat colonoscopy due to age at next screening/surveillance interval.  ? ?YOU HAD AN ENDOSCOPIC PROCEDURE TODAY AT [facility]:   Refer to the procedure report that was given to you for any specific questions about what was found during the examination.  If the procedure report does not answer your questions, please call your gastroenterologist to clarify.  If you requested that your care partner not be given the details of your procedure findings, then the procedure report has been included in a sealed envelope for you to review at your convenience later. ? ?YOU SHOULD EXPECT: Some feelings of bloating in the abdomen. Passage of more gas than usual.  Walking can help get rid of the air that was put into your GI tract during the procedure and reduce the bloating. If you had a lower endoscopy (such as a colonoscopy or flexible sigmoidoscopy) you may notice spotting of blood in your stool or on the toilet paper. If you underwent a bowel prep for your procedure, you may not have a normal bowel movement for a few days. ? ?Please Note:  You might notice some irritation and congestion in your nose or some drainage.  This is from the oxygen used during your procedure.  There is no need for concern and it should clear up in a day or so. ? ?SYMPTOMS TO REPORT IMMEDIATELY: ? ?Following lower endoscopy (colonoscopy or flexible sigmoidoscopy): ? Excessive amounts of blood in the stool ? Significant tenderness or worsening of abdominal pains ? Swelling of the abdomen that is new, acute ? Fever of 100?F or higher ? ? ?For urgent or emergent issues, a gastroenterologist can be reached at any hour by calling [phone]. ?Do not use MyChart messaging for urgent concerns.  ? ? ?DIET:  We do recommend a small meal at first, but then you may proceed to your regular diet.  Drink plenty of fluids but you  should avoid alcoholic beverages for 24 hours. ? ?ACTIVITY:  You should plan to take it easy for the rest of today and you should NOT DRIVE or use heavy machinery until tomorrow (because of the sedation medicines used during the test).   ? ?FOLLOW UP: ?Our staff will call the number listed on your records 48-72 hours following your procedure to check on you and address any questions or concerns that you may have regarding the information given to you following your procedure. If we do not reach you, we will leave a message.  We will attempt to reach you two times.  During this call, we will ask if you have developed any symptoms of COVID 19. If you develop any symptoms (ie: fever, flu-like symptoms, shortness of breath, cough etc.) before then, please call [phone].  If you test positive for Covid 19 in the 2 weeks post procedure, please call and report this information to [place].   ? ?If any biopsies were taken you will be contacted by phone or by letter within the next 1-3 weeks.  Please call us at [phone] if you have not heard about the biopsies in 3 weeks.  ? ? ?SIGNATURES/CONFIDENTIALITY: ?You and/or your care partner have signed paperwork which will be entered into your electronic medical record.  These signatures attest to the fact that that the information above on your After Visit Summary has been reviewed and is understood.  Full responsibility of the confidentiality of  this discharge information lies with you and/or your care-partner.  ?

## 2021-09-21 NOTE — Progress Notes (Signed)
PT taken to PACU. Monitors in place. VSS. Report given to RN. 

## 2021-09-21 NOTE — Op Note (Signed)
[facility] ?Patient Name: Cheyenne Baker ?Procedure Date: 09/21/2021 8:33 AM ?MRN: [id number] ?Endoscopist: Jerene Bears , MD ?Age: 71 ?Referring MD:  ?Date of Birth: 1951-05-17 ?Gender: Female ?Account #: [id number] ?Procedure:                Colonoscopy ?Indications:              Positive fecal immunochemical test, last  ?                          colonoscopy 2016 ?Medicines:                Monitored Anesthesia Care ?Procedure:                Pre-Anesthesia Assessment: ?                          - Prior to the procedure, a History and Physical  ?                          was performed, and patient medications and  ?                          allergies were reviewed. The patient's tolerance of  ?                          previous anesthesia was also reviewed. The risks  ?                          and benefits of the procedure and the sedation  ?                          options and risks were discussed with the patient.  ?                          All questions were answered, and informed consent  ?                          was obtained. Prior Anticoagulants: The patient has  ?                          taken no previous anticoagulant or antiplatelet  ?                          agents. ASA Grade Assessment: II - A patient with  ?                          mild systemic disease. After reviewing the risks  ?                          and benefits, the patient was deemed in  ?                          satisfactory condition to undergo the procedure. ?  After obtaining informed consent, the colonoscope  ?                          was passed under direct vision. Throughout the  ?                          procedure, the patient's blood pressure, pulse, and  ?                          oxygen saturations were monitored continuously. The  ?                          Olympus PCF-H190DL ([id number]) Colonoscope was  ?                          introduced through the anus and advanced to the  ?                           cecum, identified by appendiceal orifice and  ?                          ileocecal valve. The colonoscopy was performed  ?                          without difficulty. The patient tolerated the  ?                          procedure well. The quality of the bowel  ?                          preparation was good. The ileocecal valve,  ?                          appendiceal orifice, and rectum were photographed. ?Scope In: 8:45:59 AM ?Scope Out: 9:00:49 AM ?Scope Withdrawal Time: 0 hours 11 minutes 36 seconds  ?Total Procedure Duration: 0 hours 14 minutes 50 seconds  ?Findings:                 The digital rectal exam was normal. ?                          A 6 mm polyp was found in the ascending colon. The  ?                          polyp was sessile. The polyp was removed with a  ?                          cold snare. Resection and retrieval were complete. ?                          Multiple small-mouthed diverticula were found in  ?                          the sigmoid colon and descending colon. ?  Internal hemorrhoids were found during  ?                          retroflexion. The hemorrhoids were small. ?Complications:            No immediate complications. ?Estimated Blood Loss:     Estimated blood loss was minimal. ?Impression:               - One 6 mm polyp in the ascending colon, removed  ?                          with a cold snare. Resected and retrieved. ?                          - Diverticulosis in the sigmoid colon and in the  ?                          descending colon. ?                          - Internal hemorrhoids. ?Recommendation:           - Patient has a contact number available for  ?                          emergencies. The signs and symptoms of potential  ?                          delayed complications were discussed with the  ?                          patient. Return to normal activities tomorrow.  ?                          Written discharge  instructions were provided to the  ?                          patient. ?                          - Resume previous diet. ?                          - Continue present medications. ?                          - Await pathology results. ?                          - No recommendation at this time regarding repeat  ?                          colonoscopy due to age at next  ?                          screening/surveillance interval. ?Jerene Bears, MD ?09/21/2021 9:04:58 AM ?This report has been signed electronically. ?

## 2021-09-25 ENCOUNTER — Telehealth: Payer: Self-pay

## 2021-09-25 ENCOUNTER — Encounter: Payer: Self-pay | Admitting: Internal Medicine

## 2021-09-25 NOTE — Telephone Encounter (Signed)
?  Follow up Call- ? ? ?  09/21/2021  ?  7:32 AM  ?Call back number  ?Post procedure Call Back phone  # [phone]  ?Permission to leave phone message Yes  ?  ? ?Patient questions: ? ?Do you have a fever, pain , or abdominal swelling? No. ?Pain Score  0 * ? ?Have you tolerated food without any problems? Yes.   ? ?Have you been able to return to your normal activities? Yes.   ? ?Do you have any questions about your discharge instructions: ?Diet   No. ?Medications  No. ?Follow up visit  No. ? ?Do you have questions or concerns about your Care? No. ? ?Actions: ?* If pain score is 4 or above: ?No action needed, pain <4. ? ?Pt reports she has a lage bruise at her rt a/c site.  She said it is no pain, "just looks ugly".  I advised her she could place a warm compress to site.  That it will take time for the bruise to be absorbed.  maw ? ? ?

## 2021-10-04 DIAGNOSIS — E041 Nontoxic single thyroid nodule: Secondary | ICD-10-CM | POA: Diagnosis not present

## 2021-10-16 ENCOUNTER — Encounter: Payer: Self-pay | Admitting: Internal Medicine

## 2021-10-16 ENCOUNTER — Ambulatory Visit (INDEPENDENT_AMBULATORY_CARE_PROVIDER_SITE_OTHER): Payer: Medicare Other | Admitting: Internal Medicine

## 2021-10-16 VITALS — BP 142/82 | HR 75 | Temp 98.7°F | Resp 18 | Ht 64.0 in | Wt 152.0 lb

## 2021-10-16 DIAGNOSIS — E7800 Pure hypercholesterolemia, unspecified: Secondary | ICD-10-CM | POA: Diagnosis not present

## 2021-10-16 DIAGNOSIS — E049 Nontoxic goiter, unspecified: Secondary | ICD-10-CM

## 2021-10-16 DIAGNOSIS — F439 Reaction to severe stress, unspecified: Secondary | ICD-10-CM

## 2021-10-16 DIAGNOSIS — I10 Essential (primary) hypertension: Secondary | ICD-10-CM

## 2021-10-16 DIAGNOSIS — I479 Paroxysmal tachycardia, unspecified: Secondary | ICD-10-CM | POA: Diagnosis not present

## 2021-10-16 DIAGNOSIS — D72829 Elevated white blood cell count, unspecified: Secondary | ICD-10-CM

## 2021-10-16 DIAGNOSIS — R739 Hyperglycemia, unspecified: Secondary | ICD-10-CM | POA: Diagnosis not present

## 2021-10-16 NOTE — Progress Notes (Signed)
Patient ID: Cheyenne Baker, female   DOB: October 02, 1950, 71 y.o.   MRN: [id number] ? ? ?Subjective:  ? ? Patient ID: Cheyenne Baker, female    DOB: [birth year], 71 y.o.   MRN: [id number] ? ?This visit occurred during the SARS-CoV-2 public health emergency.  Safety protocols were in place, including screening questions prior to the visit, additional usage of staff PPE, and extensive cleaning of exam room while observing appropriate contact time as indicated for disinfecting solutions.  ? ?Patient here for a scheduled follow up.  ? ?Chief Complaint  ?Patient presents with  ? Follow-up  ?  5wk f/u to check b/p  ? .  ? ?HPI ?Last visit, amlodipine was increased to 14mq day.  Here to follow up regarding blood pressure.  Tries to stay active.  Overall she feels she is doing well.  No chest pain or sob reported.  No abdominal pain or bowel change reported.  Seeing Dr SGabriel Carinafor her thyroid.  ? ? ?Past Medical History:  ?Diagnosis Date  ? Allergy   ? Diverticulosis   ? Enlarged thyroid   ? Hyperlipidemia   ? Hypertension   ? [place]   ? Right carotid bruit   ? ?Past Surgical History:  ?Procedure Laterality Date  ? ABDOMINAL HYSTERECTOMY  1980  ? ovaries not removed, secondary to bleeding  ? CHOLECYSTECTOMY  1995  ? COLONOSCOPY    ? TONSILLECTOMY AND ADENOIDECTOMY  1975  ? ?Family History  ?Problem Relation Age of Onset  ? Hypertension Mother   ? Hyperlipidemia Mother   ? Heart disease Father   ? Hypertension Father   ? Breast cancer Daughter 486 ? Colon cancer Neg Hx   ? Colon polyps Neg Hx   ? Rectal cancer Neg Hx   ? Esophageal cancer Neg Hx   ? Stomach cancer Neg Hx   ? ?Social History  ? ?Socioeconomic History  ? Marital status: Widowed  ?  Spouse name: Not on file  ? Number of children: Not on file  ? Years of education: Not on file  ? Highest education level: Not on file  ?Occupational History  ? Not on file  ?Tobacco Use  ? Smoking status: Former  ?  Packs/day: 0.50  ?  Years: 20.00  ?  Pack years: 10.00   ?  Types: Cigarettes  ? Smokeless tobacco: Never  ?Vaping Use  ? Vaping Use: Never used  ?Substance and Sexual Activity  ? Alcohol use: Yes  ?  Alcohol/week: 0.0 standard drinks  ?  Comment: occasionally  ? Drug use: No  ? Sexual activity: Yes  ?Other Topics Concern  ? Not on file  ?Social History Narrative  ? Not on file  ? ?Social Determinants of Health  ? ?Financial Resource Strain: Low Risk   ? Difficulty of Paying Living Expenses: Not hard at all  ?Food Insecurity: No Food Insecurity  ? Worried About RCharity fundraiserin the Last Year: Never true  ? Ran Out of Food in the Last Year: Never true  ?Transportation Needs: No Transportation Needs  ? Lack of Transportation (Medical): No  ? Lack of Transportation (Non-Medical): No  ?Physical Activity: Not on file  ?Stress: No Stress Concern Present  ? Feeling of Stress : Not at all  ?Social Connections: Unknown  ? Frequency of Communication with Friends and Family: More than three times a week  ? Frequency of Social Gatherings with Friends and Family: More than three  times a week  ? Attends Religious Services: Not on file  ? Active Member of Clubs or Organizations: Not on file  ? Attends Archivist Meetings: Not on file  ? Marital Status: Widowed  ? ? ? ?Review of Systems  ?Constitutional:  Negative for appetite change and unexpected weight change.  ?HENT:  Negative for congestion and sinus pressure.   ?Respiratory:  Negative for cough, chest tightness and shortness of breath.   ?Cardiovascular:  Negative for chest pain, palpitations and leg swelling.  ?Gastrointestinal:  Negative for abdominal pain, diarrhea, nausea and vomiting.  ?Genitourinary:  Negative for difficulty urinating and dysuria.  ?Musculoskeletal:  Negative for joint swelling and myalgias.  ?Skin:  Negative for color change and rash.  ?Neurological:  Negative for dizziness, light-headedness and headaches.  ?Psychiatric/Behavioral:  Negative for agitation and dysphoric mood.   ? ?    ?Objective:  ?  ? ?BP (!) 142/82 (BP Location: Left Arm, Patient Position: Sitting, Cuff Size: Small)   Pulse 75   Temp 98.7 ?F (37.1 ?C) (Temporal)   Resp 18   Ht '5\' 4"'$  (1.626 m)   Wt 152 lb (68.9 kg)   SpO2 97%   BMI 26.09 kg/m?  ?Wt Readings from Last 3 Encounters:  ?10/16/21 152 lb (68.9 kg)  ?09/21/21 150 lb (68 kg)  ?09/12/21 150 lb (68 kg)  ? ? ?Physical Exam ?Vitals reviewed.  ?Constitutional:   ?   General: She is not in acute distress. ?   Appearance: Normal appearance.  ?HENT:  ?   Head: Normocephalic and atraumatic.  ?   Right Ear: External ear normal.  ?   Left Ear: External ear normal.  ?Eyes:  ?   General: No scleral icterus.    ?   Right eye: No discharge.     ?   Left eye: No discharge.  ?   Conjunctiva/sclera: Conjunctivae normal.  ?Neck:  ?   Thyroid: No thyromegaly.  ?   Comments: Enlarged palpable thyroid.  ?Cardiovascular:  ?   Rate and Rhythm: Normal rate and regular rhythm.  ?Pulmonary:  ?   Effort: No respiratory distress.  ?   Breath sounds: Normal breath sounds. No wheezing.  ?Abdominal:  ?   General: Bowel sounds are normal.  ?   Palpations: Abdomen is soft.  ?   Tenderness: There is no abdominal tenderness.  ?Musculoskeletal:     ?   General: No swelling or tenderness.  ?   Cervical back: Neck supple. No tenderness.  ?Lymphadenopathy:  ?   Cervical: No cervical adenopathy.  ?Skin: ?   Findings: No erythema or rash.  ?Neurological:  ?   Mental Status: She is alert.  ?Psychiatric:     ?   Mood and Affect: Mood normal.     ?   Behavior: Behavior normal.  ? ? ? ?Outpatient Encounter Medications as of 10/16/2021  ?Medication Sig  ? amLODipine (NORVASC) 10 MG tablet Take 1 tablet (10 mg total) by mouth daily.  ? aspirin EC 81 MG tablet Take 81 mg by mouth daily.  ? atenolol (TENORMIN) 50 MG tablet TAKE 1 TABLET BY MOUTH DAILY  ? cetirizine (ZYRTEC ALLERGY) 10 MG tablet Take 1 tablet (10 mg total) by mouth daily.  ? losartan-hydrochlorothiazide (HYZAAR) 100-12.5 MG tablet Take 1 tablet  by mouth daily.  ? rosuvastatin (CRESTOR) 20 MG tablet TAKE 1 TABLET BY MOUTH DAILY  ? [DISCONTINUED] potassium chloride (KLOR-CON) 10 MEQ tablet Take 1 tablet (10 mEq  total) by mouth daily.  ? ?No facility-administered encounter medications on file as of 10/16/2021.  ?  ? ?Lab Results  ?Component Value Date  ? WBC 10.5 10/16/2021  ? HGB 13.3 10/16/2021  ? HCT 39.0 10/16/2021  ? PLT 312.0 10/16/2021  ? GLUCOSE 107 (H) 10/16/2021  ? CHOL 170 07/25/2021  ? TRIG 109.0 07/25/2021  ? HDL 62.30 07/25/2021  ? LDLDIRECT 143.3 06/11/2013  ? [place] 86 07/25/2021  ? ALT 20 07/25/2021  ? AST 22 07/25/2021  ? NA 143 10/16/2021  ? K 3.1 (L) 10/16/2021  ? CL 107 10/16/2021  ? CREATININE 1.12 10/16/2021  ? BUN 21 10/16/2021  ? CO2 28 10/16/2021  ? TSH 1.05 07/25/2021  ? HGBA1C 5.7 07/25/2021  ? ? ?MM 3D SCREEN BREAST BILATERAL ? ?Result Date: 08/03/2021 ?CLINICAL DATA:  Screening. EXAM: DIGITAL SCREENING BILATERAL MAMMOGRAM WITH TOMOSYNTHESIS AND CAD TECHNIQUE: Bilateral screening digital craniocaudal and mediolateral oblique mammograms were obtained. Bilateral screening digital breast tomosynthesis was performed. The images were evaluated with computer-aided detection. COMPARISON:  Previous exam(s). ACR Breast Density Category c: The breast tissue is heterogeneously dense, which may obscure small masses. FINDINGS: There are no findings suspicious for malignancy. IMPRESSION: No mammographic evidence of malignancy. A result letter of this screening mammogram will be mailed directly to the patient. RECOMMENDATION: Screening mammogram in one year. (Code:SM-B-01Y) BI-RADS CATEGORY  1: Negative. Electronically Signed   By: Audie Pinto M.D.   On: 08/03/2021 13:47 ? ? ?   ?Assessment & Plan:  ? ?Problem List Items Addressed This Visit   ? ? Enlarged thyroid  ?  Previously evaluated by ENT and Dr Gabriel Carina.  Biopsy - benign colloid nodule.   Recently reevaluated by Dr Gabriel Carina.  Continue f/u with endocrinology.  ? ?  ?  ? Essential  hypertension - Primary  ?  Blood pressure slightly elevated today.  Recheck improved. On losartan/hctz and atenolol. Currently taking amlodipine '10mg'$  q day.  Follow pressures.  Follow metabolic panel.  Send in readings.  Hold

## 2021-10-17 LAB — BASIC METABOLIC PANEL
BUN: 21 mg/dL (ref 6–23)
CO2: 28 mEq/L (ref 19–32)
Calcium: 9.7 mg/dL (ref 8.4–10.5)
Chloride: 107 mEq/L (ref 96–112)
Creatinine, Ser: 1.12 mg/dL (ref 0.40–1.20)
GFR: 48.9 mL/min — ABNORMAL LOW (ref >60.00)
Glucose, Bld: 107 mg/dL — ABNORMAL HIGH (ref 70–99)
Potassium: 3.1 mEq/L — ABNORMAL LOW (ref 3.5–5.1)
Sodium: 143 mEq/L (ref 135–145)

## 2021-10-17 LAB — CBC WITH DIFFERENTIAL/PLATELET
Basophils Absolute: 0.1 10*3/uL (ref 0.0–0.1)
Basophils Relative: 0.6 % (ref 0.0–3.0)
Eosinophils Absolute: 0.2 10*3/uL (ref 0.0–0.7)
Eosinophils Relative: 1.7 % (ref 0.0–5.0)
HCT: 39 % (ref 36.0–46.0)
Hemoglobin: 13.3 g/dL (ref 12.0–15.0)
Lymphocytes Relative: 20.1 % (ref 12.0–46.0)
Lymphs Abs: 2.1 10*3/uL (ref 0.7–4.0)
MCHC: 34.1 g/dL (ref 30.0–36.0)
MCV: 91.7 fl (ref 78.0–100.0)
Monocytes Absolute: 0.8 10*3/uL (ref 0.1–1.0)
Monocytes Relative: 7.9 % (ref 3.0–12.0)
Neutro Abs: 7.3 10*3/uL (ref 1.4–7.7)
Neutrophils Relative %: 69.7 % (ref 43.0–77.0)
Platelets: 312 10*3/uL (ref 150.0–400.0)
RBC: 4.25 Mil/uL (ref 3.87–5.11)
RDW: 13.4 % (ref 11.5–15.5)
WBC: 10.5 10*3/uL (ref 4.0–10.5)

## 2021-10-18 ENCOUNTER — Other Ambulatory Visit: Payer: Self-pay

## 2021-10-18 DIAGNOSIS — E876 Hypokalemia: Secondary | ICD-10-CM

## 2021-10-18 MED ORDER — POTASSIUM CHLORIDE ER 10 MEQ PO TBCR: 10.0000 meq | EXTENDED_RELEASE_TABLET | Freq: Two times a day (BID) | ORAL | 0 refills | Status: DC

## 2021-10-18 NOTE — Addendum Note (Signed)
Addended by: Cheri Rous E on: 10/18/2021 04:04 PM ? ? Modules accepted: Orders ? ?

## 2021-10-22 ENCOUNTER — Encounter: Payer: Self-pay | Admitting: Internal Medicine

## 2021-10-22 NOTE — Assessment & Plan Note (Signed)
Previously evaluated by ENT and Dr Gabriel Carina.  Biopsy - benign colloid nodule.   Recently reevaluated by Dr Gabriel Carina.  Continue f/u with endocrinology.  ?

## 2021-10-22 NOTE — Assessment & Plan Note (Signed)
On atenolol.  No increased heart rate or palpitations.  Follow.   

## 2021-10-22 NOTE — Assessment & Plan Note (Signed)
Overall appears to be handling things relatively well.  Notify me if feels needs any further intervention.  ?

## 2021-10-22 NOTE — Assessment & Plan Note (Signed)
Blood pressure slightly elevated today.  Recheck improved. On losartan/hctz and atenolol. Currently taking amlodipine '10mg'$  q day.  Follow pressures.  Follow metabolic panel.  Send in readings.  Hold on changes.  Follow.  ?

## 2021-10-22 NOTE — Assessment & Plan Note (Signed)
On crestor.  Low cholesterol diet and exercise.  Follow lipid panel and liver function tests.   

## 2021-10-22 NOTE — Assessment & Plan Note (Signed)
Low carb diet and exercise.  Follow met b and a1c.  ?

## 2021-10-25 ENCOUNTER — Other Ambulatory Visit: Payer: Medicare Other

## 2021-10-25 ENCOUNTER — Other Ambulatory Visit (INDEPENDENT_AMBULATORY_CARE_PROVIDER_SITE_OTHER): Payer: Medicare Other

## 2021-10-25 DIAGNOSIS — E876 Hypokalemia: Secondary | ICD-10-CM

## 2021-10-25 LAB — BASIC METABOLIC PANEL
BUN: 14 mg/dL (ref 6–23)
CO2: 30 mEq/L (ref 19–32)
Calcium: 10 mg/dL (ref 8.4–10.5)
Chloride: 103 mEq/L (ref 96–112)
Creatinine, Ser: 1.17 mg/dL (ref 0.40–1.20)
GFR: 46.4 mL/min — ABNORMAL LOW (ref >60.00)
Glucose, Bld: 126 mg/dL — ABNORMAL HIGH (ref 70–99)
Potassium: 3.3 mEq/L — ABNORMAL LOW (ref 3.5–5.1)
Sodium: 139 mEq/L (ref 135–145)

## 2021-10-31 ENCOUNTER — Other Ambulatory Visit: Payer: Self-pay

## 2021-10-31 DIAGNOSIS — E876 Hypokalemia: Secondary | ICD-10-CM

## 2021-11-14 ENCOUNTER — Other Ambulatory Visit: Payer: Self-pay | Admitting: Internal Medicine

## 2021-11-16 NOTE — Telephone Encounter (Signed)
Refilled: 10/18/2021 Last OV: 10/16/2021 Next OV: 01/16/2022  Last BMP: potassium 3.3 Next BMP scheduled for 11/21/2021

## 2021-11-17 NOTE — Telephone Encounter (Signed)
Rx ok'd for kcl bid #60 with one refills.  F/u potassium scheduled.

## 2021-11-21 ENCOUNTER — Other Ambulatory Visit (INDEPENDENT_AMBULATORY_CARE_PROVIDER_SITE_OTHER): Payer: Medicare Other

## 2021-11-21 DIAGNOSIS — E876 Hypokalemia: Secondary | ICD-10-CM | POA: Diagnosis not present

## 2021-11-21 DIAGNOSIS — E785 Hyperlipidemia, unspecified: Secondary | ICD-10-CM | POA: Diagnosis not present

## 2021-11-21 DIAGNOSIS — E041 Nontoxic single thyroid nodule: Secondary | ICD-10-CM | POA: Diagnosis not present

## 2021-11-21 DIAGNOSIS — E042 Nontoxic multinodular goiter: Secondary | ICD-10-CM | POA: Diagnosis not present

## 2021-11-21 DIAGNOSIS — Z7982 Long term (current) use of aspirin: Secondary | ICD-10-CM | POA: Diagnosis not present

## 2021-11-21 DIAGNOSIS — I10 Essential (primary) hypertension: Secondary | ICD-10-CM | POA: Diagnosis not present

## 2021-11-21 DIAGNOSIS — Z79899 Other long term (current) drug therapy: Secondary | ICD-10-CM | POA: Diagnosis not present

## 2021-11-21 DIAGNOSIS — Z87891 Personal history of nicotine dependence: Secondary | ICD-10-CM | POA: Diagnosis not present

## 2021-11-21 LAB — BASIC METABOLIC PANEL
BUN: 15 mg/dL (ref 6–23)
CO2: 30 mEq/L (ref 19–32)
Calcium: 10.3 mg/dL (ref 8.4–10.5)
Chloride: 102 mEq/L (ref 96–112)
Creatinine, Ser: 1.16 mg/dL (ref 0.40–1.20)
GFR: 46.85 mL/min — ABNORMAL LOW (ref >60.00)
Glucose, Bld: 94 mg/dL (ref 70–99)
Potassium: 3.7 mEq/L (ref 3.5–5.1)
Sodium: 140 mEq/L (ref 135–145)

## 2021-11-22 ENCOUNTER — Other Ambulatory Visit: Payer: Self-pay

## 2021-11-22 DIAGNOSIS — E876 Hypokalemia: Secondary | ICD-10-CM

## 2021-11-30 DIAGNOSIS — E210 Primary hyperparathyroidism: Secondary | ICD-10-CM | POA: Diagnosis not present

## 2021-12-02 ENCOUNTER — Other Ambulatory Visit: Payer: Self-pay | Admitting: Internal Medicine

## 2021-12-06 ENCOUNTER — Other Ambulatory Visit (INDEPENDENT_AMBULATORY_CARE_PROVIDER_SITE_OTHER): Payer: Medicare Other

## 2021-12-06 DIAGNOSIS — E876 Hypokalemia: Secondary | ICD-10-CM | POA: Diagnosis not present

## 2021-12-06 LAB — BASIC METABOLIC PANEL
BUN: 15 mg/dL (ref 6–23)
CO2: 29 mEq/L (ref 19–32)
Calcium: 10.2 mg/dL (ref 8.4–10.5)
Chloride: 103 mEq/L (ref 96–112)
Creatinine, Ser: 1.17 mg/dL (ref 0.40–1.20)
GFR: 46.36 mL/min — ABNORMAL LOW (ref >60.00)
Glucose, Bld: 79 mg/dL (ref 70–99)
Potassium: 3.7 mEq/L (ref 3.5–5.1)
Sodium: 141 mEq/L (ref 135–145)

## 2021-12-07 ENCOUNTER — Telehealth: Payer: Self-pay | Admitting: Internal Medicine

## 2021-12-07 NOTE — Telephone Encounter (Signed)
Patient called and stated she wanted to speak to Dr Nicki Reaper. She states that she is seeing two doctors and would like to speak to her about the medications they have added to her medications.

## 2021-12-07 NOTE — Telephone Encounter (Signed)
S/w pt - stated she has been put on Vitamin D for 8 wks by Dr. Maudie Mercury due to low level on blood work. Wanted to be sure that we are getting results and notes from his office. Has upcoming thyroid surgery. Looks like everything is in chart through [facility].

## 2021-12-08 NOTE — Telephone Encounter (Signed)
Reviewed chart.  Reviewed labs.  Receiving vitamin D replacement.  Thank her for calling and updating [place].  Please see if she needs anything more.  Also, let me know if she needs to speak with me.  I can call her if needed.

## 2022-01-03 ENCOUNTER — Telehealth: Payer: Self-pay | Admitting: Internal Medicine

## 2022-01-03 ENCOUNTER — Other Ambulatory Visit: Payer: Self-pay

## 2022-01-03 DIAGNOSIS — E7800 Pure hypercholesterolemia, unspecified: Secondary | ICD-10-CM

## 2022-01-03 DIAGNOSIS — R739 Hyperglycemia, unspecified: Secondary | ICD-10-CM

## 2022-01-03 DIAGNOSIS — I10 Essential (primary) hypertension: Secondary | ICD-10-CM

## 2022-01-03 NOTE — Telephone Encounter (Signed)
placed

## 2022-01-03 NOTE — Telephone Encounter (Signed)
Patient has a lab appt 01/10/2022, there are no orders in.

## 2022-01-10 ENCOUNTER — Other Ambulatory Visit (INDEPENDENT_AMBULATORY_CARE_PROVIDER_SITE_OTHER): Payer: Medicare Other

## 2022-01-10 DIAGNOSIS — R739 Hyperglycemia, unspecified: Secondary | ICD-10-CM

## 2022-01-10 DIAGNOSIS — E7800 Pure hypercholesterolemia, unspecified: Secondary | ICD-10-CM

## 2022-01-10 DIAGNOSIS — I10 Essential (primary) hypertension: Secondary | ICD-10-CM

## 2022-01-10 LAB — LIPID PANEL
Cholesterol: 152 mg/dL (ref 0–200)
HDL: 57.5 mg/dL (ref >39.00)
LDL Cholesterol: 74 mg/dL (ref 0–99)
NonHDL: 94.67
Total CHOL/HDL Ratio: 3
Triglycerides: 102 mg/dL (ref 0.0–149.0)
VLDL: 20.4 mg/dL (ref 0.0–40.0)

## 2022-01-10 LAB — BASIC METABOLIC PANEL
BUN: 14 mg/dL (ref 6–23)
CO2: 29 mEq/L (ref 19–32)
Calcium: 10 mg/dL (ref 8.4–10.5)
Chloride: 102 mEq/L (ref 96–112)
Creatinine, Ser: 1.01 mg/dL (ref 0.40–1.20)
GFR: 55.27 mL/min — ABNORMAL LOW (ref >60.00)
Glucose, Bld: 95 mg/dL (ref 70–99)
Potassium: 3.8 mEq/L (ref 3.5–5.1)
Sodium: 139 mEq/L (ref 135–145)

## 2022-01-10 LAB — HEMOGLOBIN A1C: Hgb A1c MFr Bld: 5.8 % (ref 4.6–6.5)

## 2022-01-10 LAB — HEPATIC FUNCTION PANEL
ALT: 17 U/L (ref 0–35)
AST: 21 U/L (ref 0–37)
Albumin: 4.4 g/dL (ref 3.5–5.2)
Alkaline Phosphatase: 53 U/L (ref 39–117)
Bilirubin, Direct: 0.1 mg/dL (ref 0.0–0.3)
Total Bilirubin: 0.7 mg/dL (ref 0.2–1.2)
Total Protein: 7.2 g/dL (ref 6.0–8.3)

## 2022-01-14 ENCOUNTER — Other Ambulatory Visit: Payer: Self-pay | Admitting: Internal Medicine

## 2022-01-16 ENCOUNTER — Ambulatory Visit (INDEPENDENT_AMBULATORY_CARE_PROVIDER_SITE_OTHER): Payer: Medicare Other | Admitting: Internal Medicine

## 2022-01-16 ENCOUNTER — Encounter: Payer: Self-pay | Admitting: Internal Medicine

## 2022-01-16 VITALS — BP 122/68 | HR 76 | Temp 97.6°F | Resp 21 | Ht 64.0 in | Wt 147.3 lb

## 2022-01-16 DIAGNOSIS — I479 Paroxysmal tachycardia, unspecified: Secondary | ICD-10-CM

## 2022-01-16 DIAGNOSIS — F439 Reaction to severe stress, unspecified: Secondary | ICD-10-CM

## 2022-01-16 DIAGNOSIS — E049 Nontoxic goiter, unspecified: Secondary | ICD-10-CM | POA: Diagnosis not present

## 2022-01-16 DIAGNOSIS — E7800 Pure hypercholesterolemia, unspecified: Secondary | ICD-10-CM

## 2022-01-16 DIAGNOSIS — N1831 Chronic kidney disease, stage 3a: Secondary | ICD-10-CM | POA: Diagnosis not present

## 2022-01-16 DIAGNOSIS — R739 Hyperglycemia, unspecified: Secondary | ICD-10-CM | POA: Diagnosis not present

## 2022-01-16 DIAGNOSIS — I10 Essential (primary) hypertension: Secondary | ICD-10-CM

## 2022-01-16 MED ORDER — LOSARTAN POTASSIUM-HCTZ 100-12.5 MG PO TABS: 1.0000 | ORAL_TABLET | Freq: Every day | ORAL | 1 refills | Status: DC

## 2022-01-16 MED ORDER — AMLODIPINE BESYLATE 10 MG PO TABS: 10.0000 mg | ORAL_TABLET | Freq: Every day | ORAL | 1 refills | Status: DC

## 2022-01-16 NOTE — Progress Notes (Signed)
Patient ID: Cheyenne Baker, female   DOB: 10-20-1950, 71 y.o.   MRN: [id number]   Subjective:    Patient ID: Cheyenne Baker, female    DOB: [birth year], 71 y.o.   MRN: [id number]   Patient here for a scheduled follow up.   Chief Complaint  Patient presents with   Hypertension   Hyperlipidemia   .   Hypertension Pertinent negatives include no chest pain, headaches, palpitations or shortness of breath.  Hyperlipidemia Pertinent negatives include no chest pain, myalgias or shortness of breath.   Increased stress with her mother's health issues.  Discussed.  Has support from her sisters.  No chest pain or sob reported.  Stays active.  No acid reflux.  No abdominal pain.  Bowels moving.  Had to post pone her thyroid surgery - due to low vitamin D.  Taking supplements.  Planning f/u to recheck.  Discussed recent labs.     Past Medical History:  Diagnosis Date   Allergy    Diverticulosis    Enlarged thyroid    Hyperlipidemia    Hypertension    Paroxysmal tachycardia ([place])    Right carotid bruit    Past Surgical History:  Procedure Laterality Date   ABDOMINAL HYSTERECTOMY  1980   ovaries not removed, secondary to bleeding   [place]   Family History  Problem Relation Age of Onset   Hypertension Mother    Hyperlipidemia Mother    Heart disease Father    Hypertension Father    Breast cancer Daughter 76   Colon cancer Neg Hx    Colon polyps Neg Hx    Rectal cancer Neg Hx    Esophageal cancer Neg Hx    Stomach cancer Neg Hx    Social History   Socioeconomic History   Marital status: Widowed    Spouse name: Not on file   Number of children: Not on file   Years of education: Not on file   Highest education level: Not on file  Occupational History   Not on file  Tobacco Use   Smoking status: Former    Packs/day: 0.50    Years: 20.00    Total pack years: 10.00    Types: Cigarettes   Smokeless  tobacco: Never  Vaping Use   Vaping Use: Never used  Substance and Sexual Activity   Alcohol use: Yes    Alcohol/week: 0.0 standard drinks of alcohol    Comment: occasionally   Drug use: No   Sexual activity: Yes  Other Topics Concern   Not on file  Social History Narrative   Not on file   Social Determinants of Health   Financial Resource Strain: Low Risk  (07/14/2021)   Overall Financial Resource Strain (CARDIA)    Difficulty of Paying Living Expenses: Not hard at all  Food Insecurity: No Food Insecurity (07/14/2021)   Hunger Vital Sign    Worried About Running Out of Food in the Last Year: Never true    Ran Out of Food in the Last Year: Never true  Transportation Needs: No Transportation Needs (07/14/2021)   PRAPARE - Hydrologist (Medical): No    Lack of Transportation (Non-Medical): No  Physical Activity: Unknown (07/13/2019)   Exercise Vital Sign    Days of Exercise per Week: 0 days    Minutes of Exercise per Session: Not on file  Stress: No Stress Concern  Present (07/14/2021)   [place]    Feeling of Stress : Not at all  Social Connections: Unknown (07/14/2021)   Social Connection and Isolation Panel [NHANES]    Frequency of Communication with Friends and Family: More than three times a week    Frequency of Social Gatherings with Friends and Family: More than three times a week    Attends Religious Services: Not on file    Active Member of Clubs or Organizations: Not on file    Attends Archivist Meetings: Not on file    Marital Status: Widowed     Review of Systems  Constitutional:  Negative for appetite change and unexpected weight change.  HENT:  Negative for congestion and sinus pressure.   Respiratory:  Negative for cough, chest tightness and shortness of breath.   Cardiovascular:  Negative for chest pain, palpitations and leg swelling.  Gastrointestinal:   Negative for abdominal pain, diarrhea, nausea and vomiting.  Genitourinary:  Negative for difficulty urinating and dysuria.  Musculoskeletal:  Negative for joint swelling and myalgias.  Skin:  Negative for color change and rash.  Neurological:  Negative for dizziness, light-headedness and headaches.  Psychiatric/Behavioral:  Negative for agitation and dysphoric mood.        Objective:     BP 122/68   Pulse 76   Temp 97.6 F (36.4 C) (Oral)   Resp (!) 21   Ht _0  (1.626 m)   Wt 147 lb 4.8 oz (66.8 kg)   SpO2 99%   BMI 25.28 kg/m  Wt Readings from Last 3 Encounters:  01/16/22 147 lb 4.8 oz (66.8 kg)  10/16/21 152 lb (68.9 kg)  09/21/21 150 lb (68 kg)    Physical Exam Vitals reviewed.  Constitutional:      General: She is not in acute distress.    Appearance: Normal appearance.  HENT:     Head: Normocephalic and atraumatic.     Right Ear: External ear normal.     Left Ear: External ear normal.  Eyes:     General: No scleral icterus.       Right eye: No discharge.        Left eye: No discharge.     Conjunctiva/sclera: Conjunctivae normal.  Neck:     Thyroid: No thyromegaly.  Cardiovascular:     Rate and Rhythm: Normal rate and regular rhythm.  Pulmonary:     Effort: No respiratory distress.     Breath sounds: Normal breath sounds. No wheezing.  Abdominal:     General: Bowel sounds are normal.     Palpations: Abdomen is soft.     Tenderness: There is no abdominal tenderness.  Musculoskeletal:        General: No swelling or tenderness.     Cervical back: Neck supple. No tenderness.  Lymphadenopathy:     Cervical: No cervical adenopathy.  Skin:    Findings: No erythema or rash.  Neurological:     Mental Status: She is alert.  Psychiatric:        Mood and Affect: Mood normal.        Behavior: Behavior normal.      Outpatient Encounter Medications as of 01/16/2022  Medication Sig   aspirin EC 81 MG tablet Take 81 mg by mouth daily.   atenolol (TENORMIN)  50 MG tablet TAKE ONE TABLET BY MOUTH DAILY   cetirizine (ZYRTEC ALLERGY) 10 MG tablet Take 1 tablet (10 mg total) by  mouth daily.   potassium chloride (KLOR-CON) 10 MEQ tablet TAKE 1 TABLET BY MOUTH TWICE A DAY   rosuvastatin (CRESTOR) 20 MG tablet TAKE ONE TABLET BY MOUTH DAILY   amLODipine (NORVASC) 10 MG tablet Take 1 tablet (10 mg total) by mouth daily.   losartan-hydrochlorothiazide (HYZAAR) 100-12.5 MG tablet Take 1 tablet by mouth daily.   [DISCONTINUED] amLODipine (NORVASC) 10 MG tablet Take 1 tablet (10 mg total) by mouth daily.   [DISCONTINUED] losartan-hydrochlorothiazide (HYZAAR) 100-12.5 MG tablet Take 1 tablet by mouth daily.   No facility-administered encounter medications on file as of 01/16/2022.     Lab Results  Component Value Date   WBC 10.5 10/16/2021   HGB 13.3 10/16/2021   HCT 39.0 10/16/2021   PLT 312.0 10/16/2021   GLUCOSE 95 01/10/2022   CHOL 152 01/10/2022   TRIG 102.0 01/10/2022   HDL 57.50 01/10/2022   LDLDIRECT 143.3 06/11/2013   LDLCALC 74 01/10/2022   ALT 17 01/10/2022   AST 21 01/10/2022   NA 139 01/10/2022   K 3.8 01/10/2022   CL 102 01/10/2022   CREATININE 1.01 01/10/2022   BUN 14 01/10/2022   CO2 29 01/10/2022   TSH 1.05 07/25/2021   HGBA1C 5.8 01/10/2022    MM 3D SCREEN BREAST BILATERAL  Result Date: 08/03/2021 CLINICAL DATA:  Screening. EXAM: DIGITAL SCREENING BILATERAL MAMMOGRAM WITH TOMOSYNTHESIS AND CAD TECHNIQUE: Bilateral screening digital craniocaudal and mediolateral oblique mammograms were obtained. Bilateral screening digital breast tomosynthesis was performed. The images were evaluated with computer-aided detection. COMPARISON:  Previous exam(s). ACR Breast Density Category c: The breast tissue is heterogeneously dense, which may obscure small masses. FINDINGS: There are no findings suspicious for malignancy. IMPRESSION: No mammographic evidence of malignancy. A result letter of this screening mammogram will be mailed directly to  the patient. RECOMMENDATION: Screening mammogram in one year. (Code:SM-B-01Y) BI-RADS CATEGORY  1: Negative. Electronically Signed   By: Audie Pinto M.D.   On: 08/03/2021 13:47      Assessment & Plan:   Problem List Items Addressed This Visit     CKD (chronic kidney disease), stage III ([place])    Discussed. Avoid antiinflammatories.  Stay hydrated.  On losartan. Had discussed renal ultrasound.  Recent GFR 55 - improved.  Given everything she has going on, will hold on renal ultrasound at this time.   Follow.        Enlarged thyroid    Previously evaluated by ENT and Dr Gabriel Carina.  Biopsy - benign colloid nodule.   Recently reevaluated by Dr Gabriel Carina.  Continue f/u with endocrinology and planning thyroid surgery in 02/2022.       Essential hypertension - Primary    Blood pressure on recheck improved and wnl.  Continue losartan/hctz, atenolol and amlodipine.  Follow pressures.  Hold on changing medications.  Check metabolic panel.       Relevant Medications   amLODipine (NORVASC) 10 MG tablet   losartan-hydrochlorothiazide (HYZAAR) 100-12.5 MG tablet   Other Relevant Orders   Basic Metabolic Panel (BMET)   Hypercholesterolemia    On crestor.  Low cholesterol diet and exercise.  Follow lipid panel and liver function tests.   Lab Results  Component Value Date   CHOL 152 01/10/2022   HDL 57.50 01/10/2022   LDLCALC 74 01/10/2022   LDLDIRECT 143.3 06/11/2013   TRIG 102.0 01/10/2022   CHOLHDL 3 01/10/2022       Relevant Medications   amLODipine (NORVASC) 10 MG tablet   losartan-hydrochlorothiazide (HYZAAR) 100-12.5 MG tablet  Other Relevant Orders   Lipid Profile   Hepatic function panel   Hyperglycemia    Low carb diet and exercise.  Follow met b and a1c.       Relevant Orders   HgB A1c   Paroxysmal tachycardia ([facility])    On atenolol.  No increased heart rate or palpitations.  Follow.        Relevant Medications   amLODipine (NORVASC) 10 MG tablet    losartan-hydrochlorothiazide (HYZAAR) 100-12.5 MG tablet   Stress    Increased stress as outlined.  Discussed.  Does not feel needs any further intervention at this time.  Follow.         Einar Pheasant, MD

## 2022-01-22 ENCOUNTER — Encounter: Payer: Self-pay | Admitting: Internal Medicine

## 2022-01-22 DIAGNOSIS — N183 Chronic kidney disease, stage 3 unspecified: Secondary | ICD-10-CM | POA: Insufficient documentation

## 2022-01-22 NOTE — Assessment & Plan Note (Signed)
Increased stress as outlined.  Discussed.  Does not feel needs any further intervention at this time.  Follow.  

## 2022-01-22 NOTE — Assessment & Plan Note (Signed)
Blood pressure on recheck improved and wnl.  Continue losartan/hctz, atenolol and amlodipine.  Follow pressures.  Hold on changing medications.  Check metabolic panel.

## 2022-01-22 NOTE — Assessment & Plan Note (Signed)
Discussed. Avoid antiinflammatories.  Stay hydrated.  On losartan. Had discussed renal ultrasound.  Recent GFR 55 - improved.  Given everything she has going on, will hold on renal ultrasound at this time.   Follow.

## 2022-01-22 NOTE — Assessment & Plan Note (Signed)
On atenolol.  No increased heart rate or palpitations.  Follow.   

## 2022-01-22 NOTE — Assessment & Plan Note (Signed)
Previously evaluated by ENT and Dr Gabriel Carina.  Biopsy - benign colloid nodule.   Recently reevaluated by Dr Gabriel Carina.  Continue f/u with endocrinology and planning thyroid surgery in 02/2022.

## 2022-01-22 NOTE — Assessment & Plan Note (Addendum)
On crestor.  Low cholesterol diet and exercise.  Follow lipid panel and liver function tests.   Lab Results  Component Value Date   CHOL 152 01/10/2022   HDL 57.50 01/10/2022   LDLCALC 74 01/10/2022   LDLDIRECT 143.3 06/11/2013   TRIG 102.0 01/10/2022   CHOLHDL 3 01/10/2022

## 2022-01-22 NOTE — Assessment & Plan Note (Signed)
Low carb diet and exercise.  Follow met b and a1c.  

## 2022-02-06 DIAGNOSIS — E041 Nontoxic single thyroid nodule: Secondary | ICD-10-CM | POA: Diagnosis not present

## 2022-02-06 DIAGNOSIS — E213 Hyperparathyroidism, unspecified: Secondary | ICD-10-CM | POA: Diagnosis not present

## 2022-02-06 DIAGNOSIS — R7989 Other specified abnormal findings of blood chemistry: Secondary | ICD-10-CM | POA: Diagnosis not present

## 2022-02-23 DIAGNOSIS — E785 Hyperlipidemia, unspecified: Secondary | ICD-10-CM | POA: Diagnosis not present

## 2022-02-23 DIAGNOSIS — E213 Hyperparathyroidism, unspecified: Secondary | ICD-10-CM | POA: Diagnosis not present

## 2022-02-23 DIAGNOSIS — N189 Chronic kidney disease, unspecified: Secondary | ICD-10-CM | POA: Diagnosis not present

## 2022-02-23 DIAGNOSIS — Z79899 Other long term (current) drug therapy: Secondary | ICD-10-CM | POA: Diagnosis not present

## 2022-02-23 DIAGNOSIS — D34 Benign neoplasm of thyroid gland: Secondary | ICD-10-CM | POA: Diagnosis not present

## 2022-02-23 DIAGNOSIS — E041 Nontoxic single thyroid nodule: Secondary | ICD-10-CM | POA: Diagnosis not present

## 2022-02-23 DIAGNOSIS — I129 Hypertensive chronic kidney disease with stage 1 through stage 4 chronic kidney disease, or unspecified chronic kidney disease: Secondary | ICD-10-CM | POA: Diagnosis not present

## 2022-02-23 DIAGNOSIS — Z7982 Long term (current) use of aspirin: Secondary | ICD-10-CM | POA: Diagnosis not present

## 2022-02-23 DIAGNOSIS — Z87891 Personal history of nicotine dependence: Secondary | ICD-10-CM | POA: Diagnosis not present

## 2022-03-13 DIAGNOSIS — E890 Postprocedural hypothyroidism: Secondary | ICD-10-CM | POA: Diagnosis not present

## 2022-03-13 DIAGNOSIS — Z09 Encounter for follow-up examination after completed treatment for conditions other than malignant neoplasm: Secondary | ICD-10-CM | POA: Diagnosis not present

## 2022-03-14 ENCOUNTER — Telehealth: Payer: Self-pay

## 2022-03-14 MED ORDER — POTASSIUM CHLORIDE ER 10 MEQ PO TBCR: 10.0000 meq | EXTENDED_RELEASE_TABLET | Freq: Two times a day (BID) | ORAL | 1 refills | Status: DC

## 2022-03-14 NOTE — Telephone Encounter (Signed)
Close  

## 2022-04-12 DIAGNOSIS — Z23 Encounter for immunization: Secondary | ICD-10-CM | POA: Diagnosis not present

## 2022-04-23 ENCOUNTER — Encounter (INDEPENDENT_AMBULATORY_CARE_PROVIDER_SITE_OTHER): Payer: Self-pay

## 2022-05-10 ENCOUNTER — Other Ambulatory Visit: Payer: Self-pay | Admitting: Internal Medicine

## 2022-05-11 DIAGNOSIS — Z23 Encounter for immunization: Secondary | ICD-10-CM | POA: Diagnosis not present

## 2022-05-16 ENCOUNTER — Other Ambulatory Visit (INDEPENDENT_AMBULATORY_CARE_PROVIDER_SITE_OTHER): Payer: Medicare Other

## 2022-05-16 DIAGNOSIS — R739 Hyperglycemia, unspecified: Secondary | ICD-10-CM | POA: Diagnosis not present

## 2022-05-16 DIAGNOSIS — E7800 Pure hypercholesterolemia, unspecified: Secondary | ICD-10-CM

## 2022-05-16 DIAGNOSIS — I10 Essential (primary) hypertension: Secondary | ICD-10-CM

## 2022-05-16 LAB — LIPID PANEL
Cholesterol: 170 mg/dL (ref 0–200)
HDL: 62.6 mg/dL (ref >39.00)
LDL Cholesterol: 83 mg/dL (ref 0–99)
NonHDL: 107.09
Total CHOL/HDL Ratio: 3
Triglycerides: 118 mg/dL (ref 0.0–149.0)
VLDL: 23.6 mg/dL (ref 0.0–40.0)

## 2022-05-16 LAB — BASIC METABOLIC PANEL
BUN: 18 mg/dL (ref 6–23)
CO2: 31 mEq/L (ref 19–32)
Calcium: 10 mg/dL (ref 8.4–10.5)
Chloride: 102 mEq/L (ref 96–112)
Creatinine, Ser: 1.05 mg/dL (ref 0.40–1.20)
GFR: 52.62 mL/min — ABNORMAL LOW (ref >60.00)
Glucose, Bld: 92 mg/dL (ref 70–99)
Potassium: 4.3 mEq/L (ref 3.5–5.1)
Sodium: 140 mEq/L (ref 135–145)

## 2022-05-16 LAB — HEPATIC FUNCTION PANEL
ALT: 18 U/L (ref 0–35)
AST: 21 U/L (ref 0–37)
Albumin: 4.6 g/dL (ref 3.5–5.2)
Alkaline Phosphatase: 57 U/L (ref 39–117)
Bilirubin, Direct: 0.1 mg/dL (ref 0.0–0.3)
Total Bilirubin: 0.6 mg/dL (ref 0.2–1.2)
Total Protein: 7.5 g/dL (ref 6.0–8.3)

## 2022-05-16 LAB — HEMOGLOBIN A1C: Hgb A1c MFr Bld: 5.7 % (ref 4.6–6.5)

## 2022-05-22 ENCOUNTER — Encounter: Payer: Self-pay | Admitting: Internal Medicine

## 2022-05-22 ENCOUNTER — Ambulatory Visit (INDEPENDENT_AMBULATORY_CARE_PROVIDER_SITE_OTHER): Payer: Medicare Other | Admitting: Internal Medicine

## 2022-05-22 VITALS — BP 132/82 | HR 66 | Temp 98.1°F | Resp 17 | Ht 64.0 in | Wt 155.4 lb

## 2022-05-22 DIAGNOSIS — E049 Nontoxic goiter, unspecified: Secondary | ICD-10-CM | POA: Diagnosis not present

## 2022-05-22 DIAGNOSIS — F439 Reaction to severe stress, unspecified: Secondary | ICD-10-CM | POA: Diagnosis not present

## 2022-05-22 DIAGNOSIS — M25473 Effusion, unspecified ankle: Secondary | ICD-10-CM | POA: Diagnosis not present

## 2022-05-22 DIAGNOSIS — I479 Paroxysmal tachycardia, unspecified: Secondary | ICD-10-CM

## 2022-05-22 DIAGNOSIS — E7800 Pure hypercholesterolemia, unspecified: Secondary | ICD-10-CM

## 2022-05-22 DIAGNOSIS — I10 Essential (primary) hypertension: Secondary | ICD-10-CM | POA: Diagnosis not present

## 2022-05-22 DIAGNOSIS — R739 Hyperglycemia, unspecified: Secondary | ICD-10-CM

## 2022-05-22 DIAGNOSIS — N1831 Chronic kidney disease, stage 3a: Secondary | ICD-10-CM | POA: Diagnosis not present

## 2022-05-22 DIAGNOSIS — L409 Psoriasis, unspecified: Secondary | ICD-10-CM

## 2022-05-22 MED ORDER — FLUOCINONIDE 0.05 % EX SOLN: 1.0000 | Freq: Two times a day (BID) | CUTANEOUS | 0 refills | Status: AC

## 2022-05-22 MED ORDER — VALSARTAN-HYDROCHLOROTHIAZIDE 160-12.5 MG PO TABS: 1.0000 | ORAL_TABLET | Freq: Every day | ORAL | 2 refills | Status: DC

## 2022-05-22 NOTE — Patient Instructions (Addendum)
Stop losartan/hctz  Start valsartan/hctz 160/12.5 - take one per day.    Decrease potassium to one per day

## 2022-05-22 NOTE — Progress Notes (Signed)
Patient ID: Cheyenne Baker, female   DOB: 21-Jan-1951, 71 y.o.   MRN: [id number]   Subjective:    Patient ID: Cheyenne Baker, female    DOB: [birth year], 71 y.o.   MRN: [id number]   Patient here for  Chief Complaint  Patient presents with   Follow-up   Hypertension   .   HPI Here to follow up regarding hypercholesterolemia and hypertension.  She is s/p right thyroid lobectomy (9/1/123) for enlarging right thyroid nodule.  Pathology - benign.  Recommended f/u - Dr Gabriel Carina - lab recheck.  Did well with surgery.  No chest pain or sob reported.  Did report some swelling - ankles - noticed over the last month.  Worse in evening.  Also reports increased problems with psoriasis - scalp.  Request refill fluocinonide.  No abdominal pain reported.  Has noticed some issues taking potassium.  Wanted to know if had to stay on bid dosing.   Increased stress - mother's health issues.     Past Medical History:  Diagnosis Date   Allergy    Diverticulosis    Enlarged thyroid    Hyperlipidemia    Hypertension    Paroxysmal tachycardia ([place])    Right carotid bruit    Past Surgical History:  Procedure Laterality Date   ABDOMINAL HYSTERECTOMY  1980   ovaries not removed, secondary to bleeding   [place]   Family History  Problem Relation Age of Onset   Hypertension Mother    Hyperlipidemia Mother    Heart disease Father    Hypertension Father    Breast cancer Daughter 25   Colon cancer Neg Hx    Colon polyps Neg Hx    Rectal cancer Neg Hx    Esophageal cancer Neg Hx    Stomach cancer Neg Hx    Social History   Socioeconomic History   Marital status: Widowed    Spouse name: Not on file   Number of children: Not on file   Years of education: Not on file   Highest education level: Not on file  Occupational History   Not on file  Tobacco Use   Smoking status: Former    Packs/day: 0.50    Years: 20.00    Total pack  years: 10.00    Types: Cigarettes   Smokeless tobacco: Never  Vaping Use   Vaping Use: Never used  Substance and Sexual Activity   Alcohol use: Yes    Alcohol/week: 0.0 standard drinks of alcohol    Comment: occasionally   Drug use: No   Sexual activity: Yes  Other Topics Concern   Not on file  Social History Narrative   Not on file   Social Determinants of Health   Financial Resource Strain: Low Risk  (07/14/2021)   Overall Financial Resource Strain (CARDIA)    Difficulty of Paying Living Expenses: Not hard at all  Food Insecurity: No Food Insecurity (07/14/2021)   Hunger Vital Sign    Worried About Running Out of Food in the Last Year: Never true    Ran Out of Food in the Last Year: Never true  Transportation Needs: No Transportation Needs (07/14/2021)   PRAPARE - Hydrologist (Medical): No    Lack of Transportation (Non-Medical): No  Physical Activity: Unknown (07/13/2019)   Exercise Vital Sign    Days of Exercise per Week: 0 days  Minutes of Exercise per Session: Not on file  Stress: No Stress Concern Present (07/14/2021)   [place]    Feeling of Stress : Not at all  Social Connections: Unknown (07/14/2021)   Social Connection and Isolation Panel [NHANES]    Frequency of Communication with Friends and Family: More than three times a week    Frequency of Social Gatherings with Friends and Family: More than three times a week    Attends Religious Services: Not on file    Active Member of Clubs or Organizations: Not on file    Attends Archivist Meetings: Not on file    Marital Status: Widowed     Review of Systems  Constitutional:  Negative for appetite change and unexpected weight change.  HENT:  Negative for congestion and sinus pressure.   Respiratory:  Negative for cough, chest tightness and shortness of breath.   Cardiovascular:  Negative for chest pain and  palpitations.       Some ankle swelling as outlined.   Gastrointestinal:  Negative for abdominal pain, diarrhea, nausea and vomiting.  Genitourinary:  Negative for difficulty urinating and dysuria.  Musculoskeletal:  Negative for joint swelling and myalgias.  Skin:  Negative for color change and rash.  Neurological:  Negative for dizziness, light-headedness and headaches.  Psychiatric/Behavioral:  Negative for agitation and dysphoric mood.        Objective:     BP 132/82 (BP Location: Left Arm, Patient Position: Sitting, Cuff Size: Small)   Pulse 66   Temp 98.1 F (36.7 C) (Temporal)   Resp 17   Ht _0  (1.626 m)   Wt 155 lb 6.4 oz (70.5 kg)   SpO2 97%   BMI 26.67 kg/m  Wt Readings from Last 3 Encounters:  05/22/22 155 lb 6.4 oz (70.5 kg)  01/16/22 147 lb 4.8 oz (66.8 kg)  10/16/21 152 lb (68.9 kg)    Physical Exam Vitals reviewed.  Constitutional:      General: She is not in acute distress.    Appearance: Normal appearance.  HENT:     Head: Normocephalic and atraumatic.     Right Ear: External ear normal.     Left Ear: External ear normal.  Eyes:     General: No scleral icterus.       Right eye: No discharge.        Left eye: No discharge.     Conjunctiva/sclera: Conjunctivae normal.  Neck:     Thyroid: No thyromegaly.  Cardiovascular:     Rate and Rhythm: Normal rate and regular rhythm.  Pulmonary:     Effort: No respiratory distress.     Breath sounds: Normal breath sounds. No wheezing.  Abdominal:     General: Bowel sounds are normal.     Palpations: Abdomen is soft.     Tenderness: There is no abdominal tenderness.  Musculoskeletal:        General: No swelling or tenderness.     Cervical back: Neck supple. No tenderness.  Lymphadenopathy:     Cervical: No cervical adenopathy.  Skin:    Findings: No erythema or rash.  Neurological:     Mental Status: She is alert.  Psychiatric:        Mood and Affect: Mood normal.        Behavior: Behavior  normal.      Outpatient Encounter Medications as of 05/22/2022  Medication Sig   amLODipine (NORVASC) 10 MG tablet  Take 1 tablet (10 mg total) by mouth daily.   aspirin EC 81 MG tablet Take 81 mg by mouth daily.   atenolol (TENORMIN) 50 MG tablet TAKE ONE TABLET BY MOUTH DAILY   fluocinonide (LIDEX) 0.05 % external solution Apply 1 Application topically 2 (two) times daily.   potassium chloride (KLOR-CON) 10 MEQ tablet TAKE 1 TABLET BY MOUTH TWICE A DAY   rosuvastatin (CRESTOR) 20 MG tablet TAKE ONE TABLET BY MOUTH DAILY   valsartan-hydrochlorothiazide (DIOVAN-HCT) 160-12.5 MG tablet Take 1 tablet by mouth daily.   [DISCONTINUED] cetirizine (ZYRTEC ALLERGY) 10 MG tablet Take 1 tablet (10 mg total) by mouth daily.   [DISCONTINUED] losartan-hydrochlorothiazide (HYZAAR) 100-12.5 MG tablet Take 1 tablet by mouth daily.   No facility-administered encounter medications on file as of 05/22/2022.     Lab Results  Component Value Date   WBC 10.5 10/16/2021   HGB 13.3 10/16/2021   HCT 39.0 10/16/2021   PLT 312.0 10/16/2021   GLUCOSE 92 05/16/2022   CHOL 170 05/16/2022   TRIG 118.0 05/16/2022   HDL 62.60 05/16/2022   LDLDIRECT 143.3 06/11/2013   LDLCALC 83 05/16/2022   ALT 18 05/16/2022   AST 21 05/16/2022   NA 140 05/16/2022   K 4.3 05/16/2022   CL 102 05/16/2022   CREATININE 1.05 05/16/2022   BUN 18 05/16/2022   CO2 31 05/16/2022   TSH 1.05 07/25/2021   HGBA1C 5.7 05/16/2022    MM 3D SCREEN BREAST BILATERAL  Result Date: 08/03/2021 CLINICAL DATA:  Screening. EXAM: DIGITAL SCREENING BILATERAL MAMMOGRAM WITH TOMOSYNTHESIS AND CAD TECHNIQUE: Bilateral screening digital craniocaudal and mediolateral oblique mammograms were obtained. Bilateral screening digital breast tomosynthesis was performed. The images were evaluated with computer-aided detection. COMPARISON:  Previous exam(s). ACR Breast Density Category c: The breast tissue is heterogeneously dense, which may obscure small  masses. FINDINGS: There are no findings suspicious for malignancy. IMPRESSION: No mammographic evidence of malignancy. A result letter of this screening mammogram will be mailed directly to the patient. RECOMMENDATION: Screening mammogram in one year. (Code:SM-B-01Y) BI-RADS CATEGORY  1: Negative. Electronically Signed   By: Audie Pinto M.D.   On: 08/03/2021 13:47      Assessment & Plan:   Problem List Items Addressed This Visit     Ankle swelling    No significant swelling on exam.  States is worse in pm.  Leg elevation.  Compression hose.  Avoid increased salt intake.  Doubt amlodipine - just started.  Follow.        CKD (chronic kidney disease), stage III (HCC)    Avoid antiinflammatories.  Stay hydrated.  On losartan. Had discussed renal ultrasound.  Wanted to hold. Recent GFR 52.  Follow metabolic panel.       Enlarged thyroid    She is s/p right thyroid lobectomy (9/1/123) for enlarging right thyroid nodule.  Pathology - benign.  Recommended f/u - Dr Gabriel Carina - lab recheck.       Essential hypertension - Primary    Blood pressure as outlined.  Continue losartan/hctz, atenolol and amlodipine.  Follow pressures.  Hold on changing medications.  Follow metabolic panel.       Relevant Medications   valsartan-hydrochlorothiazide (DIOVAN-HCT) 160-12.5 MG tablet   Other Relevant Orders   Basic Metabolic Panel (BMET)   Hypercholesterolemia    On crestor.  Low cholesterol diet and exercise.  Follow lipid panel and liver function tests.   Lab Results  Component Value Date   CHOL 170 05/16/2022  HDL 62.60 05/16/2022   LDLCALC 83 05/16/2022   LDLDIRECT 143.3 06/11/2013   TRIG 118.0 05/16/2022   CHOLHDL 3 05/16/2022       Relevant Medications   valsartan-hydrochlorothiazide (DIOVAN-HCT) 160-12.5 MG tablet   Other Relevant Orders   Hepatic function panel   Lipid Profile   Hyperglycemia    Low carb diet and exercise.  Follow met b and a1c.       Relevant Orders   HgB A1c    Paroxysmal tachycardia (HCC)    On atenolol.  No increased heart rate or palpitations.  Follow.        Relevant Medications   valsartan-hydrochlorothiazide (DIOVAN-HCT) 160-12.5 MG tablet   Psoriasis    Scalp.  Refilled fluocinonide.          Stress    Increased stress as outlined.  Overall appears to be handling things relatively well.  Follow.         Einar Pheasant, MD

## 2022-05-27 ENCOUNTER — Encounter: Payer: Self-pay | Admitting: Internal Medicine

## 2022-05-27 DIAGNOSIS — L409 Psoriasis, unspecified: Secondary | ICD-10-CM | POA: Insufficient documentation

## 2022-05-27 DIAGNOSIS — M25473 Effusion, unspecified ankle: Secondary | ICD-10-CM | POA: Insufficient documentation

## 2022-05-27 NOTE — Assessment & Plan Note (Signed)
Increased stress as outlined.  Overall appears to be handling things relatively well.  Follow.  

## 2022-05-27 NOTE — Assessment & Plan Note (Signed)
Avoid antiinflammatories.  Stay hydrated.  On losartan. Had discussed renal ultrasound.  Wanted to hold. Recent GFR 52.  Follow metabolic panel.

## 2022-05-27 NOTE — Assessment & Plan Note (Signed)
No significant swelling on exam.  States is worse in pm.  Leg elevation.  Compression hose.  Avoid increased salt intake.  Doubt amlodipine - just started.  Follow.

## 2022-05-27 NOTE — Assessment & Plan Note (Signed)
She is s/p right thyroid lobectomy (9/1/123) for enlarging right thyroid nodule.  Pathology - benign.  Recommended f/u - Dr Gabriel Carina - lab recheck.

## 2022-05-27 NOTE — Assessment & Plan Note (Signed)
Low carb diet and exercise.  Follow met b and a1c.  

## 2022-05-27 NOTE — Assessment & Plan Note (Signed)
On crestor.  Low cholesterol diet and exercise.  Follow lipid panel and liver function tests.   Lab Results  Component Value Date   CHOL 170 05/16/2022   HDL 62.60 05/16/2022   LDLCALC 83 05/16/2022   LDLDIRECT 143.3 06/11/2013   TRIG 118.0 05/16/2022   CHOLHDL 3 05/16/2022

## 2022-05-27 NOTE — Assessment & Plan Note (Signed)
Blood pressure as outlined.  Continue losartan/hctz, atenolol and amlodipine.  Follow pressures.  Hold on changing medications.  Follow metabolic panel.

## 2022-05-27 NOTE — Assessment & Plan Note (Signed)
Scalp.  Refilled fluocinonide.

## 2022-05-27 NOTE — Assessment & Plan Note (Signed)
On atenolol.  No increased heart rate or palpitations.  Follow.

## 2022-05-28 ENCOUNTER — Other Ambulatory Visit: Payer: Self-pay | Admitting: Internal Medicine

## 2022-05-30 DIAGNOSIS — E041 Nontoxic single thyroid nodule: Secondary | ICD-10-CM | POA: Diagnosis not present

## 2022-06-06 DIAGNOSIS — Z9889 Other specified postprocedural states: Secondary | ICD-10-CM | POA: Diagnosis not present

## 2022-06-06 DIAGNOSIS — Z8639 Personal history of other endocrine, nutritional and metabolic disease: Secondary | ICD-10-CM | POA: Diagnosis not present

## 2022-06-19 ENCOUNTER — Other Ambulatory Visit: Payer: Medicare Other

## 2022-06-19 ENCOUNTER — Other Ambulatory Visit (INDEPENDENT_AMBULATORY_CARE_PROVIDER_SITE_OTHER): Payer: Medicare Other

## 2022-06-19 DIAGNOSIS — I10 Essential (primary) hypertension: Secondary | ICD-10-CM | POA: Diagnosis not present

## 2022-06-19 DIAGNOSIS — R739 Hyperglycemia, unspecified: Secondary | ICD-10-CM

## 2022-06-19 DIAGNOSIS — E7800 Pure hypercholesterolemia, unspecified: Secondary | ICD-10-CM

## 2022-06-19 LAB — HEPATIC FUNCTION PANEL
ALT: 16 U/L (ref 0–35)
AST: 22 U/L (ref 0–37)
Albumin: 4.3 g/dL (ref 3.5–5.2)
Alkaline Phosphatase: 48 U/L (ref 39–117)
Bilirubin, Direct: 0.1 mg/dL (ref 0.0–0.3)
Total Bilirubin: 0.5 mg/dL (ref 0.2–1.2)
Total Protein: 7.3 g/dL (ref 6.0–8.3)

## 2022-06-19 LAB — LIPID PANEL
Cholesterol: 164 mg/dL (ref 0–200)
HDL: 64.8 mg/dL (ref >39.00)
LDL Cholesterol: 79 mg/dL (ref 0–99)
NonHDL: 99.61
Total CHOL/HDL Ratio: 3
Triglycerides: 104 mg/dL (ref 0.0–149.0)
VLDL: 20.8 mg/dL (ref 0.0–40.0)

## 2022-06-19 LAB — BASIC METABOLIC PANEL
BUN: 15 mg/dL (ref 6–23)
CO2: 30 mEq/L (ref 19–32)
Calcium: 9.9 mg/dL (ref 8.4–10.5)
Chloride: 102 mEq/L (ref 96–112)
Creatinine, Ser: 0.95 mg/dL (ref 0.40–1.20)
GFR: 59.3 mL/min — ABNORMAL LOW (ref >60.00)
Glucose, Bld: 83 mg/dL (ref 70–99)
Potassium: 3.7 mEq/L (ref 3.5–5.1)
Sodium: 140 mEq/L (ref 135–145)

## 2022-06-19 LAB — HEMOGLOBIN A1C: Hgb A1c MFr Bld: 5.8 % (ref 4.6–6.5)

## 2022-06-25 HISTORY — PX: THYROID SURGERY: SHX805

## 2022-06-29 DIAGNOSIS — H35033 Hypertensive retinopathy, bilateral: Secondary | ICD-10-CM | POA: Diagnosis not present

## 2022-06-29 DIAGNOSIS — H5203 Hypermetropia, bilateral: Secondary | ICD-10-CM | POA: Diagnosis not present

## 2022-06-29 DIAGNOSIS — H524 Presbyopia: Secondary | ICD-10-CM | POA: Diagnosis not present

## 2022-06-29 DIAGNOSIS — H2513 Age-related nuclear cataract, bilateral: Secondary | ICD-10-CM | POA: Diagnosis not present

## 2022-06-29 DIAGNOSIS — H52223 Regular astigmatism, bilateral: Secondary | ICD-10-CM | POA: Diagnosis not present

## 2022-07-08 ENCOUNTER — Other Ambulatory Visit: Payer: Self-pay | Admitting: Internal Medicine

## 2022-07-13 ENCOUNTER — Ambulatory Visit (INDEPENDENT_AMBULATORY_CARE_PROVIDER_SITE_OTHER): Payer: Medicare Other | Admitting: Internal Medicine

## 2022-07-13 ENCOUNTER — Encounter: Payer: Self-pay | Admitting: Internal Medicine

## 2022-07-13 VITALS — BP 134/80 | HR 70 | Temp 98.0°F | Resp 16 | Ht 64.0 in | Wt 155.2 lb

## 2022-07-13 DIAGNOSIS — Z1231 Encounter for screening mammogram for malignant neoplasm of breast: Secondary | ICD-10-CM | POA: Diagnosis not present

## 2022-07-13 DIAGNOSIS — Z9109 Other allergy status, other than to drugs and biological substances: Secondary | ICD-10-CM | POA: Diagnosis not present

## 2022-07-13 DIAGNOSIS — F439 Reaction to severe stress, unspecified: Secondary | ICD-10-CM

## 2022-07-13 DIAGNOSIS — E049 Nontoxic goiter, unspecified: Secondary | ICD-10-CM

## 2022-07-13 DIAGNOSIS — I479 Paroxysmal tachycardia, unspecified: Secondary | ICD-10-CM | POA: Diagnosis not present

## 2022-07-13 DIAGNOSIS — M25473 Effusion, unspecified ankle: Secondary | ICD-10-CM

## 2022-07-13 DIAGNOSIS — N1831 Chronic kidney disease, stage 3a: Secondary | ICD-10-CM | POA: Diagnosis not present

## 2022-07-13 DIAGNOSIS — R739 Hyperglycemia, unspecified: Secondary | ICD-10-CM | POA: Diagnosis not present

## 2022-07-13 DIAGNOSIS — I10 Essential (primary) hypertension: Secondary | ICD-10-CM

## 2022-07-13 DIAGNOSIS — E7800 Pure hypercholesterolemia, unspecified: Secondary | ICD-10-CM

## 2022-07-13 MED ORDER — AZELASTINE HCL 0.1 % NA SOLN: 1.0000 | Freq: Two times a day (BID) | NASAL | 1 refills | Status: DC

## 2022-07-13 MED ORDER — POTASSIUM CHLORIDE ER 10 MEQ PO TBCR: 10.0000 meq | EXTENDED_RELEASE_TABLET | Freq: Every day | ORAL | 0 refills | Status: DC

## 2022-07-13 NOTE — Progress Notes (Signed)
Patient ID: Cheyenne Baker, female   DOB: 1950-06-27, 72 y.o.   MRN: [id number]   Subjective:    Patient ID: Cheyenne Baker, female    DOB: 03/20/1951, 72 y.o.   MRN: [id number]   Patient here for  Chief Complaint  Patient presents with   Medical Management of Chronic Issues   .   HPI Here to follow up regarding hypercholesterolemia and hypertension.  She is s/p right thyroid lobectomy (9/1/123) for enlarging right thyroid nodule.  Pathology - benign.  Recommended f/u - Dr Gabriel Carina.  Last seen 06/06/22 - stable.  No scheduled f/u recommended.  No chest pain or sob reported.  Last visit reported some swelling - ankles.  Recommended compression hose.  She reports this is better.  No significant swelling now. No abdominal pain reported.    Overall handling stress well.  Does report dripping nose. Steroid nasal spray has not helped.  Discussed trial of astelin.    Past Medical History:  Diagnosis Date   Allergy    Diverticulosis    Enlarged thyroid    Hyperlipidemia    Hypertension    Paroxysmal tachycardia ([place])    Right carotid bruit    Past Surgical History:  Procedure Laterality Date   ABDOMINAL HYSTERECTOMY  1980   ovaries not removed, secondary to bleeding   [place]   Family History  Problem Relation Age of Onset   Hypertension Mother    Hyperlipidemia Mother    Heart disease Father    Hypertension Father    Breast cancer Daughter 46   Colon cancer Neg Hx    Colon polyps Neg Hx    Rectal cancer Neg Hx    Esophageal cancer Neg Hx    Stomach cancer Neg Hx    Social History   Socioeconomic History   Marital status: Widowed    Spouse name: Not on file   Number of children: Not on file   Years of education: Not on file   Highest education level: Not on file  Occupational History   Not on file  Tobacco Use   Smoking status: Former    Packs/day: 0.50    Years: 20.00    Total pack years: 10.00     Types: Cigarettes   Smokeless tobacco: Never  Vaping Use   Vaping Use: Never used  Substance and Sexual Activity   Alcohol use: Yes    Alcohol/week: 0.0 standard drinks of alcohol    Comment: occasionally   Drug use: No   Sexual activity: Yes  Other Topics Concern   Not on file  Social History Narrative   Not on file   Social Determinants of Health   Financial Resource Strain: Low Risk  (07/14/2021)   Overall Financial Resource Strain (CARDIA)    Difficulty of Paying Living Expenses: Not hard at all  Food Insecurity: No Food Insecurity (07/14/2021)   Hunger Vital Sign    Worried About Running Out of Food in the Last Year: Never true    Ran Out of Food in the Last Year: Never true  Transportation Needs: No Transportation Needs (07/14/2021)   PRAPARE - Hydrologist (Medical): No    Lack of Transportation (Non-Medical): No  Physical Activity: Unknown (07/13/2019)   Exercise Vital Sign    Days of Exercise per Week: 0 days    Minutes of Exercise per Session: Not on file  Stress: No Stress Concern Present (07/14/2021)   [place]    Feeling of Stress : Not at all  Social Connections: Unknown (07/14/2021)   Social Connection and Isolation Panel [NHANES]    Frequency of Communication with Friends and Family: More than three times a week    Frequency of Social Gatherings with Friends and Family: More than three times a week    Attends Religious Services: Not on file    Active Member of Clubs or Organizations: Not on file    Attends Archivist Meetings: Not on file    Marital Status: Widowed     Review of Systems  Constitutional:  Negative for appetite change and unexpected weight change.  HENT:  Negative for congestion and sinus pressure.        Some dripping - nose.   Respiratory:  Negative for cough, chest tightness and shortness of breath.   Cardiovascular:  Negative for  chest pain, palpitations and leg swelling.  Gastrointestinal:  Negative for abdominal pain, diarrhea, nausea and vomiting.  Genitourinary:  Negative for difficulty urinating and dysuria.  Musculoskeletal:  Negative for joint swelling and myalgias.  Skin:  Negative for color change and rash.  Neurological:  Negative for dizziness, light-headedness and headaches.  Psychiatric/Behavioral:  Negative for agitation and dysphoric mood.        Objective:     BP 134/80   Pulse 70   Temp 98 F (36.7 C)   Resp 16   Ht '5\' 4"'$  (1.626 m)   Wt 155 lb 3.2 oz (70.4 kg)   SpO2 98%   BMI 26.64 kg/m  Wt Readings from Last 3 Encounters:  07/13/22 155 lb 3.2 oz (70.4 kg)  05/22/22 155 lb 6.4 oz (70.5 kg)  01/16/22 147 lb 4.8 oz (66.8 kg)    Physical Exam Vitals reviewed.  Constitutional:      General: She is not in acute distress.    Appearance: Normal appearance.  HENT:     Head: Normocephalic and atraumatic.     Right Ear: External ear normal.     Left Ear: External ear normal.  Eyes:     General: No scleral icterus.       Right eye: No discharge.        Left eye: No discharge.     Conjunctiva/sclera: Conjunctivae normal.  Neck:     Thyroid: No thyromegaly.  Cardiovascular:     Rate and Rhythm: Normal rate and regular rhythm.  Pulmonary:     Effort: No respiratory distress.     Breath sounds: Normal breath sounds. No wheezing.  Abdominal:     General: Bowel sounds are normal.     Palpations: Abdomen is soft.     Tenderness: There is no abdominal tenderness.  Musculoskeletal:        General: No swelling or tenderness.     Cervical back: Neck supple. No tenderness.  Lymphadenopathy:     Cervical: No cervical adenopathy.  Skin:    Findings: No erythema or rash.  Neurological:     Mental Status: She is alert.  Psychiatric:        Mood and Affect: Mood normal.        Behavior: Behavior normal.      Outpatient Encounter Medications as of 07/13/2022  Medication Sig    azelastine (ASTELIN) 0.1 % nasal spray Place 1 spray into both nostrils 2 (two) times daily. Use in each nostril as directed  amLODipine (NORVASC) 10 MG tablet Take 1 tablet (10 mg total) by mouth daily.   aspirin EC 81 MG tablet Take 81 mg by mouth daily.   atenolol (TENORMIN) 50 MG tablet TAKE 1 TABLET BY MOUTH DAILY   fluocinonide (LIDEX) 0.05 % external solution Apply 1 Application topically 2 (two) times daily.   potassium chloride (KLOR-CON) 10 MEQ tablet Take 1 tablet (10 mEq total) by mouth daily.   rosuvastatin (CRESTOR) 20 MG tablet TAKE ONE TABLET BY MOUTH DAILY   valsartan-hydrochlorothiazide (DIOVAN-HCT) 160-12.5 MG tablet Take 1 tablet by mouth daily.   [DISCONTINUED] potassium chloride (KLOR-CON) 10 MEQ tablet TAKE 1 TABLET BY MOUTH TWICE A DAY (Patient taking differently: Take 10 mEq by mouth daily.)   No facility-administered encounter medications on file as of 07/13/2022.     Lab Results  Component Value Date   WBC 10.5 10/16/2021   HGB 13.3 10/16/2021   HCT 39.0 10/16/2021   PLT 312.0 10/16/2021   GLUCOSE 83 06/19/2022   CHOL 164 06/19/2022   TRIG 104.0 06/19/2022   HDL 64.80 06/19/2022   LDLDIRECT 143.3 06/11/2013   LDLCALC 79 06/19/2022   ALT 16 06/19/2022   AST 22 06/19/2022   NA 140 06/19/2022   K 3.7 06/19/2022   CL 102 06/19/2022   CREATININE 0.95 06/19/2022   BUN 15 06/19/2022   CO2 30 06/19/2022   TSH 1.05 07/25/2021   HGBA1C 5.8 06/19/2022    MM 3D SCREEN BREAST BILATERAL  Result Date: 08/03/2021 CLINICAL DATA:  Screening. EXAM: DIGITAL SCREENING BILATERAL MAMMOGRAM WITH TOMOSYNTHESIS AND CAD TECHNIQUE: Bilateral screening digital craniocaudal and mediolateral oblique mammograms were obtained. Bilateral screening digital breast tomosynthesis was performed. The images were evaluated with computer-aided detection. COMPARISON:  Previous exam(s). ACR Breast Density Category c: The breast tissue is heterogeneously dense, which may obscure small masses.  FINDINGS: There are no findings suspicious for malignancy. IMPRESSION: No mammographic evidence of malignancy. A result letter of this screening mammogram will be mailed directly to the patient. RECOMMENDATION: Screening mammogram in one year. (Code:SM-B-01Y) BI-RADS CATEGORY  1: Negative. Electronically Signed   By: Audie Pinto M.D.   On: 08/03/2021 13:47      Assessment & Plan:   Problem List Items Addressed This Visit     Stress    Overall appears to be handling things relatively well.  Follow.       Paroxysmal tachycardia (HCC)    On atenolol.  No increased heart rate or palpitations.  Follow.        Hyperglycemia    Low carb diet and exercise.  Follow met b and a1c.       Relevant Orders   Hemoglobin A1c   Hypercholesterolemia    On crestor.  Low cholesterol diet and exercise.  Follow lipid panel and liver function tests.   Lab Results  Component Value Date   CHOL 164 06/19/2022   HDL 64.80 06/19/2022   LDLCALC 79 06/19/2022   LDLDIRECT 143.3 06/11/2013   TRIG 104.0 06/19/2022   CHOLHDL 3 06/19/2022       Relevant Orders   Lipid panel   Hepatic function panel   Essential hypertension - Primary    Blood pressure as outlined.  Continue losartan/hctz, atenolol and amlodipine.  Follow pressures.  Hold on changing medications.  Follow metabolic panel.       Relevant Orders   Basic metabolic panel   Environmental allergies    Has zyrtec.  Reports persistent dripping of nose.  Steroid  nasal spray not helping.  Trial of astelin nasal spray.  Follow.       Enlarged thyroid    She is s/p right thyroid lobectomy (9/1/123) for enlarging right thyroid nodule.  Pathology - benign.  Recommended f/u - Dr Gabriel Carina - lab recheck. .  Last seen 06/06/22 - stable.      CKD (chronic kidney disease), stage III (HCC)    Avoid antiinflammatories.  Stay hydrated.  On losartan. Had discussed renal ultrasound.  Wanted to hold. Recent GFR improved - 59.  Follow metabolic panel.        Ankle swelling    Not an issue now.  Follow.       Other Visit Diagnoses     Visit for screening mammogram       Relevant Orders   MM 3D SCREEN BREAST BILATERAL       Einar Pheasant, MD

## 2022-07-22 ENCOUNTER — Encounter: Payer: Self-pay | Admitting: Internal Medicine

## 2022-07-22 NOTE — Assessment & Plan Note (Signed)
Low carb diet and exercise.  Follow met b and a1c.  

## 2022-07-22 NOTE — Assessment & Plan Note (Signed)
Not an issue now.  Follow.    

## 2022-07-22 NOTE — Assessment & Plan Note (Signed)
Has zyrtec.  Reports persistent dripping of nose.  Steroid nasal spray not helping.  Trial of astelin nasal spray.  Follow.

## 2022-07-22 NOTE — Assessment & Plan Note (Signed)
On crestor.  Low cholesterol diet and exercise.  Follow lipid panel and liver function tests.   Lab Results  Component Value Date   CHOL 164 06/19/2022   HDL 64.80 06/19/2022   LDLCALC 79 06/19/2022   LDLDIRECT 143.3 06/11/2013   TRIG 104.0 06/19/2022   CHOLHDL 3 06/19/2022

## 2022-07-22 NOTE — Assessment & Plan Note (Signed)
Overall appears to be handling things relatively well.  Follow.   

## 2022-07-22 NOTE — Assessment & Plan Note (Signed)
On atenolol.  No increased heart rate or palpitations.  Follow.

## 2022-07-22 NOTE — Assessment & Plan Note (Signed)
Avoid antiinflammatories.  Stay hydrated.  On losartan. Had discussed renal ultrasound.  Wanted to hold. Recent GFR improved - 59.  Follow metabolic panel.

## 2022-07-22 NOTE — Assessment & Plan Note (Signed)
Blood pressure as outlined.  Continue losartan/hctz, atenolol and amlodipine.  Follow pressures.  Hold on changing medications.  Follow metabolic panel.

## 2022-07-22 NOTE — Assessment & Plan Note (Signed)
She is s/p right thyroid lobectomy (9/1/123) for enlarging right thyroid nodule.  Pathology - benign.  Recommended f/u - Dr Gabriel Carina - lab recheck. .  Last seen 06/06/22 - stable.

## 2022-07-26 ENCOUNTER — Ambulatory Visit (INDEPENDENT_AMBULATORY_CARE_PROVIDER_SITE_OTHER): Payer: Medicare Other

## 2022-07-26 VITALS — Ht 64.0 in | Wt 155.0 lb

## 2022-07-26 DIAGNOSIS — Z0000 Encounter for general adult medical examination without abnormal findings: Secondary | ICD-10-CM | POA: Diagnosis not present

## 2022-07-26 NOTE — Patient Instructions (Addendum)
Cheyenne Baker , Thank you for taking time to come for your Medicare Wellness Visit. I appreciate your ongoing commitment to your health goals. Please review the following plan we discussed and let me know if I can assist you in the future.   These are the goals we discussed:  Goals       Patient Stated     Maintain Healthy Lifestyle (pt-stated)      Walk for exercise Stay hydrated  Healthy diet        This is a list of the screening recommended for you and due dates:  Health Maintenance  Topic Date Due   COVID-19 Vaccine (7 - 2023-24 season) 07/29/2022*   Mammogram  08/03/2022   Medicare Annual Wellness Visit  07/27/2023   DTaP/Tdap/Td vaccine (2 - Td or Tdap) 09/02/2031   Colon Cancer Screening  09/22/2031   Pneumonia Vaccine  Completed   Flu Shot  Completed   DEXA scan (bone density measurement)  Completed   Hepatitis C Screening: USPSTF Recommendation to screen - Ages 77-79 yo.  Completed   Zoster (Shingles) Vaccine  Completed   HPV Vaccine  Aged Out  *Topic was postponed. The date shown is not the original due date.    Advanced directives: End of life planning; Advance aging; Advanced directives discussed.  Copy of current HCPOA/Living Will requested.    Conditions/risks identified: none new.  Next appointment: Follow up in one year for your annual wellness visit    Preventive Care 65 Years and Older, Female Preventive care refers to lifestyle choices and visits with your health care provider that can promote health and wellness. What does preventive care include? A yearly physical exam. This is also called an annual well check. Dental exams once or twice a year. Routine eye exams. Ask your health care provider how often you should have your eyes checked. Personal lifestyle choices, including: Daily care of your teeth and gums. Regular physical activity. Eating a healthy diet. Avoiding tobacco and drug use. Limiting alcohol use. Practicing safe sex. Taking low-dose  aspirin every day. Taking vitamin and mineral supplements as recommended by your health care provider. What happens during an annual well check? The services and screenings done by your health care provider during your annual well check will depend on your age, overall health, lifestyle risk factors, and family history of disease. Counseling  Your health care provider may ask you questions about your: Alcohol use. Tobacco use. Drug use. Emotional well-being. Home and relationship well-being. Sexual activity. Eating habits. History of falls. Memory and ability to understand (cognition). Work and work Statistician. Reproductive health. Screening  You may have the following tests or measurements: Height, weight, and BMI. Blood pressure. Lipid and cholesterol levels. These may be checked every 5 years, or more frequently if you are over 74 years old. Skin check. Lung cancer screening. You may have this screening every year starting at age 49 if you have a 30-pack-year history of smoking and currently smoke or have quit within the past 15 years. Fecal occult blood test (FOBT) of the stool. You may have this test every year starting at age 34. Flexible sigmoidoscopy or colonoscopy. You may have a sigmoidoscopy every 5 years or a colonoscopy every 10 years starting at age 41. Hepatitis C blood test. Hepatitis B blood test. Sexually transmitted disease (STD) testing. Diabetes screening. This is done by checking your blood sugar (glucose) after you have not eaten for a while (fasting). You may have this done every  1-3 years. Bone density scan. This is done to screen for osteoporosis. You may have this done starting at age 74. Mammogram. This may be done every 1-2 years. Talk to your health care provider about how often you should have regular mammograms. Talk with your health care provider about your test results, treatment options, and if necessary, the need for more tests. Vaccines  Your  health care provider may recommend certain vaccines, such as: Influenza vaccine. This is recommended every year. Tetanus, diphtheria, and acellular pertussis (Tdap, Td) vaccine. You may need a Td booster every 10 years. Zoster vaccine. You may need this after age 33. Pneumococcal 13-valent conjugate (PCV13) vaccine. One dose is recommended after age 80. Pneumococcal polysaccharide (PPSV23) vaccine. One dose is recommended after age 12. Talk to your health care provider about which screenings and vaccines you need and how often you need them. This information is not intended to replace advice given to you by your health care provider. Make sure you discuss any questions you have with your health care provider. Document Released: 07/08/2015 Document Revised: 02/29/2016 Document Reviewed: 04/12/2015 Elsevier Interactive Patient Education  2017 [place] Prevention in the Home Falls can cause injuries. They can happen to people of all ages. There are many things you can do to make your home safe and to help prevent falls. What can I do on the outside of my home? Regularly fix the edges of walkways and driveways and fix any cracks. Remove anything that might make you trip as you walk through a door, such as a raised step or threshold. Trim any bushes or trees on the path to your home. Use bright outdoor lighting. Clear any walking paths of anything that might make someone trip, such as rocks or tools. Regularly check to see if handrails are loose or broken. Make sure that both sides of any steps have handrails. Any raised decks and porches should have guardrails on the edges. Have any leaves, snow, or ice cleared regularly. Use sand or salt on walking paths during winter. Clean up any spills in your garage right away. This includes oil or grease spills. What can I do in the bathroom? Use night lights. Install grab bars by the toilet and in the tub and shower. Do not use towel bars as  grab bars. Use non-skid mats or decals in the tub or shower. If you need to sit down in the shower, use a plastic, non-slip stool. Keep the floor dry. Clean up any water that spills on the floor as soon as it happens. Remove soap buildup in the tub or shower regularly. Attach bath mats securely with double-sided non-slip rug tape. Do not have throw rugs and other things on the floor that can make you trip. What can I do in the bedroom? Use night lights. Make sure that you have a light by your bed that is easy to reach. Do not use any sheets or blankets that are too big for your bed. They should not hang down onto the floor. Have a firm chair that has side arms. You can use this for support while you get dressed. Do not have throw rugs and other things on the floor that can make you trip. What can I do in the kitchen? Clean up any spills right away. Avoid walking on wet floors. Keep items that you use a lot in easy-to-reach places. If you need to reach something above you, use a strong step stool that has a  grab bar. Keep electrical cords out of the way. Do not use floor polish or wax that makes floors slippery. If you must use wax, use non-skid floor wax. Do not have throw rugs and other things on the floor that can make you trip. What can I do with my stairs? Do not leave any items on the stairs. Make sure that there are handrails on both sides of the stairs and use them. Fix handrails that are broken or loose. Make sure that handrails are as long as the stairways. Check any carpeting to make sure that it is firmly attached to the stairs. Fix any carpet that is loose or worn. Avoid having throw rugs at the top or bottom of the stairs. If you do have throw rugs, attach them to the floor with carpet tape. Make sure that you have a light switch at the top of the stairs and the bottom of the stairs. If you do not have them, ask someone to add them for you. What else can I do to help prevent  falls? Wear shoes that: Do not have high heels. Have rubber bottoms. Are comfortable and fit you well. Are closed at the toe. Do not wear sandals. If you use a stepladder: Make sure that it is fully opened. Do not climb a closed stepladder. Make sure that both sides of the stepladder are locked into place. Ask someone to hold it for you, if possible. Clearly mark and make sure that you can see: Any grab bars or handrails. First and last steps. Where the edge of each step is. Use tools that help you move around (mobility aids) if they are needed. These include: Canes. Walkers. Scooters. Crutches. Turn on the lights when you go into a dark area. Replace any light bulbs as soon as they burn out. Set up your furniture so you have a clear path. Avoid moving your furniture around. If any of your floors are uneven, fix them. If there are any pets around you, be aware of where they are. Review your medicines with your doctor. Some medicines can make you feel dizzy. This can increase your chance of falling. Ask your doctor what other things that you can do to help prevent falls. This information is not intended to replace advice given to you by your health care provider. Make sure you discuss any questions you have with your health care provider. Document Released: 04/07/2009 Document Revised: 11/17/2015 Document Reviewed: 07/16/2014 Elsevier Interactive Patient Education  2017 [employer].

## 2022-07-26 NOTE — Progress Notes (Signed)
Subjective:   Cheyenne Baker is a 72 y.o. female who presents for Medicare Annual (Subsequent) preventive examination.  Review of Systems    No ROS.  Medicare Wellness Virtual Visit.  Visual/audio telehealth visit, UTA vital signs.   See social history for additional risk factors.   Cardiac Risk Factors include: advanced age (>49mn, >>69women)     Objective:    Today's Vitals   07/26/22 0959  Weight: 155 lb (70.3 kg)  Height: '5\' 4"'$  (1.626 m)   Body mass index is 26.61 kg/m.     07/26/2022    9:58 AM 07/13/2020    9:45 AM 07/13/2019    9:40 AM 07/09/2018    9:52 AM 05/02/2018   11:21 AM 04/17/2018   12:52 AM 07/04/2017   11:18 AM  Advanced Directives  Does Patient Have a Medical Advance Directive? Yes Yes Yes Yes Yes No;Yes Yes  Type of AParamedicof ACord[place]Living will H[place]Living will HStaplesLiving will HI[place]ayLiving will HNorth LibertyLiving will HKensettLiving will HBonifayLiving will  Does patient want to make changes to medical advance directive? No - Patient declined No - Patient declined No - Patient declined No - Patient declined No - Patient declined No - Patient declined No - Patient declined  Copy of H[place]ackeyin Chart? No - copy requested No - copy requested No - copy requested No - copy requested No - copy requested No - copy requested No - copy requested  Would patient like information on creating a medical advance directive?     No - Patient declined No - Patient declined     Current Medications (verified) Outpatient Encounter Medications as of 07/26/2022  Medication Sig   amLODipine (NORVASC) 10 MG tablet Take 1 tablet (10 mg total) by mouth daily.   aspirin EC 81 MG tablet Take 81 mg by mouth daily.   atenolol (TENORMIN) 50 MG tablet TAKE 1 TABLET BY MOUTH DAILY   azelastine (ASTELIN) 0.1 %  nasal spray Place 1 spray into both nostrils 2 (two) times daily. Use in each nostril as directed   fluocinonide (LIDEX) 0.05 % external solution Apply 1 Application topically 2 (two) times daily.   potassium chloride (KLOR-CON) 10 MEQ tablet Take 1 tablet (10 mEq total) by mouth daily.   rosuvastatin (CRESTOR) 20 MG tablet TAKE ONE TABLET BY MOUTH DAILY   valsartan-hydrochlorothiazide (DIOVAN-HCT) 160-12.5 MG tablet Take 1 tablet by mouth daily.   No facility-administered encounter medications on file as of 07/26/2022.    Allergies (verified) Patient has no known allergies.   History: Past Medical History:  Diagnosis Date   Allergy    Diverticulosis    Enlarged thyroid    Hyperlipidemia    Hypertension    Paroxysmal tachycardia ([place]    Right carotid bruit    Past Surgical History:  Procedure Laterality Date   ABDOMINAL HYSTERECTOMY  1980   ovaries not removed, secondary to bleeding   CR[place]co  Family History  Problem Relation Age of Onset   Hypertension Mother    Hyperlipidemia Mother    Heart disease Father    Hypertension Father    Breast cancer Daughter [id number]  Colon cancer Neg Hx    Colon polyps Neg Hx    Rectal cancer Neg Hx  Esophageal cancer Neg Hx    Stomach cancer Neg Hx    Social History   Socioeconomic History   Marital status: Widowed    Spouse name: Not on file   Number of children: Not on file   Years of education: Not on file   Highest education level: Not on file  Occupational History   Not on file  Tobacco Use   Smoking status: Former    Packs/day: 0.50    Years: 20.00    Total pack years: 10.00    Types: Cigarettes   Smokeless tobacco: Never  Vaping Use   Vaping Use: Never used  Substance and Sexual Activity   Alcohol use: Yes    Alcohol/week: 0.0 standard drinks of alcohol    Comment: occasionally   Drug use: No   Sexual activity: Yes  Other Topics Concern    Not on file  Social History Narrative   Not on file   Social Determinants of Health   Financial Resource Strain: Low Risk  (07/26/2022)   Overall Financial Resource Strain (CARDIA)    Difficulty of Paying Living Expenses: Not hard at all  Food Insecurity: No Food Insecurity (07/26/2022)   Hunger Vital Sign    Worried About Running Out of Food in the Last Year: Never true    Ran Out of Food in the Last Year: Never true  Transportation Needs: No Transportation Needs (07/26/2022)   PRAPARE - Hydrologist (Medical): No    Lack of Transportation (Non-Medical): No  Physical Activity: Sufficiently Active (07/26/2022)   Exercise Vital Sign    Days of Exercise per Week: 4 days    Minutes of Exercise per Session: 60 min  Stress: No Stress Concern Present (07/26/2022)   [place]    Feeling of Stress : Not at all  Social Connections: Unknown (07/26/2022)   Social Connection and Isolation Panel [NHANES]    Frequency of Communication with Friends and Family: More than three times a week    Frequency of Social Gatherings with Friends and Family: More than three times a week    Attends Religious Services: Not on file    Active Member of Clubs or Organizations: Yes    Attends Archivist Meetings: More than 4 times per year    Marital Status: Widowed    Tobacco Counseling Counseling given: Not Answered   Clinical Intake:              How often do you need to have someone help you when you read instructions, pamphlets, or other written materials from your doctor or pharmacy?: 1 - Never    Interpreter Needed?: No      Activities of Daily Living    07/26/2022    9:55 AM 07/22/2022    4:35 PM  In your present state of health, do you have any difficulty performing the following activities:  Hearing? 0 0  Vision? 0 0  Difficulty concentrating or making decisions? 0 0  Walking or  climbing stairs? 0 0  Dressing or bathing? 0 0  Doing errands, shopping? 0 0  Preparing Food and eating ? N N  Using the Toilet? N N  In the past six months, have you accidently leaked urine? N N  Do you have problems with loss of bowel control? N N  Managing your Medications? N N  Managing your Finances? N N  Housekeeping or managing your Housekeeping?  N N    Patient Care Team: Einar Pheasant, MD as PCP - General (Internal Medicine)  Indicate any recent Medical Services you may have received from other than [facility] providers in the past year (date may be approximate).     Assessment:   This is a routine wellness examination for [name].  I connected with  Shelda Jakes Delehanty on 07/26/22 by a audio enabled telemedicine application and verified that I am speaking with the correct person using two identifiers.  Patient Location: Home  Provider Location: Office/Clinic  I discussed the limitations of evaluation and management by telemedicine. The patient expressed understanding and agreed to proceed.   Hearing/Vision screen Hearing Screening - Comments:: Patient is able to hear conversational tones without difficulty.  No issues reported.  Vision Screening - Comments:: Followed by Dr. Matilde Sprang  Wears corrective lenses  Annual visits  They have regular follow up with the ophthalmologist  Dietary issues and exercise activities discussed: Current Exercise Habits: Home exercise routine, Type of exercise: walking;calisthenics, Time (Minutes): 60, Frequency (Times/Week): 4, Weekly Exercise (Minutes/Week): 240, Intensity: Mild Healthy diet Good water intake   Goals Addressed               This Visit's Progress     Patient Stated     Maintain Healthy Lifestyle (pt-stated)        Walk for exercise Stay hydrated  Healthy diet       Depression Screen    07/26/2022    9:58 AM 07/13/2022    9:41 AM 05/22/2022    9:08 AM 01/16/2022    9:08 AM 07/25/2021   11:31 AM 07/14/2021   10:25  AM 07/13/2020    9:43 AM  PHQ 2/9 Scores  PHQ - 2 Score 0 0 0 0 0 0 0  PHQ- 9 Score    0       Fall Risk    07/26/2022    9:56 AM 07/22/2022    4:35 PM 07/13/2022    9:41 AM 05/22/2022    9:08 AM 01/16/2022    9:08 AM  Fall Risk   Falls in the past year? 0 0 0 0 0  Number falls in past yr: 0 0 0 0 0  Injury with Fall? 0 0 0 0 0  Risk for fall due to :   No Fall Risks No Fall Risks No Fall Risks  Follow up Falls evaluation completed;Falls prevention discussed  Falls evaluation completed Falls evaluation completed Falls evaluation completed    FALL RISK PREVENTION PERTAINING TO THE HOME: Home free of loose throw rugs in walkways, pet beds, electrical cords, etc? Yes  Adequate lighting in your home to reduce risk of falls? Yes   ASSISTIVE DEVICES UTILIZED TO PREVENT FALLS: Life alert? No  Use of a cane, walker or w/c? No   TIMED UP AND GO: Was the test performed? No .   Cognitive Function:    07/13/2019    9:51 AM 07/09/2018    9:55 AM 07/04/2017   11:23 AM 06/22/2016    2:58 PM  MMSE - Mini Mental State Exam  Not completed: Unable to complete     Orientation to time  '5 5 5  '$ Orientation to Place  '5 5 5  '$ Registration  '3 3 3  '$ Attention/ Calculation  '5 5 5  '$ Recall  '3 3 3  '$ Language- name 2 objects  '2 2 2  '$ Language- repeat  '1 1 1  '$ Language- follow 3 step command  $'3 3 3  'C$ Language- read & follow direction  '1 1 1  '$ Write a sentence  '1 1 1  '$ Copy design  '1 1 1  '$ Total score  '30 30 30        '$ 07/26/2022   10:10 AM 07/13/2020    9:47 AM  6CIT Screen  What Year? 0 points 0 points  What month? 0 points 0 points  What time? 0 points 0 points  Months in reverse 0 points 0 points    Immunizations Immunization History  Administered Date(s) Administered   Fluad Quad(high Dose 65+) 03/05/2019, 04/05/2020, 04/25/2021   Influenza, High Dose Seasonal PF 06/22/2016, 07/04/2017, 05/20/2018, 03/05/2019, 04/11/2022   Moderna Covid-19 Vaccine Bivalent Booster 59yr & up 05/11/2022    Moderna Sars-Covid-2 Vaccination 07/15/2019, 08/12/2019, 05/07/2020, 01/16/2021   Pfizer Covid-19 Vaccine Bivalent Booster 5y-11y 05/19/2021   Pneumococcal Conjugate-13 09/28/2014   Pneumococcal Polysaccharide-23 10/04/2016   Respiratory Syncytial Virus Vaccine,Recomb Aduvanted(Arexvy) 04/11/2022   Tdap 09/01/2021   Zoster Recombinat (Shingrix) 03/11/2018, 06/26/2018   Zoster, Live 10/26/2013   Screening Tests Health Maintenance  Topic Date Due   COVID-19 Vaccine (7 - 2023-24 season) 07/29/2022 (Originally 07/06/2022)   MAMMOGRAM  08/03/2022   Medicare Annual Wellness (AWV)  07/27/2023   DTaP/Tdap/Td (2 - Td or Tdap) 09/02/2031   COLONOSCOPY (Pts 45-439yrInsurance coverage will need to be confirmed)  09/22/2031   Pneumonia Vaccine 6512Years old  Completed   INFLUENZA VACCINE  Completed   DEXA SCAN  Completed   Hepatitis C Screening  Completed   Zoster Vaccines- Shingrix  Completed   HPV VACCINES  Aged Out   Health Maintenance There are no preventive care reminders to display for this patient.  Mammogram- agrees   Lung Cancer Screening: (Low Dose CT Chest recommended if Age 478-80ears, 30 pack-year currently smoking OR have quit w/in 15years.) does not qualify.   Hepatitis C Screening: Completed 2019.  Vision Screening: Recommended annual ophthalmology exams for early detection of glaucoma and other disorders of the eye.  Dental Screening: Recommended annual dental exams for proper oral hygiene  Community Resource Referral / Chronic Care Management: CRR required this visit?  No   CCM required this visit?  No      Plan:     I have personally reviewed and noted the following in the patient's chart:   Medical and social history Use of alcohol, tobacco or illicit drugs  Current medications and supplements including opioid prescriptions. Patient is not currently taking opioid prescriptions. Functional ability and status Nutritional status Physical activity Advanced  directives List of other physicians Hospitalizations, surgeries, and ER visits in previous 12 months Vitals Screenings to include cognitive, depression, and falls Referrals and appointments  In addition, I have reviewed and discussed with patient certain preventive protocols, quality metrics, and [place] practice recommendations. A written personalized care plan for preventive services as well as general preventive health recommendations were provided to patient.     DeLeta JunglingLPN   2/01/27/5363

## 2022-08-14 ENCOUNTER — Ambulatory Visit
Admission: RE | Admit: 2022-08-14 | Discharge: 2022-08-14 | Disposition: A | Discharge status: Home / Self Care | Payer: Medicare Other | Source: Ambulatory Visit | Attending: Internal Medicine | Admitting: Internal Medicine

## 2022-08-14 DIAGNOSIS — Z1231 Encounter for screening mammogram for malignant neoplasm of breast: Secondary | ICD-10-CM

## 2022-08-16 ENCOUNTER — Other Ambulatory Visit: Payer: Self-pay | Admitting: Internal Medicine

## 2022-08-23 ENCOUNTER — Other Ambulatory Visit: Payer: Self-pay | Admitting: Internal Medicine

## 2022-08-27 ENCOUNTER — Other Ambulatory Visit: Payer: Self-pay | Admitting: Internal Medicine

## 2022-09-11 DIAGNOSIS — Z23 Encounter for immunization: Secondary | ICD-10-CM | POA: Diagnosis not present

## 2022-10-11 ENCOUNTER — Other Ambulatory Visit (INDEPENDENT_AMBULATORY_CARE_PROVIDER_SITE_OTHER): Payer: Medicare Other

## 2022-10-11 DIAGNOSIS — E7800 Pure hypercholesterolemia, unspecified: Secondary | ICD-10-CM

## 2022-10-11 DIAGNOSIS — R739 Hyperglycemia, unspecified: Secondary | ICD-10-CM | POA: Diagnosis not present

## 2022-10-11 DIAGNOSIS — I10 Essential (primary) hypertension: Secondary | ICD-10-CM | POA: Diagnosis not present

## 2022-10-11 LAB — LIPID PANEL
Cholesterol: 165 mg/dL (ref 0–200)
HDL: 60.9 mg/dL (ref >39.00)
LDL Cholesterol: 77 mg/dL (ref 0–99)
NonHDL: 104.04
Total CHOL/HDL Ratio: 3
Triglycerides: 134 mg/dL (ref 0.0–149.0)
VLDL: 26.8 mg/dL (ref 0.0–40.0)

## 2022-10-11 LAB — BASIC METABOLIC PANEL
BUN: 16 mg/dL (ref 6–23)
CO2: 29 mEq/L (ref 19–32)
Calcium: 9.9 mg/dL (ref 8.4–10.5)
Chloride: 102 mEq/L (ref 96–112)
Creatinine, Ser: 1.16 mg/dL (ref 0.40–1.20)
GFR: 46.56 mL/min — ABNORMAL LOW (ref >60.00)
Glucose, Bld: 99 mg/dL (ref 70–99)
Potassium: 4.3 mEq/L (ref 3.5–5.1)
Sodium: 139 mEq/L (ref 135–145)

## 2022-10-11 LAB — HEMOGLOBIN A1C: Hgb A1c MFr Bld: 5.7 % (ref 4.6–6.5)

## 2022-10-11 LAB — HEPATIC FUNCTION PANEL
ALT: 18 U/L (ref 0–35)
AST: 22 U/L (ref 0–37)
Albumin: 4.4 g/dL (ref 3.5–5.2)
Alkaline Phosphatase: 52 U/L (ref 39–117)
Bilirubin, Direct: 0.1 mg/dL (ref 0.0–0.3)
Total Bilirubin: 0.6 mg/dL (ref 0.2–1.2)
Total Protein: 7.2 g/dL (ref 6.0–8.3)

## 2022-10-15 ENCOUNTER — Encounter: Payer: Self-pay | Admitting: Internal Medicine

## 2022-10-15 ENCOUNTER — Ambulatory Visit (INDEPENDENT_AMBULATORY_CARE_PROVIDER_SITE_OTHER): Payer: Medicare Other | Admitting: Internal Medicine

## 2022-10-15 VITALS — BP 128/76 | HR 74 | Temp 98.3°F | Resp 16 | Ht 64.0 in | Wt 160.0 lb

## 2022-10-15 DIAGNOSIS — N1831 Chronic kidney disease, stage 3a: Secondary | ICD-10-CM | POA: Diagnosis not present

## 2022-10-15 DIAGNOSIS — E049 Nontoxic goiter, unspecified: Secondary | ICD-10-CM | POA: Diagnosis not present

## 2022-10-15 DIAGNOSIS — I10 Essential (primary) hypertension: Secondary | ICD-10-CM

## 2022-10-15 DIAGNOSIS — I479 Paroxysmal tachycardia, unspecified: Secondary | ICD-10-CM | POA: Diagnosis not present

## 2022-10-15 DIAGNOSIS — F439 Reaction to severe stress, unspecified: Secondary | ICD-10-CM

## 2022-10-15 DIAGNOSIS — R739 Hyperglycemia, unspecified: Secondary | ICD-10-CM

## 2022-10-15 DIAGNOSIS — E7800 Pure hypercholesterolemia, unspecified: Secondary | ICD-10-CM

## 2022-10-15 MED ORDER — ATENOLOL 50 MG PO TABS: 50.0000 mg | ORAL_TABLET | Freq: Every day | ORAL | 1 refills | Status: DC

## 2022-10-15 MED ORDER — VALSARTAN-HYDROCHLOROTHIAZIDE 160-12.5 MG PO TABS: 1.0000 | ORAL_TABLET | Freq: Every day | ORAL | 1 refills | Status: DC

## 2022-10-15 MED ORDER — AMLODIPINE BESYLATE 10 MG PO TABS: 10.0000 mg | ORAL_TABLET | Freq: Every day | ORAL | 1 refills | Status: DC

## 2022-10-15 MED ORDER — POTASSIUM CHLORIDE ER 10 MEQ PO TBCR: 10.0000 meq | EXTENDED_RELEASE_TABLET | Freq: Every day | ORAL | 0 refills | Status: DC

## 2022-10-15 NOTE — Progress Notes (Signed)
Subjective:    Patient ID: Cheyenne Baker, female    DOB: [birth year], 72 y.o.   MRN: [id number]  Patient here for  Chief Complaint  Patient presents with   Medical Management of Chronic Issues    HPI Here to follow up regarding hypercholesterolemia and hypertension.  She is s/p right thyroid lobectomy (02/23/22) for enlarging right thyroid nodule.  Pathology - benign.  Recommended f/u - Dr Tedd Sias.  Last seen 06/06/22 - stable.  No scheduled f/u recommended.  Stays active.  No chest pain or sob reported.  No cough or congestion.  No abdominal pain or bowel change reported.  Handling stress.  Discussed labs.    Past Medical History:  Diagnosis Date   Allergy    Diverticulosis    Enlarged thyroid    Hyperlipidemia    Hypertension    Paroxysmal tachycardia (HCC)    Right carotid bruit    Past Surgical History:  Procedure Laterality Date   ABDOMINAL HYSTERECTOMY  1980   ovaries not removed, secondary to bleeding   CHOLECYSTECTOMY  1995   COLONOSCOPY     TONSILLECTOMY AND ADENOIDECTOMY  1975   Family History  Problem Relation Age of Onset   Hypertension Mother    Hyperlipidemia Mother    Heart disease Father    Hypertension Father    Breast cancer Daughter 15   Colon cancer Neg Hx    Colon polyps Neg Hx    Rectal cancer Neg Hx    Esophageal cancer Neg Hx    Stomach cancer Neg Hx    Social History   Socioeconomic History   Marital status: Widowed    Spouse name: Not on file   Number of children: Not on file   Years of education: Not on file   Highest education level: Not on file  Occupational History   Not on file  Tobacco Use   Smoking status: Former    Packs/day: 0.50    Years: 20.00    Additional pack years: 0.00    Total pack years: 10.00    Types: Cigarettes   Smokeless tobacco: Never  Vaping Use   Vaping Use: Never used  Substance and Sexual Activity   Alcohol use: Yes    Alcohol/week: 0.0 standard drinks of alcohol    Comment: occasionally   Drug  use: No   Sexual activity: Yes  Other Topics Concern   Not on file  Social History Narrative   Not on file   Social Determinants of Health   Financial Resource Strain: Low Risk  (07/26/2022)   Overall Financial Resource Strain (CARDIA)    Difficulty of Paying Living Expenses: Not hard at all  Food Insecurity: No Food Insecurity (07/26/2022)   Hunger Vital Sign    Worried About Running Out of Food in the Last Year: Never true    Ran Out of Food in the Last Year: Never true  Transportation Needs: No Transportation Needs (07/26/2022)   PRAPARE - Administrator, Civil Service (Medical): No    Lack of Transportation (Non-Medical): No  Physical Activity: Sufficiently Active (07/26/2022)   Exercise Vital Sign    Days of Exercise per Week: 4 days    Minutes of Exercise per Session: 60 min  Stress: No Stress Concern Present (07/26/2022)   [employer] of Occupational Health - Occupational Stress Questionnaire    Feeling of Stress : Not at all  Social Connections: Unknown (07/26/2022)   Social Connection and Isolation Panel [NHANES]  Frequency of Communication with Friends and Family: More than three times a week    Frequency of Social Gatherings with Friends and Family: More than three times a week    Attends Religious Services: Not on file    Active Member of Clubs or Organizations: Yes    Attends Banker Meetings: More than 4 times per year    Marital Status: Widowed     Review of Systems  Constitutional:  Negative for appetite change and unexpected weight change.  HENT:  Negative for congestion and sinus pressure.   Respiratory:  Negative for cough, chest tightness and shortness of breath.   Cardiovascular:  Negative for chest pain and palpitations.  Gastrointestinal:  Negative for abdominal pain, diarrhea, nausea and vomiting.  Genitourinary:  Negative for difficulty urinating and dysuria.  Musculoskeletal:  Negative for joint swelling and myalgias.  Skin:   Negative for color change and rash.  Neurological:  Negative for dizziness and headaches.  Psychiatric/Behavioral:  Negative for agitation and dysphoric mood.        Objective:     BP 128/76   Pulse 74   Temp 98.3 F (36.8 C)   Resp 16   Ht 5\' 4"  (1.626 m)   Wt 160 lb (72.6 kg)   SpO2 98%   BMI 27.46 kg/m  Wt Readings from Last 3 Encounters:  10/15/22 160 lb (72.6 kg)  07/26/22 155 lb (70.3 kg)  07/13/22 155 lb 3.2 oz (70.4 kg)    Physical Exam Vitals reviewed.  Constitutional:      General: She is not in acute distress.    Appearance: Normal appearance.  HENT:     Head: Normocephalic and atraumatic.     Right Ear: External ear normal.     Left Ear: External ear normal.  Eyes:     General: No scleral icterus.       Right eye: No discharge.        Left eye: No discharge.     Conjunctiva/sclera: Conjunctivae normal.  Neck:     Thyroid: No thyromegaly.  Cardiovascular:     Rate and Rhythm: Normal rate and regular rhythm.  Pulmonary:     Effort: No respiratory distress.     Breath sounds: Normal breath sounds. No wheezing.  Abdominal:     General: Bowel sounds are normal.     Palpations: Abdomen is soft.     Tenderness: There is no abdominal tenderness.  Musculoskeletal:        General: No swelling or tenderness.     Cervical back: Neck supple. No tenderness.  Lymphadenopathy:     Cervical: No cervical adenopathy.  Skin:    Findings: No erythema or rash.  Neurological:     Mental Status: She is alert.  Psychiatric:        Mood and Affect: Mood normal.        Behavior: Behavior normal.      Outpatient Encounter Medications as of 10/15/2022  Medication Sig   amLODipine (NORVASC) 10 MG tablet Take 1 tablet (10 mg total) by mouth daily.   aspirin EC 81 MG tablet Take 81 mg by mouth daily.   atenolol (TENORMIN) 50 MG tablet Take 1 tablet (50 mg total) by mouth daily.   azelastine (ASTELIN) 0.1 % nasal spray Place 1 spray into both nostrils 2 (two) times  daily. Use in each nostril as directed   fluocinonide (LIDEX) 0.05 % external solution Apply 1 Application topically 2 (two) times daily.  potassium chloride (KLOR-CON) 10 MEQ tablet Take 1 tablet (10 mEq total) by mouth daily.   rosuvastatin (CRESTOR) 20 MG tablet TAKE 1 TABLET BY MOUTH DAILY   valsartan-hydrochlorothiazide (DIOVAN-HCT) 160-12.5 MG tablet Take 1 tablet by mouth daily.   [DISCONTINUED] amLODipine (NORVASC) 10 MG tablet TAKE 1 TABLET BY MOUTH DAILY   [DISCONTINUED] atenolol (TENORMIN) 50 MG tablet TAKE 1 TABLET BY MOUTH DAILY   [DISCONTINUED] potassium chloride (KLOR-CON) 10 MEQ tablet Take 1 tablet (10 mEq total) by mouth daily.   [DISCONTINUED] valsartan-hydrochlorothiazide (DIOVAN-HCT) 160-12.5 MG tablet TAKE 1 TABLET BY MOUTH DAILY   No facility-administered encounter medications on file as of 10/15/2022.     Lab Results  Component Value Date   WBC 10.5 10/16/2021   HGB 13.3 10/16/2021   HCT 39.0 10/16/2021   PLT 312.0 10/16/2021   GLUCOSE 99 10/11/2022   CHOL 165 10/11/2022   TRIG 134.0 10/11/2022   HDL 60.90 10/11/2022   LDLDIRECT 143.3 06/11/2013   LDLCALC 77 10/11/2022   ALT 18 10/11/2022   AST 22 10/11/2022   NA 139 10/11/2022   K 4.3 10/11/2022   CL 102 10/11/2022   CREATININE 1.16 10/11/2022   BUN 16 10/11/2022   CO2 29 10/11/2022   TSH 1.05 07/25/2021   HGBA1C 5.7 10/11/2022    MM 3D SCREEN BREAST BILATERAL  Result Date: 08/16/2022 CLINICAL DATA:  Screening. EXAM: DIGITAL SCREENING BILATERAL MAMMOGRAM WITH TOMOSYNTHESIS AND CAD TECHNIQUE: Bilateral screening digital craniocaudal and mediolateral oblique mammograms were obtained. Bilateral screening digital breast tomosynthesis was performed. The images were evaluated with computer-aided detection. COMPARISON:  Previous exam(s). ACR Breast Density Category c: The breasts are heterogeneously dense, which may obscure small masses. FINDINGS: There are no findings suspicious for malignancy. IMPRESSION: No  mammographic evidence of malignancy. A result letter of this screening mammogram will be mailed directly to the patient. RECOMMENDATION: Screening mammogram in one year. (Code:SM-B-01Y) BI-RADS CATEGORY  1: Negative. Electronically Signed   By: Harmon Pier M.D.   On: 08/16/2022 16:01       Assessment & Plan:  Hyperglycemia Assessment & Plan: Low carb diet and exercise.  Follow met b and a1c.   Orders: -     Hemoglobin A1c; Future  Hypercholesterolemia Assessment & Plan: On crestor.  Low cholesterol diet and exercise.  Follow lipid panel and liver function tests.   Lab Results  Component Value Date   CHOL 165 10/11/2022   HDL 60.90 10/11/2022   LDLCALC 77 10/11/2022   LDLDIRECT 143.3 06/11/2013   TRIG 134.0 10/11/2022   CHOLHDL 3 10/11/2022    Orders: -     Lipid panel; Future -     Hepatic function panel; Future  Essential hypertension Assessment & Plan: Blood pressure as outlined.  Continue losartan/hctz, atenolol and amlodipine.  Follow pressures.  Hold on changing medications.  Follow metabolic panel.   Orders: -     Basic metabolic panel; Future -     CBC with Differential/Platelet; Future  Stage 3a chronic kidney disease (HCC) Assessment & Plan: Avoid antiinflammatories.  Stay hydrated.  On losartan. Had discussed renal ultrasound.  Wanted to hold. Recent GFR improved - 46.5.  Follow metabolic panel.    Enlarged thyroid Assessment & Plan: She is s/p right thyroid lobectomy (9/1/123) for enlarging right thyroid nodule.  Pathology - benign.  Recommended f/u - Dr Tedd Sias - lab recheck. .  Last seen 06/06/22 - stable.   Paroxysmal tachycardia (HCC) Assessment & Plan: On atenolol.  No increased heart  rate or palpitations.  Follow.     Stress Assessment & Plan: Overall appears to be handling things relatively well.  Follow.    Other orders -     amLODIPine Besylate; Take 1 tablet (10 mg total) by mouth daily.  Dispense: 90 tablet; Refill: 1 -     Atenolol; Take  1 tablet (50 mg total) by mouth daily.  Dispense: 90 tablet; Refill: 1 -     Potassium Chloride ER; Take 1 tablet (10 mEq total) by mouth daily.  Dispense: 90 tablet; Refill: 0 -     Valsartan-hydroCHLOROthiazide; Take 1 tablet by mouth daily.  Dispense: 90 tablet; Refill: 1     Dale [place], MD

## 2022-10-22 ENCOUNTER — Encounter: Payer: Self-pay | Admitting: Internal Medicine

## 2022-10-22 NOTE — Assessment & Plan Note (Signed)
She is s/p right thyroid lobectomy (9/1/123) for enlarging right thyroid nodule.  Pathology - benign.  Recommended f/u - Dr Solum - lab recheck. .  Last seen 06/06/22 - stable. 

## 2022-10-22 NOTE — Assessment & Plan Note (Signed)
Overall appears to be handling things relatively well.  Follow.   

## 2022-10-22 NOTE — Assessment & Plan Note (Signed)
Blood pressure as outlined.  Continue losartan/hctz, atenolol and amlodipine.  Follow pressures.  Hold on changing medications.  Follow metabolic panel.  

## 2022-10-22 NOTE — Assessment & Plan Note (Signed)
On atenolol.  No increased heart rate or palpitations.  Follow.   

## 2022-10-22 NOTE — Assessment & Plan Note (Signed)
On crestor.  Low cholesterol diet and exercise.  Follow lipid panel and liver function tests.   Lab Results  Component Value Date   CHOL 165 10/11/2022   HDL 60.90 10/11/2022   LDLCALC 77 10/11/2022   LDLDIRECT 143.3 06/11/2013   TRIG 134.0 10/11/2022   CHOLHDL 3 10/11/2022

## 2022-10-22 NOTE — Assessment & Plan Note (Signed)
Avoid antiinflammatories.  Stay hydrated.  On losartan. Had discussed renal ultrasound.  Wanted to hold. Recent GFR improved - 46.5.  Follow metabolic panel.

## 2022-10-22 NOTE — Assessment & Plan Note (Signed)
Low carb diet and exercise.  Follow met b and a1c.   

## 2022-12-31 ENCOUNTER — Encounter: Payer: Self-pay | Admitting: Internal Medicine

## 2023-01-23 ENCOUNTER — Encounter (INDEPENDENT_AMBULATORY_CARE_PROVIDER_SITE_OTHER): Payer: Self-pay

## 2023-02-11 ENCOUNTER — Other Ambulatory Visit: Payer: Medicare Other

## 2023-02-14 ENCOUNTER — Ambulatory Visit: Payer: Medicare Other | Admitting: Internal Medicine

## 2023-02-19 ENCOUNTER — Other Ambulatory Visit (INDEPENDENT_AMBULATORY_CARE_PROVIDER_SITE_OTHER): Payer: Medicare Other

## 2023-02-19 DIAGNOSIS — R739 Hyperglycemia, unspecified: Secondary | ICD-10-CM

## 2023-02-19 DIAGNOSIS — I10 Essential (primary) hypertension: Secondary | ICD-10-CM

## 2023-02-19 DIAGNOSIS — E7800 Pure hypercholesterolemia, unspecified: Secondary | ICD-10-CM

## 2023-02-19 LAB — BASIC METABOLIC PANEL
BUN: 18 mg/dL (ref 6–23)
CO2: 29 mEq/L (ref 19–32)
Calcium: 9.9 mg/dL (ref 8.4–10.5)
Chloride: 103 mEq/L (ref 96–112)
Creatinine, Ser: 1.08 mg/dL (ref 0.40–1.20)
GFR: 50.6 mL/min — ABNORMAL LOW (ref >60.00)
Glucose, Bld: 107 mg/dL — ABNORMAL HIGH (ref 70–99)
Potassium: 3.9 mEq/L (ref 3.5–5.1)
Sodium: 141 mEq/L (ref 135–145)

## 2023-02-19 LAB — CBC WITH DIFFERENTIAL/PLATELET
Basophils Absolute: 0.1 10*3/uL (ref 0.0–0.1)
Basophils Relative: 0.7 % (ref 0.0–3.0)
Eosinophils Absolute: 0.2 10*3/uL (ref 0.0–0.7)
Eosinophils Relative: 1.2 % (ref 0.0–5.0)
HCT: 42.7 % (ref 36.0–46.0)
Hemoglobin: 14.1 g/dL (ref 12.0–15.0)
Lymphocytes Relative: 9.8 % — ABNORMAL LOW (ref 12.0–46.0)
Lymphs Abs: 1.4 10*3/uL (ref 0.7–4.0)
MCHC: 33.1 g/dL (ref 30.0–36.0)
MCV: 93.4 fl (ref 78.0–100.0)
Monocytes Absolute: 0.7 10*3/uL (ref 0.1–1.0)
Monocytes Relative: 5.2 % (ref 3.0–12.0)
Neutro Abs: 11.9 10*3/uL — ABNORMAL HIGH (ref 1.4–7.7)
Neutrophils Relative %: 83.1 % — ABNORMAL HIGH (ref 43.0–77.0)
Platelets: 290 10*3/uL (ref 150.0–400.0)
RBC: 4.57 Mil/uL (ref 3.87–5.11)
RDW: 13.5 % (ref 11.5–15.5)
WBC: 14.4 10*3/uL — ABNORMAL HIGH (ref 4.0–10.5)

## 2023-02-19 LAB — LIPID PANEL
Cholesterol: 165 mg/dL (ref 0–200)
HDL: 53.7 mg/dL (ref >39.00)
LDL Cholesterol: 87 mg/dL (ref 0–99)
NonHDL: 111.49
Total CHOL/HDL Ratio: 3
Triglycerides: 122 mg/dL (ref 0.0–149.0)
VLDL: 24.4 mg/dL (ref 0.0–40.0)

## 2023-02-19 LAB — HEPATIC FUNCTION PANEL
ALT: 18 U/L (ref 0–35)
AST: 20 U/L (ref 0–37)
Albumin: 4.2 g/dL (ref 3.5–5.2)
Alkaline Phosphatase: 57 U/L (ref 39–117)
Bilirubin, Direct: 0.1 mg/dL (ref 0.0–0.3)
Total Bilirubin: 0.6 mg/dL (ref 0.2–1.2)
Total Protein: 7.1 g/dL (ref 6.0–8.3)

## 2023-02-19 LAB — HEMOGLOBIN A1C: Hgb A1c MFr Bld: 5.7 % (ref 4.6–6.5)

## 2023-02-21 ENCOUNTER — Ambulatory Visit (INDEPENDENT_AMBULATORY_CARE_PROVIDER_SITE_OTHER): Payer: Medicare Other | Admitting: Internal Medicine

## 2023-02-21 ENCOUNTER — Encounter: Payer: Self-pay | Admitting: Internal Medicine

## 2023-02-21 VITALS — BP 124/72 | HR 68 | Temp 97.7°F | Ht 64.0 in | Wt 154.6 lb

## 2023-02-21 DIAGNOSIS — I10 Essential (primary) hypertension: Secondary | ICD-10-CM | POA: Diagnosis not present

## 2023-02-21 DIAGNOSIS — I479 Paroxysmal tachycardia, unspecified: Secondary | ICD-10-CM

## 2023-02-21 DIAGNOSIS — E7800 Pure hypercholesterolemia, unspecified: Secondary | ICD-10-CM

## 2023-02-21 DIAGNOSIS — N1831 Chronic kidney disease, stage 3a: Secondary | ICD-10-CM | POA: Diagnosis not present

## 2023-02-21 DIAGNOSIS — F439 Reaction to severe stress, unspecified: Secondary | ICD-10-CM

## 2023-02-21 DIAGNOSIS — E049 Nontoxic goiter, unspecified: Secondary | ICD-10-CM

## 2023-02-21 DIAGNOSIS — D72829 Elevated white blood cell count, unspecified: Secondary | ICD-10-CM | POA: Diagnosis not present

## 2023-02-21 DIAGNOSIS — R739 Hyperglycemia, unspecified: Secondary | ICD-10-CM | POA: Diagnosis not present

## 2023-02-21 NOTE — Progress Notes (Signed)
Subjective:    Patient ID: Cheyenne Baker, female    DOB: [birth year], 72 y.o.   MRN: [id number]  Patient here for  Chief Complaint  Patient presents with   Medical Management of Chronic Issues    HPI Here to follow up regarding hypercholesterolemia and hypertension. She is s/p right thyroid lobectomy (02/23/22) for enlarging right thyroid nodule. Pathology - benign. Recommended f/u - Dr Tedd Sias. Last seen 06/06/22 - stable. No scheduled f/u recommended. Overall doing well.  Tries to stay active.  No chest pain or sob reported.  No cough or congestion.  No abdominal pain or bowel change reported.    Past Medical History:  Diagnosis Date   Allergy    Diverticulosis    Enlarged thyroid    Hyperlipidemia    Hypertension    Paroxysmal tachycardia (HCC)    Right carotid bruit    Past Surgical History:  Procedure Laterality Date   ABDOMINAL HYSTERECTOMY  1980   ovaries not removed, secondary to bleeding   CHOLECYSTECTOMY  1995   COLONOSCOPY     TONSILLECTOMY AND ADENOIDECTOMY  1975   Family History  Problem Relation Age of Onset   Hypertension Mother    Hyperlipidemia Mother    Heart disease Father    Hypertension Father    Breast cancer Daughter 38   Colon cancer Neg Hx    Colon polyps Neg Hx    Rectal cancer Neg Hx    Esophageal cancer Neg Hx    Stomach cancer Neg Hx    Social History   Socioeconomic History   Marital status: Widowed    Spouse name: Not on file   Number of children: Not on file   Years of education: Not on file   Highest education level: Not on file  Occupational History   Not on file  Tobacco Use   Smoking status: Former    Current packs/day: 0.50    Average packs/day: 0.5 packs/day for 20.0 years (10.0 ttl pk-yrs)    Types: Cigarettes   Smokeless tobacco: Never  Vaping Use   Vaping status: Never Used  Substance and Sexual Activity   Alcohol use: Yes    Alcohol/week: 0.0 standard drinks of alcohol    Comment: occasionally   Drug use: No    Sexual activity: Yes  Other Topics Concern   Not on file  Social History Narrative   Not on file   Social Determinants of Health   Financial Resource Strain: Low Risk  (07/26/2022)   Overall Financial Resource Strain (CARDIA)    Difficulty of Paying Living Expenses: Not hard at all  Food Insecurity: No Food Insecurity (07/26/2022)   Hunger Vital Sign    Worried About Running Out of Food in the Last Year: Never true    Ran Out of Food in the Last Year: Never true  Transportation Needs: No Transportation Needs (07/26/2022)   PRAPARE - Administrator, Civil Service (Medical): No    Lack of Transportation (Non-Medical): No  Physical Activity: Sufficiently Active (07/26/2022)   Exercise Vital Sign    Days of Exercise per Week: 4 days    Minutes of Exercise per Session: 60 min  Stress: No Stress Concern Present (07/26/2022)   [employer] of Occupational Health - Occupational Stress Questionnaire    Feeling of Stress : Not at all  Social Connections: Unknown (07/26/2022)   Social Connection and Isolation Panel [NHANES]    Frequency of Communication with Friends and Family: More  than three times a week    Frequency of Social Gatherings with Friends and Family: More than three times a week    Attends Religious Services: Not on file    Active Member of Clubs or Organizations: Yes    Attends Banker Meetings: More than 4 times per year    Marital Status: Widowed     Review of Systems  Constitutional:  Negative for appetite change and unexpected weight change.  HENT:  Negative for congestion and sinus pressure.   Respiratory:  Negative for cough, chest tightness and shortness of breath.   Cardiovascular:  Negative for chest pain, palpitations and leg swelling.  Gastrointestinal:  Negative for abdominal pain, diarrhea, nausea and vomiting.  Genitourinary:  Negative for difficulty urinating and dysuria.  Musculoskeletal:  Negative for joint swelling and myalgias.   Skin:  Negative for color change and rash.  Neurological:  Negative for dizziness and headaches.  Psychiatric/Behavioral:  Negative for agitation and dysphoric mood.        Objective:     BP 124/72   Pulse 68   Temp 97.7 F (36.5 C) (Oral)   Ht 5\' 4"  (1.626 m)   Wt 154 lb 9.6 oz (70.1 kg)   SpO2 99%   BMI 26.54 kg/m  Wt Readings from Last 3 Encounters:  02/21/23 154 lb 9.6 oz (70.1 kg)  10/15/22 160 lb (72.6 kg)  07/26/22 155 lb (70.3 kg)    Physical Exam Vitals reviewed.  Constitutional:      General: She is not in acute distress.    Appearance: Normal appearance.  HENT:     Head: Normocephalic and atraumatic.     Right Ear: External ear normal.     Left Ear: External ear normal.  Eyes:     General: No scleral icterus.       Right eye: No discharge.        Left eye: No discharge.     Conjunctiva/sclera: Conjunctivae normal.  Neck:     Thyroid: No thyromegaly.  Cardiovascular:     Rate and Rhythm: Normal rate and regular rhythm.  Pulmonary:     Effort: No respiratory distress.     Breath sounds: Normal breath sounds. No wheezing.  Abdominal:     General: Bowel sounds are normal.     Palpations: Abdomen is soft.     Tenderness: There is no abdominal tenderness.  Musculoskeletal:        General: No swelling or tenderness.     Cervical back: Neck supple. No tenderness.  Lymphadenopathy:     Cervical: No cervical adenopathy.  Skin:    Findings: No erythema or rash.  Neurological:     Mental Status: She is alert.  Psychiatric:        Mood and Affect: Mood normal.        Behavior: Behavior normal.      Outpatient Encounter Medications as of 02/21/2023  Medication Sig   amLODipine (NORVASC) 10 MG tablet Take 1 tablet (10 mg total) by mouth daily.   aspirin EC 81 MG tablet Take 81 mg by mouth daily.   atenolol (TENORMIN) 50 MG tablet Take 1 tablet (50 mg total) by mouth daily.   azelastine (ASTELIN) 0.1 % nasal spray Place 1 spray into both nostrils 2  (two) times daily. Use in each nostril as directed   fluocinonide (LIDEX) 0.05 % external solution Apply 1 Application topically 2 (two) times daily.   potassium chloride (KLOR-CON) 10 MEQ tablet  Take 1 tablet (10 mEq total) by mouth daily.   rosuvastatin (CRESTOR) 20 MG tablet TAKE 1 TABLET BY MOUTH DAILY   valsartan-hydrochlorothiazide (DIOVAN-HCT) 160-12.5 MG tablet Take 1 tablet by mouth daily.   No facility-administered encounter medications on file as of 02/21/2023.     Lab Results  Component Value Date   WBC 14.4 (H) 02/19/2023   HGB 14.1 02/19/2023   HCT 42.7 02/19/2023   PLT 290.0 02/19/2023   GLUCOSE 107 (H) 02/19/2023   CHOL 165 02/19/2023   TRIG 122.0 02/19/2023   HDL 53.70 02/19/2023   LDLDIRECT 143.3 06/11/2013   LDLCALC 87 02/19/2023   ALT 18 02/19/2023   AST 20 02/19/2023   NA 141 02/19/2023   K 3.9 02/19/2023   CL 103 02/19/2023   CREATININE 1.08 02/19/2023   BUN 18 02/19/2023   CO2 29 02/19/2023   TSH 1.05 07/25/2021   HGBA1C 5.7 02/19/2023    MM 3D SCREEN BREAST BILATERAL  Result Date: 08/16/2022 CLINICAL DATA:  Screening. EXAM: DIGITAL SCREENING BILATERAL MAMMOGRAM WITH TOMOSYNTHESIS AND CAD TECHNIQUE: Bilateral screening digital craniocaudal and mediolateral oblique mammograms were obtained. Bilateral screening digital breast tomosynthesis was performed. The images were evaluated with computer-aided detection. COMPARISON:  Previous exam(s). ACR Breast Density Category c: The breasts are heterogeneously dense, which may obscure small masses. FINDINGS: There are no findings suspicious for malignancy. IMPRESSION: No mammographic evidence of malignancy. A result letter of this screening mammogram will be mailed directly to the patient. RECOMMENDATION: Screening mammogram in one year. (Code:SM-B-01Y) BI-RADS CATEGORY  1: Negative. Electronically Signed   By: Harmon Pier M.D.   On: 08/16/2022 16:01       Assessment & Plan:  Leukocytosis, unspecified type -      CBC with Differential/Platelet; Future  Stage 3a chronic kidney disease (HCC) Assessment & Plan: Avoid antiinflammatories.  Stay hydrated.  On losartan. Had discussed renal ultrasound.  Wanted to hold. Recent GFR improved  - 50 on recent check.  Follow metabolic panel.    Enlarged thyroid Assessment & Plan: She is s/p right thyroid lobectomy (9/1/123) for enlarging right thyroid nodule.  Pathology - benign.  Recommended f/u - Dr Tedd Sias - lab recheck. .  Last seen 06/06/22 - stable. No f/u recommended.    Essential hypertension Assessment & Plan: Blood pressure as outlined.  Continue losartan/hctz, atenolol and amlodipine.  Follow pressures.  Hold on changing medications.  Follow metabolic panel.    Hypercholesterolemia Assessment & Plan: On crestor.  Low cholesterol diet and exercise.  Follow lipid panel and liver function tests.   Lab Results  Component Value Date   CHOL 165 02/19/2023   HDL 53.70 02/19/2023   LDLCALC 87 02/19/2023   LDLDIRECT 143.3 06/11/2013   TRIG 122.0 02/19/2023   CHOLHDL 3 02/19/2023     Hyperglycemia Assessment & Plan: Low carb diet and exercise.  Follow met b and a1c.    Paroxysmal tachycardia (HCC) Assessment & Plan: On atenolol.  No increased heart rate or palpitations.  Follow.     Stress Assessment & Plan: Overall appears to be handling things relatively well.  Follow.       Dale [place], MD

## 2023-02-24 ENCOUNTER — Encounter: Payer: Self-pay | Admitting: Internal Medicine

## 2023-02-24 NOTE — Assessment & Plan Note (Signed)
Low carb diet and exercise.  Follow met b and a1c.   

## 2023-02-24 NOTE — Assessment & Plan Note (Signed)
On crestor.  Low cholesterol diet and exercise.  Follow lipid panel and liver function tests.   Lab Results  Component Value Date   CHOL 165 02/19/2023   HDL 53.70 02/19/2023   LDLCALC 87 02/19/2023   LDLDIRECT 143.3 06/11/2013   TRIG 122.0 02/19/2023   CHOLHDL 3 02/19/2023

## 2023-02-24 NOTE — Assessment & Plan Note (Signed)
Blood pressure as outlined.  Continue losartan/hctz, atenolol and amlodipine.  Follow pressures.  Hold on changing medications.  Follow metabolic panel.

## 2023-02-24 NOTE — Assessment & Plan Note (Signed)
Overall appears to be handling things relatively well.  Follow.   

## 2023-02-24 NOTE — Assessment & Plan Note (Signed)
Avoid antiinflammatories.  Stay hydrated.  On losartan. Had discussed renal ultrasound.  Wanted to hold. Recent GFR improved  - 50 on recent check.  Follow metabolic panel.

## 2023-02-24 NOTE — Assessment & Plan Note (Signed)
On atenolol.  No increased heart rate or palpitations.  Follow.   

## 2023-02-24 NOTE — Assessment & Plan Note (Signed)
She is s/p right thyroid lobectomy (9/1/123) for enlarging right thyroid nodule.  Pathology - benign.  Recommended f/u - Dr Tedd Sias - lab recheck. .  Last seen 06/06/22 - stable. No f/u recommended.

## 2023-02-26 ENCOUNTER — Other Ambulatory Visit (INDEPENDENT_AMBULATORY_CARE_PROVIDER_SITE_OTHER): Payer: Medicare Other

## 2023-02-26 DIAGNOSIS — D72829 Elevated white blood cell count, unspecified: Secondary | ICD-10-CM

## 2023-02-26 LAB — CBC WITH DIFFERENTIAL/PLATELET
Basophils Absolute: 0 10*3/uL (ref 0.0–0.1)
Basophils Relative: 0.4 % (ref 0.0–3.0)
Eosinophils Absolute: 0.1 10*3/uL (ref 0.0–0.7)
Eosinophils Relative: 1.3 % (ref 0.0–5.0)
HCT: 42.2 % (ref 36.0–46.0)
Hemoglobin: 13.9 g/dL (ref 12.0–15.0)
Lymphocytes Relative: 13.5 % (ref 12.0–46.0)
Lymphs Abs: 1.3 10*3/uL (ref 0.7–4.0)
MCHC: 32.8 g/dL (ref 30.0–36.0)
MCV: 93.4 fl (ref 78.0–100.0)
Monocytes Absolute: 0.8 10*3/uL (ref 0.1–1.0)
Monocytes Relative: 8.4 % (ref 3.0–12.0)
Neutro Abs: 7.5 10*3/uL (ref 1.4–7.7)
Neutrophils Relative %: 76.4 % (ref 43.0–77.0)
Platelets: 299 10*3/uL (ref 150.0–400.0)
RBC: 4.52 Mil/uL (ref 3.87–5.11)
RDW: 13.5 % (ref 11.5–15.5)
WBC: 9.8 10*3/uL (ref 4.0–10.5)

## 2023-04-08 DIAGNOSIS — Z23 Encounter for immunization: Secondary | ICD-10-CM | POA: Diagnosis not present

## 2023-04-19 ENCOUNTER — Other Ambulatory Visit: Payer: Self-pay | Admitting: Internal Medicine

## 2023-05-05 ENCOUNTER — Other Ambulatory Visit: Payer: Self-pay | Admitting: Internal Medicine

## 2023-05-20 ENCOUNTER — Other Ambulatory Visit: Payer: Self-pay | Admitting: Internal Medicine

## 2023-05-30 DIAGNOSIS — Z23 Encounter for immunization: Secondary | ICD-10-CM | POA: Diagnosis not present

## 2023-06-25 ENCOUNTER — Telehealth: Payer: Self-pay | Admitting: *Deleted

## 2023-06-25 DIAGNOSIS — R739 Hyperglycemia, unspecified: Secondary | ICD-10-CM

## 2023-06-25 DIAGNOSIS — I10 Essential (primary) hypertension: Secondary | ICD-10-CM

## 2023-06-25 DIAGNOSIS — E7800 Pure hypercholesterolemia, unspecified: Secondary | ICD-10-CM

## 2023-06-25 NOTE — Telephone Encounter (Signed)
 Please place future lab orders for lab appt next week. Thanks

## 2023-06-25 NOTE — Telephone Encounter (Signed)
 Future labs ordered.

## 2023-07-01 ENCOUNTER — Other Ambulatory Visit: Payer: Medicare Other

## 2023-07-01 ENCOUNTER — Telehealth: Payer: Self-pay | Admitting: *Deleted

## 2023-07-01 NOTE — Telephone Encounter (Signed)
 Called pt to reschedule lab appt this morning & she notified me that her mom passed away. She also asked that I cancel her appt with PCP on Wednesday. Appt has been cancelled & pt states that she will call back to reschedule at a later time.

## 2023-07-03 ENCOUNTER — Ambulatory Visit: Payer: Medicare Other | Admitting: Internal Medicine

## 2023-07-20 ENCOUNTER — Other Ambulatory Visit: Payer: Self-pay | Admitting: Internal Medicine

## 2023-07-29 ENCOUNTER — Ambulatory Visit (INDEPENDENT_AMBULATORY_CARE_PROVIDER_SITE_OTHER): Payer: Medicare Other | Admitting: *Deleted

## 2023-07-29 VITALS — Ht 64.0 in | Wt 150.0 lb

## 2023-07-29 DIAGNOSIS — Z780 Asymptomatic menopausal state: Secondary | ICD-10-CM

## 2023-07-29 DIAGNOSIS — Z0000 Encounter for general adult medical examination without abnormal findings: Secondary | ICD-10-CM | POA: Diagnosis not present

## 2023-07-29 DIAGNOSIS — Z1231 Encounter for screening mammogram for malignant neoplasm of breast: Secondary | ICD-10-CM | POA: Diagnosis not present

## 2023-07-29 NOTE — Patient Instructions (Addendum)
Cheyenne Baker , Thank you for taking time to come for your Medicare Wellness Visit. I appreciate your ongoing commitment to your health goals. Please review the following plan we discussed and let me know if I can assist you in the future.   Referrals/Orders/Follow-Ups/Clinician Recommendations: Mammogram and Bone Density. You have an order for:  []   2D Mammogram  [x]   3D Mammogram  [x]   Bone Density     Please call for appointment:  [facility]  [address] [place] [address] [phone]    Make sure to wear two-piece clothing.  No lotions, powders, or deodorants the day of the appointment. Make sure to bring picture ID and insurance card.  Bring list of medications you are currently taking including any supplements.   Schedule your [facility] screening mammogram through MyChart!   Log into your MyChart account.  Go to 'Visit' (or 'Appointments' if on mobile App) --> Schedule an Appointment  Under 'Select a Reason for Visit' choose the Mammogram Screening option.  Complete the pre-visit questions and select the time and place that [facility] fits your schedule.    This is a list of the screening recommended for you and due dates:  Health Maintenance  Topic Date Due   COVID-19 Vaccine (8 - 2024-25 season) 02/24/2023   Mammogram  08/15/2023   Medicare Annual Wellness Visit  07/28/2024   DTaP/Tdap/Td vaccine (2 - Td or Tdap) 09/02/2031   Colon Cancer Screening  09/22/2031   Pneumonia Vaccine  Completed   Flu Shot  Completed   DEXA scan (bone density measurement)  Completed   Hepatitis C Screening  Completed   Zoster (Shingles) Vaccine  Completed   HPV Vaccine  Aged Out    Advanced directives: (Copy Requested) Please bring a copy of your health care power of attorney and living will to the office to be added to your chart at your convenience.  Next Medicare Annual Wellness Visit scheduled for next year: Yes 08/03/24 @  1:40

## 2023-07-29 NOTE — Progress Notes (Signed)
Subjective:   Cheyenne Baker is a 73 y.o. female who presents for Medicare Annual (Subsequent) preventive examination.  Visit Complete: Virtual I connected with  Tamsen Roers Aubert on 07/29/23 by a audio enabled telemedicine application and verified that I am speaking with the correct person using two identifiers.Interactive audio and video telecommunications were attempted between this provider and patient, however failed, due to patient having technical difficulties OR patient did not have access to video capability.  We continued and completed visit with audio only.   Patient Location: Home  Provider Location: Office/Clinic  I discussed the limitations of evaluation and management by telemedicine. The patient expressed understanding and agreed to proceed.  Vital Signs: Because this visit was a virtual/telehealth visit, some criteria may be missing or patient reported. Any vitals not documented were not able to be obtained and vitals that have been documented are patient reported.   Cardiac Risk Factors include: advanced age (>56men, >12 women);dyslipidemia;hypertension     Objective:    Today's Vitals   07/29/23 1304  Weight: 150 lb (68 kg)  Height: 5\' 4"  (1.626 m)   Body mass index is 25.75 kg/m.     07/29/2023    1:20 PM 07/26/2022    9:58 AM 07/13/2020    9:45 AM 07/13/2019    9:40 AM 07/09/2018    9:52 AM 05/02/2018   11:21 AM 04/17/2018   12:52 AM  Advanced Directives  Does Patient Have a Medical Advance Directive? Yes Yes Yes Yes Yes Yes No;Yes  Type of Estate agent of [place];Living will Healthcare Power of [place];Living will Healthcare Power of [place];Living will Healthcare Power of [place];Living will Healthcare Power of [place];Living will Healthcare Power of [place];Living will Healthcare Power of [place];Living will  Does patient want to make changes to medical advance directive?  No - Patient declined No - Patient declined No - Patient  declined No - Patient declined No - Patient declined No - Patient declined  Copy of Healthcare Power of Attorney in Chart? No - copy requested No - copy requested No - copy requested No - copy requested No - copy requested No - copy requested No - copy requested  Would patient like information on creating a medical advance directive?      No - Patient declined No - Patient declined    Current Medications (verified) Outpatient Encounter Medications as of 07/29/2023  Medication Sig   amLODipine (NORVASC) 10 MG tablet Take 1 tablet (10 mg total) by mouth daily.   aspirin EC 81 MG tablet Take 81 mg by mouth daily.   atenolol (TENORMIN) 50 MG tablet TAKE 1 TABLET BY MOUTH DAILY   azelastine (ASTELIN) 0.1 % nasal spray Place 1 spray into both nostrils 2 (two) times daily. Use in each nostril as directed   fluocinonide (LIDEX) 0.05 % external solution Apply 1 Application topically 2 (two) times daily.   potassium chloride (KLOR-CON) 10 MEQ tablet TAKE 1 TABLET BY MOUTH DAILY   rosuvastatin (CRESTOR) 20 MG tablet TAKE 1 TABLET BY MOUTH DAILY   valsartan-hydrochlorothiazide (DIOVAN-HCT) 160-12.5 MG tablet Take 1 tablet by mouth daily.   No facility-administered encounter medications on file as of 07/29/2023.    Allergies (verified) Patient has no known allergies.   History: Past Medical History:  Diagnosis Date   Allergy    Diverticulosis    Enlarged thyroid    Hyperlipidemia    Hypertension    Paroxysmal tachycardia (HCC)    Right carotid bruit  Past Surgical History:  Procedure Laterality Date   ABDOMINAL HYSTERECTOMY  06/25/1978   ovaries not removed, secondary to bleeding   CHOLECYSTECTOMY  06/25/1993   COLONOSCOPY     THYROID SURGERY  2024   TONSILLECTOMY AND ADENOIDECTOMY  06/25/1973   Family History  Problem Relation Age of Onset   Hypertension Mother    Hyperlipidemia Mother    Heart disease Father    Hypertension Father    Breast cancer Daughter 66   Colon cancer Neg  Hx    Colon polyps Neg Hx    Rectal cancer Neg Hx    Esophageal cancer Neg Hx    Stomach cancer Neg Hx    Social History   Socioeconomic History   Marital status: Widowed    Spouse name: Not on file   Number of children: Not on file   Years of education: Not on file   Highest education level: Not on file  Occupational History   Not on file  Tobacco Use   Smoking status: Former    Current packs/day: 0.50    Average packs/day: 0.5 packs/day for 20.0 years (10.0 ttl pk-yrs)    Types: Cigarettes   Smokeless tobacco: Never  Vaping Use   Vaping status: Never Used  Substance and Sexual Activity   Alcohol use: Yes    Alcohol/week: 0.0 standard drinks of alcohol    Comment: occasionally   Drug use: No   Sexual activity: Yes  Other Topics Concern   Not on file  Social History Narrative   Not on file   Social Drivers of Health   Financial Resource Strain: Low Risk  (07/29/2023)   Overall Financial Resource Strain (CARDIA)    Difficulty of Paying Living Expenses: Not hard at all  Food Insecurity: No Food Insecurity (07/29/2023)   Hunger Vital Sign    Worried About Running Out of Food in the Last Year: Never true    Ran Out of Food in the Last Year: Never true  Transportation Needs: No Transportation Needs (07/29/2023)   PRAPARE - Administrator, Civil Service (Medical): No    Lack of Transportation (Non-Medical): No  Physical Activity: Insufficiently Active (07/29/2023)   Exercise Vital Sign    Days of Exercise per Week: 3 days    Minutes of Exercise per Session: 40 min  Stress: No Stress Concern Present (07/29/2023)   [employer] of Occupational Health - Occupational Stress Questionnaire    Feeling of Stress : Only a little  Social Connections: Moderately Integrated (07/29/2023)   Social Connection and Isolation Panel [NHANES]    Frequency of Communication with Friends and Family: More than three times a week    Frequency of Social Gatherings with Friends and  Family: More than three times a week    Attends Religious Services: More than 4 times per year    Active Member of [employer] or Organizations: Yes    Attends Banker Meetings: More than 4 times per year    Marital Status: Widowed    Tobacco Counseling Counseling given: Not Answered   Clinical Intake:  Pre-visit preparation completed: Yes  Pain : No/denies pain     BMI - recorded: 25.75 Nutritional Status: BMI 25 -29 Overweight Nutritional Risks: None Diabetes: No  How often do you need to have someone help you when you read instructions, pamphlets, or other written materials from your doctor or pharmacy?: 1 - Never  Interpreter Needed?: No  Information entered by :: R. Glendoris Nodarse  LPN   Activities of Daily Living    07/29/2023    1:10 PM  In your present state of health, do you have any difficulty performing the following activities:  Hearing? 0  Vision? 0  Difficulty concentrating or making decisions? 0  Walking or climbing stairs? 0  Dressing or bathing? 0  Doing errands, shopping? 0  Preparing Food and eating ? N  Using the Toilet? N  In the past six months, have you accidently leaked urine? N  Do you have problems with loss of bowel control? N  Managing your Medications? N  Managing your Finances? N  Housekeeping or managing your Housekeeping? N    Patient Care Team: Dale [place], MD as PCP - General (Internal Medicine)  Indicate any recent Medical Services you may have received from other than [facility] providers in the past year (date may be approximate).     Assessment:   This is a routine wellness examination for Dyesha.  Hearing/Vision screen Hearing Screening - Comments:: No issues Vision Screening - Comments:: glasses   Goals Addressed             This Visit's Progress    Patient Stated       Wants to exercise more       Depression Screen    07/29/2023    1:14 PM 02/21/2023    9:36 AM 07/26/2022    9:58 AM 07/13/2022    9:41 AM  05/22/2022    9:08 AM 01/16/2022    9:08 AM 07/25/2021   11:31 AM  PHQ 2/9 Scores  PHQ - 2 Score 1 0 0 0 0 0 0  PHQ- 9 Score 2 0    0     Fall Risk    07/29/2023    1:11 PM 02/21/2023    9:36 AM 07/26/2022    9:56 AM 07/22/2022    4:35 PM 07/13/2022    9:41 AM  Fall Risk   Falls in the past year? 0 0 0 0 0  Number falls in past yr: 0 0 0 0 0  Injury with Fall? 0 0 0 0 0  Risk for fall due to : No Fall Risks No Fall Risks   No Fall Risks  Follow up Falls prevention discussed;Falls evaluation completed Falls evaluation completed Falls evaluation completed;Falls prevention discussed  Falls evaluation completed    MEDICARE RISK AT HOME: Medicare Risk at Home Any stairs in or around the home?: Yes If so, are there any without handrails?: No Home free of loose throw rugs in walkways, pet beds, electrical cords, etc?: Yes Adequate lighting in your home to reduce risk of falls?: Yes Life alert?: No Use of a cane, walker or w/c?: No Grab bars in the bathroom?: Yes Shower chair or bench in shower?: No Elevated toilet seat or a handicapped toilet?: Yes   Cognitive Function:    07/13/2019    9:51 AM 07/09/2018    9:55 AM 07/04/2017   11:23 AM 06/22/2016    2:58 PM  MMSE - Mini Mental State Exam  Not completed: Unable to complete     Orientation to time  5 5 5   Orientation to Place  5 5 5   Registration  3 3 3   Attention/ Calculation  5 5 5   Recall  3 3 3   Language- name 2 objects  2 2 2   Language- repeat  1 1 1   Language- follow 3 step command  3 3 3   Language- read &  follow direction  1 1 1   Write a sentence  1 1 1   Copy design  1 1 1   Total score  30 30 30         07/29/2023    1:21 PM 07/26/2022   10:10 AM 07/13/2020    9:47 AM  6CIT Screen  What Year? 0 points 0 points 0 points  What month? 0 points 0 points 0 points  What time? 0 points 0 points 0 points  Count back from 20 0 points    Months in reverse 0 points 0 points 0 points  Repeat phrase 0 points    Total Score 0  points      Immunizations Immunization History  Administered Date(s) Administered   Fluad Quad(high Dose 65+) 03/05/2019, 04/05/2020, 04/25/2021   Influenza, High Dose Seasonal PF 06/22/2016, 07/04/2017, 05/20/2018, 03/05/2019, 04/11/2022   Moderna Covid-19 Fall Seasonal Vaccine 2yrs & older 09/11/2022   Moderna Covid-19 Vaccine Bivalent Booster 24yrs & up 05/11/2022   Moderna Sars-Covid-2 Vaccination 07/15/2019, 08/12/2019, 05/07/2020, 01/16/2021   Pfizer Covid-19 Vaccine Bivalent Booster 5y-11y 05/19/2021   Pneumococcal Conjugate-13 09/28/2014   Pneumococcal Polysaccharide-23 10/04/2016   Respiratory Syncytial Virus Vaccine,Recomb Aduvanted(Arexvy) 04/11/2022   Tdap 09/01/2021   Zoster Recombinant(Shingrix) 03/11/2018, 06/26/2018   Zoster, Live 10/26/2013    TDAP status: Up to date  Flu Vaccine status: Up to date  Pneumococcal vaccine status: Up to date  Covid-19 vaccine status: Completed vaccines  Qualifies for Shingles Vaccine? Yes   Zostavax completed Yes   Shingrix Completed?: Yes  Screening Tests Health Maintenance  Topic Date Due   COVID-19 Vaccine (8 - 2024-25 season) 02/24/2023   Medicare Annual Wellness (AWV)  07/27/2023   MAMMOGRAM  08/15/2023   DTaP/Tdap/Td (2 - Td or Tdap) 09/02/2031   Colonoscopy  09/22/2031   Pneumonia Vaccine 3+ Years old  Completed   INFLUENZA VACCINE  Completed   DEXA SCAN  Completed   Hepatitis C Screening  Completed   Zoster Vaccines- Shingrix  Completed   HPV VACCINES  Aged Out    Health Maintenance  Health Maintenance Due  Topic Date Due   COVID-19 Vaccine (8 - 2024-25 season) 02/24/2023   Medicare Annual Wellness (AWV)  07/27/2023    Colorectal cancer screening: Type of screening: Colonoscopy. Completed 08/2021. Repeat every 10 years Patient stated she was told no longer needed because of age  Mammogram status: Completed 07/2022. Repeat every year Order placed  Bone Density status: Ordered 07/29/23. Pt provided with  contact info and advised to call to schedule appt.  Lung Cancer Screening: (Low Dose CT Chest recommended if Age 41-80 years, 20 pack-year currently smoking OR have quit w/in 15years.) does not qualify.    Additional Screening:  Hepatitis C Screening: does qualify; Completed 06/2017  Vision Screening: Recommended annual ophthalmology exams for early detection of glaucoma and other disorders of the eye. Is the patient up to date with their annual eye exam?  Yes  Who is the provider or what is the name of the office in which the patient attends annual eye exams? [facility] If pt is not established with a provider, would they like to be referred to a provider to establish care? No .   Dental Screening: Recommended annual dental exams for proper oral hygiene    Community Resource Referral / Chronic Care Management: CRR required this visit?  No   CCM required this visit?  No     Plan:     I have personally  reviewed and noted the following in the patient's chart:   Medical and social history Use of alcohol, tobacco or illicit drugs  Current medications and supplements including opioid prescriptions. Patient is not currently taking opioid prescriptions. Functional ability and status Nutritional status Physical activity Advanced directives List of other physicians Hospitalizations, surgeries, and ER visits in previous 12 months Vitals Screenings to include cognitive, depression, and falls Referrals and appointments  In addition, I have reviewed and discussed with patient certain preventive protocols, quality metrics, and [place] practice recommendations. A written personalized care plan for preventive services as well as general preventive health recommendations were provided to patient.     Sydell Axon, LPN   06/30/1094   After Visit Summary: (MyChart) Due to this being a telephonic visit, the after visit summary with patients personalized plan was offered to patient via  MyChart   Nurse Notes: see routing comments

## 2023-08-01 ENCOUNTER — Ambulatory Visit: Payer: Medicare Other

## 2023-08-01 ENCOUNTER — Encounter: Payer: Self-pay | Admitting: Internal Medicine

## 2023-08-01 ENCOUNTER — Ambulatory Visit (INDEPENDENT_AMBULATORY_CARE_PROVIDER_SITE_OTHER): Payer: Medicare Other | Admitting: Internal Medicine

## 2023-08-01 VITALS — BP 128/72 | HR 65 | Temp 97.9°F | Resp 16 | Ht 64.0 in | Wt 154.0 lb

## 2023-08-01 DIAGNOSIS — M545 Low back pain, unspecified: Secondary | ICD-10-CM | POA: Diagnosis not present

## 2023-08-01 DIAGNOSIS — I10 Essential (primary) hypertension: Secondary | ICD-10-CM

## 2023-08-01 DIAGNOSIS — M5136 Other intervertebral disc degeneration, lumbar region with discogenic back pain only: Secondary | ICD-10-CM | POA: Diagnosis not present

## 2023-08-01 DIAGNOSIS — Z1231 Encounter for screening mammogram for malignant neoplasm of breast: Secondary | ICD-10-CM

## 2023-08-01 DIAGNOSIS — I700 Atherosclerosis of aorta: Secondary | ICD-10-CM | POA: Diagnosis not present

## 2023-08-01 DIAGNOSIS — M4186 Other forms of scoliosis, lumbar region: Secondary | ICD-10-CM | POA: Diagnosis not present

## 2023-08-01 NOTE — Progress Notes (Signed)
 Subjective:    Patient ID: Cheyenne Baker, female    DOB: 17-Apr-1951, 73 y.o.   MRN: [id number]  Patient here for  Chief Complaint  Patient presents with   Back Pain    HPI Here as a work in with concerns regarding back pain. Reports left side - low back pain. Noticed beginning of January (1/5). No significant pain/discomfort with sitting. When goes to stand, will notice a catch. Increased pain. Once moving - does appear to improve. Taking tylenol. Has noticed increased soft tissue fullness  - low back. Easily movable. No radicular symptoms. Specifically no pain radiating down her legs. No numbness or tingling. Continues on losartan /hydrochlorothiazide , atenolol  and amlodipine .  No bowel or bladder change.    Past Medical History:  Diagnosis Date   Allergy    Diverticulosis    Enlarged thyroid     Hyperlipidemia    Hypertension    Paroxysmal tachycardia (HCC)    Right carotid bruit    Past Surgical History:  Procedure Laterality Date   ABDOMINAL HYSTERECTOMY  06/25/1978   ovaries not removed, secondary to bleeding   CHOLECYSTECTOMY  06/25/1993   COLONOSCOPY     THYROID  SURGERY  2024   TONSILLECTOMY AND ADENOIDECTOMY  06/25/1973   Family History  Problem Relation Age of Onset   Hypertension Mother    Hyperlipidemia Mother    Heart disease Father    Hypertension Father    Breast cancer Daughter 74   Colon cancer Neg Hx    Colon polyps Neg Hx    Rectal cancer Neg Hx    Esophageal cancer Neg Hx    Stomach cancer Neg Hx    Social History   Socioeconomic History   Marital status: Widowed    Spouse name: Not on file   Number of children: Not on file   Years of education: Not on file   Highest education level: 12th grade  Occupational History   Not on file  Tobacco Use   Smoking status: Former    Current packs/day: 0.50    Average packs/day: 0.5 packs/day for 20.0 years (10.0 ttl pk-yrs)    Types: Cigarettes   Smokeless tobacco: Never  Vaping Use   Vaping  status: Never Used  Substance and Sexual Activity   Alcohol use: Yes    Alcohol/week: 0.0 standard drinks of alcohol    Comment: occasionally   Drug use: No   Sexual activity: Yes  Other Topics Concern   Not on file  Social History Narrative   Not on file   Social Drivers of Health   Financial Resource Strain: Low Risk  (07/29/2023)   Overall Financial Resource Strain (CARDIA)    Difficulty of Paying Living Expenses: Not hard at all  Food Insecurity: No Food Insecurity (07/29/2023)   Hunger Vital Sign    Worried About Running Out of Food in the Last Year: Never true    Ran Out of Food in the Last Year: Never true  Transportation Needs: No Transportation Needs (07/29/2023)   PRAPARE - Administrator, Civil Service (Medical): No    Lack of Transportation (Non-Medical): No  Physical Activity: Insufficiently Active (07/29/2023)   Exercise Vital Sign    Days of Exercise per Week: 3 days    Minutes of Exercise per Session: 40 min  Stress: No Stress Concern Present (07/29/2023)   [employer] of Occupational Health - Occupational Stress Questionnaire    Feeling of Stress : Not at all  Social Connections:  Moderately Integrated (07/29/2023)   Social Connection and Isolation Panel [NHANES]    Frequency of Communication with Friends and Family: More than three times a week    Frequency of Social Gatherings with Friends and Family: Three times a week    Attends Religious Services: More than 4 times per year    Active Member of Clubs or Organizations: Yes    Attends Banker Meetings: More than 4 times per year    Marital Status: Widowed     Review of Systems  Constitutional:  Negative for appetite change and unexpected weight change.  HENT:  Negative for congestion and sinus pressure.   Respiratory:  Negative for cough, chest tightness and shortness of breath.   Cardiovascular:  Negative for chest pain and palpitations.  Gastrointestinal:  Negative for abdominal  pain, diarrhea, nausea and vomiting.  Genitourinary:  Negative for difficulty urinating and dysuria.  Musculoskeletal:  Positive for back pain. Negative for myalgias.  Skin:  Negative for color change and rash.  Neurological:  Negative for dizziness and headaches.  Psychiatric/Behavioral:  Negative for agitation and dysphoric mood.        Objective:     BP 128/72   Pulse 65   Temp 97.9 F (36.6 C)   Resp 16   Ht 5' 4 (1.626 m)   Wt 154 lb (69.9 kg)   SpO2 98%   BMI 26.43 kg/m  Wt Readings from Last 3 Encounters:  08/01/23 154 lb (69.9 kg)  07/29/23 150 lb (68 kg)  02/21/23 154 lb 9.6 oz (70.1 kg)    Physical Exam Vitals reviewed.  Constitutional:      General: She is not in acute distress.    Appearance: Normal appearance.  HENT:     Head: Normocephalic and atraumatic.     Right Ear: External ear normal.     Left Ear: External ear normal.  Eyes:     General: No scleral icterus.       Right eye: No discharge.        Left eye: No discharge.     Conjunctiva/sclera: Conjunctivae normal.  Neck:     Thyroid : No thyromegaly.  Cardiovascular:     Rate and Rhythm: Normal rate and regular rhythm.  Pulmonary:     Effort: No respiratory distress.     Breath sounds: Normal breath sounds. No wheezing.  Abdominal:     General: Bowel sounds are normal.     Palpations: Abdomen is soft.     Tenderness: There is no abdominal tenderness.  Musculoskeletal:        General: No swelling.     Cervical back: Neck supple. No tenderness.     Comments: Increased pain with going from sitting to standing. Some increased lower back pain with certain position changes - notices after bending - more when straightening her back and rotation to left. Increased soft tissue fullness - left lower back - easily movable.   Lymphadenopathy:     Cervical: No cervical adenopathy.  Skin:    Findings: No erythema or rash.  Neurological:     Mental Status: She is alert.  Psychiatric:        Mood and  Affect: Mood normal.        Behavior: Behavior normal.         Outpatient Encounter Medications as of 08/01/2023  Medication Sig   amLODipine  (NORVASC ) 10 MG tablet Take 1 tablet (10 mg total) by mouth daily.   aspirin EC 81  MG tablet Take 81 mg by mouth daily.   atenolol  (TENORMIN ) 50 MG tablet TAKE 1 TABLET BY MOUTH DAILY   azelastine  (ASTELIN ) 0.1 % nasal spray Place 1 spray into both nostrils 2 (two) times daily. Use in each nostril as directed   fluocinonide  (LIDEX ) 0.05 % external solution Apply 1 Application topically 2 (two) times daily.   potassium chloride  (KLOR-CON ) 10 MEQ tablet TAKE 1 TABLET BY MOUTH DAILY   rosuvastatin  (CRESTOR ) 20 MG tablet TAKE 1 TABLET BY MOUTH DAILY   valsartan -hydrochlorothiazide  (DIOVAN -HCT) 160-12.5 MG tablet Take 1 tablet by mouth daily.   No facility-administered encounter medications on file as of 08/01/2023.     Lab Results  Component Value Date   WBC 9.8 02/26/2023   HGB 13.9 02/26/2023   HCT 42.2 02/26/2023   PLT 299.0 02/26/2023   GLUCOSE 107 (H) 02/19/2023   CHOL 165 02/19/2023   TRIG 122.0 02/19/2023   HDL 53.70 02/19/2023   LDLDIRECT 143.3 06/11/2013   LDLCALC 87 02/19/2023   ALT 18 02/19/2023   AST 20 02/19/2023   NA 141 02/19/2023   K 3.9 02/19/2023   CL 103 02/19/2023   CREATININE 1.08 02/19/2023   BUN 18 02/19/2023   CO2 29 02/19/2023   TSH 1.05 07/25/2021   HGBA1C 5.7 02/19/2023    MM 3D SCREEN BREAST BILATERAL Result Date: 08/16/2022 CLINICAL DATA:  Screening. EXAM: DIGITAL SCREENING BILATERAL MAMMOGRAM WITH TOMOSYNTHESIS AND CAD TECHNIQUE: Bilateral screening digital craniocaudal and mediolateral oblique mammograms were obtained. Bilateral screening digital breast tomosynthesis was performed. The images were evaluated with computer-aided detection. COMPARISON:  Previous exam(s). ACR Breast Density Category c: The breasts are heterogeneously dense, which may obscure small masses. FINDINGS: There are no findings  suspicious for malignancy. IMPRESSION: No mammographic evidence of malignancy. A result letter of this screening mammogram will be mailed directly to the patient. RECOMMENDATION: Screening mammogram in one year. (Code:SM-B-01Y) BI-RADS CATEGORY  1: Negative. Electronically Signed   By: Reyes Phi M.D.   On: 08/16/2022 16:01       Assessment & Plan:  Encounter for screening mammogram for malignant neoplasm of breast -     3D Screening Mammogram, Left and Right; Future  Left-sided low back pain without sciatica, unspecified chronicity Assessment & Plan: Back discomfort as outlined. Persistent intermittent pain. No radicular symptoms. Increased soft tissue mass as outlined. Pain worse with standing and certain position changes as outlined. Easily movable soft tissue fullness lower back. Will check L-S spine xray. Further w/up pending results. Continue tylenol.   Orders: -     DG Lumbar Spine 2-3 Views; Future  Essential hypertension Assessment & Plan: Blood pressure as outlined.  Continue losartan /hctz, atenolol  and amlodipine .  Follow pressures.       Allena Hamilton, MD

## 2023-08-04 ENCOUNTER — Other Ambulatory Visit: Payer: Self-pay | Admitting: Internal Medicine

## 2023-08-04 ENCOUNTER — Encounter: Payer: Self-pay | Admitting: Internal Medicine

## 2023-08-04 NOTE — Assessment & Plan Note (Signed)
 Blood pressure as outlined.  Continue losartan /hctz, atenolol  and amlodipine .  Follow pressures.

## 2023-08-04 NOTE — Assessment & Plan Note (Signed)
 Back discomfort as outlined. Persistent intermittent pain. No radicular symptoms. Increased soft tissue mass as outlined. Pain worse with standing and certain position changes as outlined. Easily movable soft tissue fullness lower back. Will check L-S spine xray. Further w/up pending results. Continue tylenol.

## 2023-08-06 ENCOUNTER — Other Ambulatory Visit: Payer: Self-pay | Admitting: Internal Medicine

## 2023-08-10 ENCOUNTER — Other Ambulatory Visit: Payer: Self-pay | Admitting: Internal Medicine

## 2023-08-10 DIAGNOSIS — M545 Low back pain, unspecified: Secondary | ICD-10-CM

## 2023-08-10 NOTE — Progress Notes (Signed)
Order placed for ortho referral.   

## 2023-08-18 ENCOUNTER — Other Ambulatory Visit: Payer: Self-pay | Admitting: Internal Medicine

## 2023-08-19 DIAGNOSIS — M5416 Radiculopathy, lumbar region: Secondary | ICD-10-CM | POA: Diagnosis not present

## 2023-08-28 DIAGNOSIS — M5451 Vertebrogenic low back pain: Secondary | ICD-10-CM | POA: Diagnosis not present

## 2023-09-06 ENCOUNTER — Ambulatory Visit
Admission: RE | Admit: 2023-09-06 | Discharge: 2023-09-06 | Disposition: A | Discharge status: Home / Self Care | Source: Ambulatory Visit | Attending: Internal Medicine | Admitting: Internal Medicine

## 2023-09-06 DIAGNOSIS — Z1231 Encounter for screening mammogram for malignant neoplasm of breast: Secondary | ICD-10-CM

## 2023-09-06 DIAGNOSIS — Z780 Asymptomatic menopausal state: Secondary | ICD-10-CM | POA: Insufficient documentation

## 2023-09-06 DIAGNOSIS — M8589 Other specified disorders of bone density and structure, multiple sites: Secondary | ICD-10-CM | POA: Diagnosis not present

## 2023-09-24 ENCOUNTER — Encounter: Payer: Self-pay | Admitting: Internal Medicine

## 2023-09-24 ENCOUNTER — Ambulatory Visit (INDEPENDENT_AMBULATORY_CARE_PROVIDER_SITE_OTHER): Payer: Medicare Other | Admitting: Internal Medicine

## 2023-09-24 VITALS — BP 110/68 | HR 64 | Temp 98.0°F | Resp 16 | Ht 64.0 in | Wt 153.2 lb

## 2023-09-24 DIAGNOSIS — N1831 Chronic kidney disease, stage 3a: Secondary | ICD-10-CM

## 2023-09-24 DIAGNOSIS — R739 Hyperglycemia, unspecified: Secondary | ICD-10-CM | POA: Diagnosis not present

## 2023-09-24 DIAGNOSIS — Z0000 Encounter for general adult medical examination without abnormal findings: Secondary | ICD-10-CM

## 2023-09-24 DIAGNOSIS — E7800 Pure hypercholesterolemia, unspecified: Secondary | ICD-10-CM | POA: Diagnosis not present

## 2023-09-24 DIAGNOSIS — I10 Essential (primary) hypertension: Secondary | ICD-10-CM | POA: Diagnosis not present

## 2023-09-24 DIAGNOSIS — I129 Hypertensive chronic kidney disease with stage 1 through stage 4 chronic kidney disease, or unspecified chronic kidney disease: Secondary | ICD-10-CM | POA: Diagnosis not present

## 2023-09-24 DIAGNOSIS — M545 Low back pain, unspecified: Secondary | ICD-10-CM

## 2023-09-24 DIAGNOSIS — E049 Nontoxic goiter, unspecified: Secondary | ICD-10-CM

## 2023-09-24 DIAGNOSIS — F439 Reaction to severe stress, unspecified: Secondary | ICD-10-CM

## 2023-09-24 DIAGNOSIS — I479 Paroxysmal tachycardia, unspecified: Secondary | ICD-10-CM

## 2023-09-24 LAB — LIPID PANEL
Cholesterol: 170 mg/dL (ref 0–200)
HDL: 64.5 mg/dL (ref >39.00)
LDL Cholesterol: 85 mg/dL (ref 0–99)
NonHDL: 105.5
Total CHOL/HDL Ratio: 3
Triglycerides: 101 mg/dL (ref 0.0–149.0)
VLDL: 20.2 mg/dL (ref 0.0–40.0)

## 2023-09-24 LAB — HEPATIC FUNCTION PANEL
ALT: 16 U/L (ref 0–35)
AST: 21 U/L (ref 0–37)
Albumin: 4.5 g/dL (ref 3.5–5.2)
Alkaline Phosphatase: 51 U/L (ref 39–117)
Bilirubin, Direct: 0.1 mg/dL (ref 0.0–0.3)
Total Bilirubin: 0.6 mg/dL (ref 0.2–1.2)
Total Protein: 7.7 g/dL (ref 6.0–8.3)

## 2023-09-24 LAB — BASIC METABOLIC PANEL WITH GFR
BUN: 14 mg/dL (ref 6–23)
CO2: 28 meq/L (ref 19–32)
Calcium: 10.4 mg/dL (ref 8.4–10.5)
Chloride: 102 meq/L (ref 96–112)
Creatinine, Ser: 1.08 mg/dL (ref 0.40–1.20)
GFR: 50.39 mL/min — ABNORMAL LOW (ref >60.00)
Glucose, Bld: 103 mg/dL — ABNORMAL HIGH (ref 70–99)
Potassium: 3.4 meq/L — ABNORMAL LOW (ref 3.5–5.1)
Sodium: 140 meq/L (ref 135–145)

## 2023-09-24 LAB — HEMOGLOBIN A1C: Hgb A1c MFr Bld: 5.6 % (ref 4.6–6.5)

## 2023-09-24 LAB — TSH: TSH: 5.7 u[IU]/mL — ABNORMAL HIGH (ref 0.35–5.50)

## 2023-09-24 MED ORDER — POTASSIUM CHLORIDE ER 10 MEQ PO TBCR: 10.0000 meq | EXTENDED_RELEASE_TABLET | Freq: Every day | ORAL | 1 refills | Status: DC

## 2023-09-24 MED ORDER — AZELASTINE HCL 137 MCG/SPRAY NA SOLN: NASAL | 1 refills | Status: AC

## 2023-09-24 MED ORDER — ROSUVASTATIN CALCIUM 20 MG PO TABS: 20.0000 mg | ORAL_TABLET | Freq: Every day | ORAL | 2 refills | Status: AC

## 2023-09-24 MED ORDER — VALSARTAN-HYDROCHLOROTHIAZIDE 160-12.5 MG PO TABS: 1.0000 | ORAL_TABLET | Freq: Every day | ORAL | 1 refills | Status: DC

## 2023-09-24 MED ORDER — AMLODIPINE BESYLATE 10 MG PO TABS: 10.0000 mg | ORAL_TABLET | Freq: Every day | ORAL | 1 refills | Status: DC

## 2023-09-24 NOTE — Assessment & Plan Note (Signed)
On atenolol.  No increased heart rate or palpitations.  Follow.   

## 2023-09-24 NOTE — Assessment & Plan Note (Signed)
 On crestor.  Low cholesterol diet and exercise.  Follow lipid panel and liver function tests.  Check labs today.  Lab Results  Component Value Date   CHOL 165 02/19/2023   HDL 53.70 02/19/2023   LDLCALC 87 02/19/2023   LDLDIRECT 143.3 06/11/2013   TRIG 122.0 02/19/2023   CHOLHDL 3 02/19/2023

## 2023-09-24 NOTE — Assessment & Plan Note (Signed)
 Increased stress - discussed. Has good support. Will notify me if feels needs further intervention.

## 2023-09-24 NOTE — Assessment & Plan Note (Signed)
 Back pain - better. Doing home exercise. Follow.

## 2023-09-24 NOTE — Assessment & Plan Note (Signed)
 Avoid antiinflammatories.  Stay hydrated.  On losartan. Have discussed renal ultrasound. Desired to hold. Schedule metabolic panel.

## 2023-09-24 NOTE — Assessment & Plan Note (Signed)
 Blood pressure as outlined.  Continue losartan/hctz, atenolol and amlodipine.  Follow pressures. Check metabolic panel today.

## 2023-09-24 NOTE — Assessment & Plan Note (Signed)
 She is s/p right thyroid lobectomy (9/1/123) for enlarging right thyroid nodule.  Pathology - benign.  Recommended f/u - Dr Tedd Sias - lab recheck. .  Last seen 06/06/22 - stable. No f/u recommended. Check tsh today.

## 2023-09-24 NOTE — Progress Notes (Signed)
 Subjective:    Patient ID: Cheyenne Baker, female    DOB: 21-Apr-1951, 73 y.o.   MRN: [id number]   HPI With past history of hypercholesterolemia and hypertension, she comes in today to follow up on these issues as well as for a complete physical exam. Last visit - increased back pain. Saw ortho. Xray - lower lumbar spondylosis. Saw ortho 08/19/23 - recommended PT. She went to PT x 1. Has been doing exercises. Back is doing better. Increased stress with her fiance's health concerns. Discussed. Has good support. Breathing stable. No bowel concerns.    Past Medical History:  Diagnosis Date   Allergy    Diverticulosis    Enlarged thyroid    Hyperlipidemia    Hypertension    Paroxysmal tachycardia (HCC)    Right carotid bruit    Past Surgical History:  Procedure Laterality Date   ABDOMINAL HYSTERECTOMY  06/25/1978   ovaries not removed, secondary to bleeding   CHOLECYSTECTOMY  06/25/1993   COLONOSCOPY     THYROID SURGERY  2024   TONSILLECTOMY AND ADENOIDECTOMY  06/25/1973   Family History  Problem Relation Age of Onset   Hypertension Mother    Hyperlipidemia Mother    Heart disease Father    Hypertension Father    Breast cancer Daughter 28   Colon cancer Neg Hx    Colon polyps Neg Hx    Rectal cancer Neg Hx    Esophageal cancer Neg Hx    Stomach cancer Neg Hx    Social History   Socioeconomic History   Marital status: Widowed    Spouse name: Not on file   Number of children: Not on file   Years of education: Not on file   Highest education level: 12th grade  Occupational History   Not on file  Tobacco Use   Smoking status: Former    Current packs/day: 0.50    Average packs/day: 0.5 packs/day for 20.0 years (10.0 ttl pk-yrs)    Types: Cigarettes   Smokeless tobacco: Never  Vaping Use   Vaping status: Never Used  Substance and Sexual Activity   Alcohol use: Yes    Alcohol/week: 0.0 standard drinks of alcohol    Comment: occasionally   Drug use: No   Sexual  activity: Yes  Other Topics Concern   Not on file  Social History Narrative   Not on file   Social Drivers of Health   Financial Resource Strain: Low Risk  (07/29/2023)   Overall Financial Resource Strain (CARDIA)    Difficulty of Paying Living Expenses: Not hard at all  Food Insecurity: No Food Insecurity (07/29/2023)   Hunger Vital Sign    Worried About Running Out of Food in the Last Year: Never true    Ran Out of Food in the Last Year: Never true  Transportation Needs: No Transportation Needs (07/29/2023)   PRAPARE - Administrator, Civil Service (Medical): No    Lack of Transportation (Non-Medical): No  Physical Activity: Insufficiently Active (07/29/2023)   Exercise Vital Sign    Days of Exercise per Week: 3 days    Minutes of Exercise per Session: 40 min  Stress: No Stress Concern Present (07/29/2023)   [employer] of Occupational Health - Occupational Stress Questionnaire    Feeling of Stress : Not at all  Social Connections: Moderately Integrated (07/29/2023)   Social Connection and Isolation Panel [NHANES]    Frequency of Communication with Friends and Family: More than three times a  week    Frequency of Social Gatherings with Friends and Family: Three times a week    Attends Religious Services: More than 4 times per year    Active Member of Clubs or Organizations: Yes    Attends Banker Meetings: More than 4 times per year    Marital Status: Widowed     Review of Systems  Constitutional:  Negative for appetite change and unexpected weight change.  HENT:  Negative for congestion and sinus pressure.   Respiratory:  Negative for cough, chest tightness and shortness of breath.   Cardiovascular:  Negative for chest pain, palpitations and leg swelling.  Gastrointestinal:  Negative for abdominal pain, diarrhea, nausea and vomiting.  Genitourinary:  Negative for difficulty urinating and dysuria.  Musculoskeletal:  Negative for joint swelling and  myalgias.  Skin:  Negative for color change and rash.  Neurological:  Negative for dizziness and headaches.  Psychiatric/Behavioral:  Negative for agitation and dysphoric mood.        Increased stress.        Objective:     BP 110/68   Pulse 64   Temp 98 F (36.7 C)   Resp 16   Ht 5\' 4"  (1.626 m)   Wt 153 lb 3.2 oz (69.5 kg)   SpO2 99%   BMI 26.30 kg/m  Wt Readings from Last 3 Encounters:  09/24/23 153 lb 3.2 oz (69.5 kg)  08/01/23 154 lb (69.9 kg)  07/29/23 150 lb (68 kg)    Physical Exam Vitals reviewed.  Constitutional:      General: She is not in acute distress.    Appearance: Normal appearance.  HENT:     Head: Normocephalic and atraumatic.     Right Ear: External ear normal.     Left Ear: External ear normal.     Mouth/Throat:     Pharynx: No oropharyngeal exudate or posterior oropharyngeal erythema.  Eyes:     General: No scleral icterus.       Right eye: No discharge.        Left eye: No discharge.     Conjunctiva/sclera: Conjunctivae normal.  Neck:     Thyroid: No thyromegaly.  Cardiovascular:     Rate and Rhythm: Normal rate and regular rhythm.  Pulmonary:     Effort: No respiratory distress.     Breath sounds: Normal breath sounds. No wheezing.  Abdominal:     General: Bowel sounds are normal.     Palpations: Abdomen is soft.     Tenderness: There is no abdominal tenderness.  Musculoskeletal:        General: No swelling or tenderness.     Cervical back: Neck supple. No tenderness.  Lymphadenopathy:     Cervical: No cervical adenopathy.  Skin:    Findings: No erythema or rash.  Neurological:     Mental Status: She is alert.  Psychiatric:        Mood and Affect: Mood normal.        Behavior: Behavior normal.         Outpatient Encounter Medications as of 09/24/2023  Medication Sig   amLODipine (NORVASC) 10 MG tablet Take 1 tablet (10 mg total) by mouth daily.   aspirin EC 81 MG tablet Take 81 mg by mouth daily.   atenolol (TENORMIN)  50 MG tablet TAKE 1 TABLET BY MOUTH DAILY   Azelastine HCl 137 MCG/SPRAY SOLN SPRAY 1 SPRAY IN EACH NOSTRIL TWICE DAILY   fluocinonide (LIDEX) 0.05 % external  solution Apply 1 Application topically 2 (two) times daily.   potassium chloride (KLOR-CON) 10 MEQ tablet Take 1 tablet (10 mEq total) by mouth daily.   rosuvastatin (CRESTOR) 20 MG tablet Take 1 tablet (20 mg total) by mouth daily.   valsartan-hydrochlorothiazide (DIOVAN-HCT) 160-12.5 MG tablet Take 1 tablet by mouth daily.   [DISCONTINUED] amLODipine (NORVASC) 10 MG tablet TAKE 1 TABLET BY MOUTH DAILY   [DISCONTINUED] Azelastine HCl 137 MCG/SPRAY SOLN SPRAY 1 SPRAY IN EACH NOSTRIL TWICE DAILY   [DISCONTINUED] potassium chloride (KLOR-CON) 10 MEQ tablet TAKE 1 TABLET BY MOUTH DAILY   [DISCONTINUED] rosuvastatin (CRESTOR) 20 MG tablet TAKE 1 TABLET BY MOUTH DAILY   [DISCONTINUED] valsartan-hydrochlorothiazide (DIOVAN-HCT) 160-12.5 MG tablet TAKE 1 TABLET BY MOUTH DAILY   No facility-administered encounter medications on file as of 09/24/2023.     Lab Results  Component Value Date   WBC 9.8 02/26/2023   HGB 13.9 02/26/2023   HCT 42.2 02/26/2023   PLT 299.0 02/26/2023   GLUCOSE 107 (H) 02/19/2023   CHOL 165 02/19/2023   TRIG 122.0 02/19/2023   HDL 53.70 02/19/2023   LDLDIRECT 143.3 06/11/2013   LDLCALC 87 02/19/2023   ALT 18 02/19/2023   AST 20 02/19/2023   NA 141 02/19/2023   K 3.9 02/19/2023   CL 103 02/19/2023   CREATININE 1.08 02/19/2023   BUN 18 02/19/2023   CO2 29 02/19/2023   TSH 1.05 07/25/2021   HGBA1C 5.7 02/19/2023    MM 3D SCREENING MAMMOGRAM BILATERAL BREAST Result Date: 09/10/2023 CLINICAL DATA:  Screening. EXAM: DIGITAL SCREENING BILATERAL MAMMOGRAM WITH TOMOSYNTHESIS AND CAD TECHNIQUE: Bilateral screening digital craniocaudal and mediolateral oblique mammograms were obtained. Bilateral screening digital breast tomosynthesis was performed. The images were evaluated with computer-aided detection. COMPARISON:   Previous exam(s). ACR Breast Density Category c: The breasts are heterogeneously dense, which may obscure small masses. FINDINGS: There are no findings suspicious for malignancy. IMPRESSION: No mammographic evidence of malignancy. A result letter of this screening mammogram will be mailed directly to the patient. RECOMMENDATION: Screening mammogram in one year. (Code:SM-B-01Y) BI-RADS CATEGORY  1: Negative. Electronically Signed   By: Elberta Fortis M.D.   On: 09/10/2023 09:41   DG Bone Density Result Date: 09/06/2023 EXAM: DUAL X-RAY ABSORPTIOMETRY (DXA) FOR BONE MINERAL DENSITY IMPRESSION: Dear Dr. Lorin Picket, Your patient Yancy Hascall Sennett completed a FRAX assessment on 09/06/2023 using the Lunar iDXA DXA System (analysis version: 14.10) manufactured by [employer]. The following summarizes the results of our evaluation. PATIENT BIOGRAPHICAL: Name: Charnise, Lovan Patient ID: [id number] Birth Date: 1951-06-02 Height:    64.0 in. Gender:     Female    [age]       Weight:    153.3 lbs. Ethnicity:  White                            Exam Date: 09/06/2023 FRAX* RESULTS:  (version: 3.5) 10-year Probability of Fracture1 Major Osteoporotic Fracture2 Hip Fracture 10.0% 1.6% Population: [place] (Caucasian) Risk Factors: None Based on Femur (Left) Neck BMD 1 -The 10-year probability of fracture may be lower than reported if the patient has received treatment. 2 -Major Osteoporotic Fracture: Clinical Spine, Forearm, Hip or Shoulder *FRAX is a Armed forces logistics/support/administrative officer of the [employer] of [employer] for Metabolic Bone Disease, a World Science writer (WHO) [employer]. ASSESSMENT: The probability of a major osteoporotic fracture is 10.0% within the next ten  years. The probability of a hip fracture is 1.6% within the next ten years. . Your patient Nayra Coury completed a BMD test on 09/06/2023 using the [employer] DXA System (software version: 14.10) manufactured by [employer]. The  following summarizes the results of our evaluation. Technologist: [facility] PATIENT BIOGRAPHICAL: Name: Kienna, Moncada Patient ID: [id number] Birth Date: [birth year] Height: 64.0 in. Gender: Female Exam Date: 09/06/2023 Weight: 153.3 lbs. Indications: Advanced Age, Caucasian, Height Loss, Hysterectomy, Postmenopausal Fractures: Treatments: DENSITOMETRY RESULTS: Site      Region     Measured Date Measured Age WHO Classification Young Adult T-score BMD         %Change vs. Previous Significant Change (*) AP Spine L1-L2 09/06/2021 75.0 Normal 0.1 1.185 g/cm2 9.9% Yes AP Spine L1-L2 10/02/2016 68.1 Normal -0.8 1.078 g/cm2 8.1% Yes AP Spine L1-L2 10/16/2007 59.1 Osteopenia -1.5 0.997 g/cm2 -3.4% - AP Spine L1-L2 10/16/2007 59.1 Osteopenia -1.2 1.032 g/cm2 - - DualFemur Neck Left 09/06/2023 75.0 Osteopenia -1.1 0.888 g/cm2 -3.5% - DualFemur Neck Left 10/02/2016 68.1 Normal -0.8 0.920 g/cm2 7.4% Yes DualFemur Neck Left 10/16/2007 59.1 Osteopenia -1.3 0.857 g/cm2 1.2% - DualFemur Neck Left 10/16/2007 59.1 Osteopenia -1.4 0.847 g/cm2 - - DualFemur Total Mean 09/06/2023 75.0 Normal -0.5 0.948 g/cm2 -0.5% - DualFemur Total Mean 10/02/2016 68.1 Normal -0.4 0.953 g/cm2 2.9% Yes DualFemur Total Mean 10/16/2007 59.1 Normal -0.6 0.926 g/cm2 -0.2% - DualFemur Total Mean 10/16/2007 59.1 Normal -0.6 0.928 g/cm2 - - ASSESSMENT: The BMD measured at Femur Neck Left is 0.888 g/cm2 with a T-score of -1.1. This patient is considered osteopenic according to World Health Organization [facility]) criteria. The scan quality is good. L-3 & 4 was excluded due to degenerative changes. Compared with prior study, there has been significant increase in the spine. Compared with prior study, there has been no significant change in the total hip. World Science writer [facility]) criteria for post-menopausal, Caucasian Women: Normal:                   T-score at or above -1 SD Osteopenia/low bone mass: T-score between -1 and -2.5 SD Osteoporosis:             T-score at  or below -2.5 SD RECOMMENDATIONS: 1. All patients should optimize calcium and vitamin D intake. 2. Consider FDA-approved medical therapies in postmenopausal women and men aged 68 years and older, based on the following: a. A hip or vertebral(clinical or morphometric) fracture b. T-score < -2.5 at the femoral neck or spine after appropriate evaluation to exclude secondary causes c. Low bone mass (T-score between -1.0 and -2.5 at the femoral neck or spine) and a 10-year probability of a hip fracture > 3% or a 10-year probability of a major osteoporosis-related fracture > 20% based on the [place]-adapted WHO algorithm 3. Clinician judgment and/or patient preferences may indicate treatment for people with 10-year fracture probabilities above or below these levels FOLLOW-UP: People with diagnosed cases of osteoporosis or at high risk for fracture should have regular bone mineral density tests. For patients eligible for Medicare, routine testing is allowed once every 2 years. The testing frequency can be increased to one year for patients who have rapidly progressing disease, those who are receiving or discontinuing medical therapy to restore bone mass, or have additional risk factors. I have reviewed this report, and agree with the above findings. [facility] Electronically Signed   By: Baird Lyons M.D.   On: 09/06/2023 09:49  Assessment & Plan:  Health care maintenance Assessment & Plan: Physical today 09/24/23.  Mammogram 09/10/23 - Birads I. .  Colonoscopy 08/2021.  No f/u colonoscopy recommended.    Essential hypertension Assessment & Plan: Blood pressure as outlined.  Continue losartan/hctz, atenolol and amlodipine.  Follow pressures. Check metabolic panel today.   Orders: -     TSH -     Basic metabolic panel with GFR  Hyperglycemia Assessment & Plan: Low carb diet and exercise.  Check met b and A1c today.   Orders: -     Hemoglobin A1c  Hypercholesterolemia Assessment &  Plan: On crestor.  Low cholesterol diet and exercise.  Follow lipid panel and liver function tests.  Check labs today.  Lab Results  Component Value Date   CHOL 165 02/19/2023   HDL 53.70 02/19/2023   LDLCALC 87 02/19/2023   LDLDIRECT 143.3 06/11/2013   TRIG 122.0 02/19/2023   CHOLHDL 3 02/19/2023    Orders: -     Hepatic function panel -     Lipid panel  Stage 3a chronic kidney disease (HCC) Assessment & Plan: Avoid antiinflammatories.  Stay hydrated.  On losartan. Have discussed renal ultrasound. Desired to hold. Schedule metabolic panel.     Enlarged thyroid Assessment & Plan: She is s/p right thyroid lobectomy (9/1/123) for enlarging right thyroid nodule.  Pathology - benign.  Recommended f/u - Dr Tedd Sias - lab recheck. .  Last seen 06/06/22 - stable. No f/u recommended. Check tsh today.    Left-sided low back pain without sciatica, unspecified chronicity Assessment & Plan: Back pain - better. Doing home exercise. Follow.    Paroxysmal tachycardia (HCC) Assessment & Plan: On atenolol.  No increased heart rate or palpitations.  Follow.     Stress Assessment & Plan: Increased stress - discussed. Has good support. Will notify me if feels needs further intervention.    Other orders -     amLODIPine Besylate; Take 1 tablet (10 mg total) by mouth daily.  Dispense: 90 tablet; Refill: 1 -     Azelastine HCl; SPRAY 1 SPRAY IN EACH NOSTRIL TWICE DAILY  Dispense: 30 mL; Refill: 1 -     Potassium Chloride ER; Take 1 tablet (10 mEq total) by mouth daily.  Dispense: 90 tablet; Refill: 1 -     Rosuvastatin Calcium; Take 1 tablet (20 mg total) by mouth daily.  Dispense: 90 tablet; Refill: 2 -     Valsartan-hydroCHLOROthiazide; Take 1 tablet by mouth daily.  Dispense: 90 tablet; Refill: 1     Dale Bemidji, MD

## 2023-09-24 NOTE — Assessment & Plan Note (Signed)
 Low carb diet and exercise.  Check met b and A1c today.

## 2023-09-24 NOTE — Assessment & Plan Note (Addendum)
 Physical today 09/24/23.  Mammogram 09/10/23 - Birads I. .  Colonoscopy 08/2021.  No f/u colonoscopy recommended.

## 2023-09-25 ENCOUNTER — Other Ambulatory Visit: Payer: Self-pay | Admitting: Internal Medicine

## 2023-09-25 DIAGNOSIS — R7989 Other specified abnormal findings of blood chemistry: Secondary | ICD-10-CM

## 2023-09-25 DIAGNOSIS — E876 Hypokalemia: Secondary | ICD-10-CM

## 2023-09-25 DIAGNOSIS — E7800 Pure hypercholesterolemia, unspecified: Secondary | ICD-10-CM

## 2023-09-25 NOTE — Progress Notes (Signed)
Orders placed for f/u labs.  

## 2023-10-23 ENCOUNTER — Other Ambulatory Visit (INDEPENDENT_AMBULATORY_CARE_PROVIDER_SITE_OTHER)

## 2023-10-23 DIAGNOSIS — E876 Hypokalemia: Secondary | ICD-10-CM

## 2023-10-23 DIAGNOSIS — E7800 Pure hypercholesterolemia, unspecified: Secondary | ICD-10-CM

## 2023-10-23 LAB — TSH: TSH: 4.47 u[IU]/mL (ref 0.35–5.50)

## 2023-10-23 LAB — T4, FREE: Free T4: 0.69 ng/dL (ref 0.60–1.60)

## 2023-10-23 LAB — POTASSIUM: Potassium: 3.8 meq/L (ref 3.5–5.1)

## 2023-10-24 ENCOUNTER — Other Ambulatory Visit: Payer: Self-pay | Admitting: Internal Medicine

## 2023-10-28 ENCOUNTER — Other Ambulatory Visit: Payer: Self-pay

## 2023-10-28 DIAGNOSIS — N1831 Chronic kidney disease, stage 3a: Secondary | ICD-10-CM

## 2023-11-21 ENCOUNTER — Other Ambulatory Visit: Payer: Self-pay

## 2023-11-21 ENCOUNTER — Telehealth: Payer: Self-pay

## 2023-11-21 MED ORDER — POTASSIUM CHLORIDE ER 10 MEQ PO TBCR: 10.0000 meq | EXTENDED_RELEASE_TABLET | Freq: Two times a day (BID) | ORAL | 1 refills | Status: DC

## 2023-11-21 NOTE — Telephone Encounter (Signed)
Rx has been sent-patient aware.

## 2023-11-21 NOTE — Telephone Encounter (Signed)
 Copied from CRM [id number]. Topic: Clinical - Prescription Issue >> Nov 21, 2023  8:55 AM Bambi Bonine D wrote: Reason for CRM: Pt stated that Dr.Scott changed her potassium chloride  (KLOR-CON ) 10 MEQ tablet from 1 tablet to 2 tablets and has to inform the pharmacy so they can update the medication.

## 2023-12-09 ENCOUNTER — Other Ambulatory Visit (INDEPENDENT_AMBULATORY_CARE_PROVIDER_SITE_OTHER)

## 2023-12-09 DIAGNOSIS — N1831 Chronic kidney disease, stage 3a: Secondary | ICD-10-CM

## 2023-12-09 LAB — BASIC METABOLIC PANEL WITH GFR
BUN: 15 mg/dL (ref 6–23)
CO2: 31 meq/L (ref 19–32)
Calcium: 10.5 mg/dL (ref 8.4–10.5)
Chloride: 101 meq/L (ref 96–112)
Creatinine, Ser: 0.95 mg/dL (ref 0.40–1.20)
GFR: 58.69 mL/min — ABNORMAL LOW (ref >60.00)
Glucose, Bld: 98 mg/dL (ref 70–99)
Potassium: 4.1 meq/L (ref 3.5–5.1)
Sodium: 138 meq/L (ref 135–145)

## 2023-12-10 ENCOUNTER — Ambulatory Visit: Payer: Self-pay | Admitting: Internal Medicine

## 2024-01-19 IMAGING — MG MM DIGITAL SCREENING BILAT W/ TOMO AND CAD
8 series · 9 of 24 positions shown · non-contrast
Comparison: Previous exam(s).

CLINICAL DATA: Screening.

EXAM:
DIGITAL SCREENING BILATERAL MAMMOGRAM WITH TOMOSYNTHESIS AND CAD
TECHNIQUE: Bilateral screening digital craniocaudal and mediolateral oblique
mammograms were obtained. Bilateral screening digital breast
tomosynthesis was performed. The images were evaluated with
computer-aided detection.

[R MLO synth-2D]
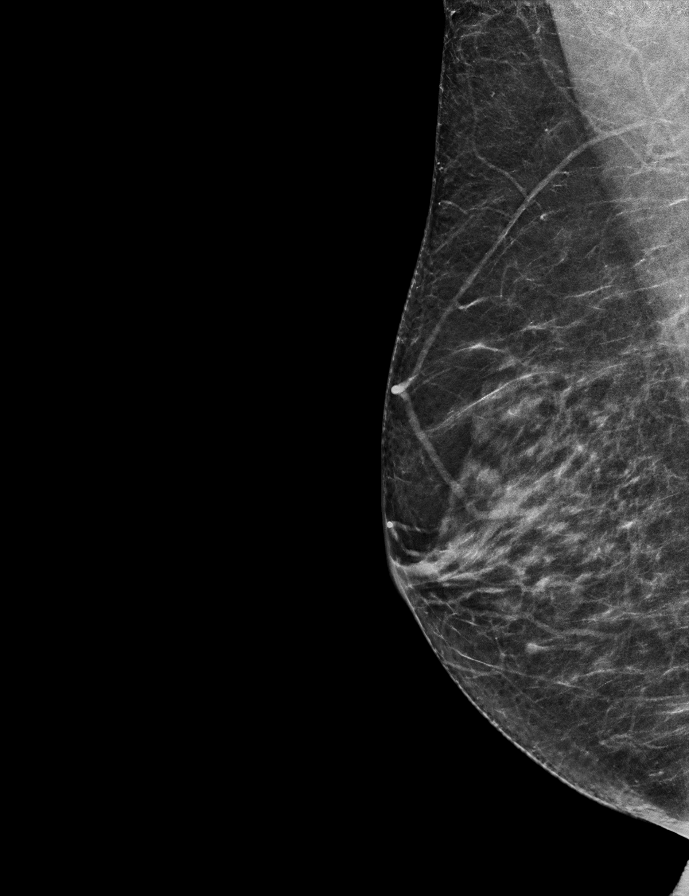

[L MLO synth-2D]
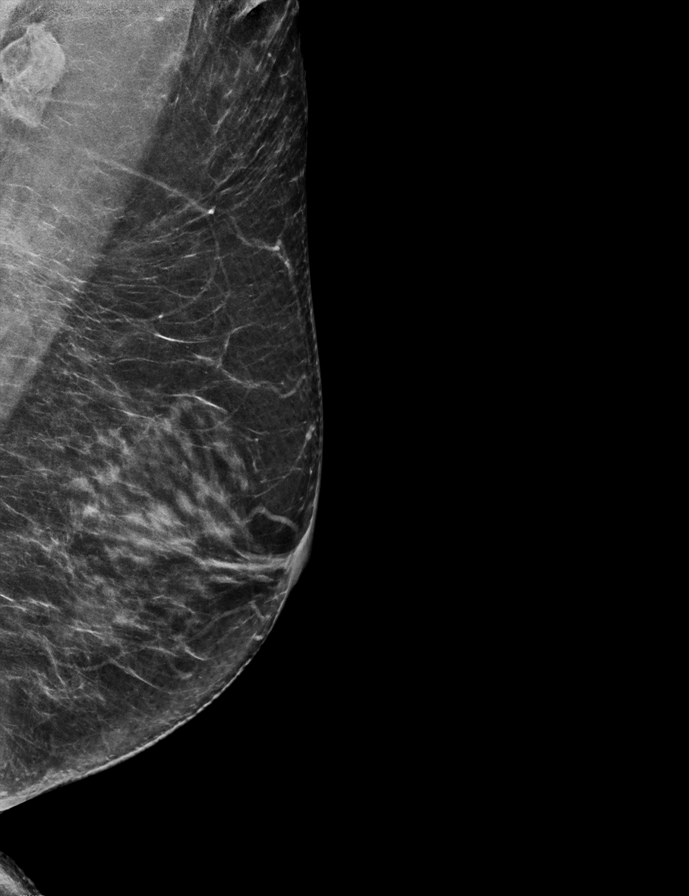

[R CC synth-2D]
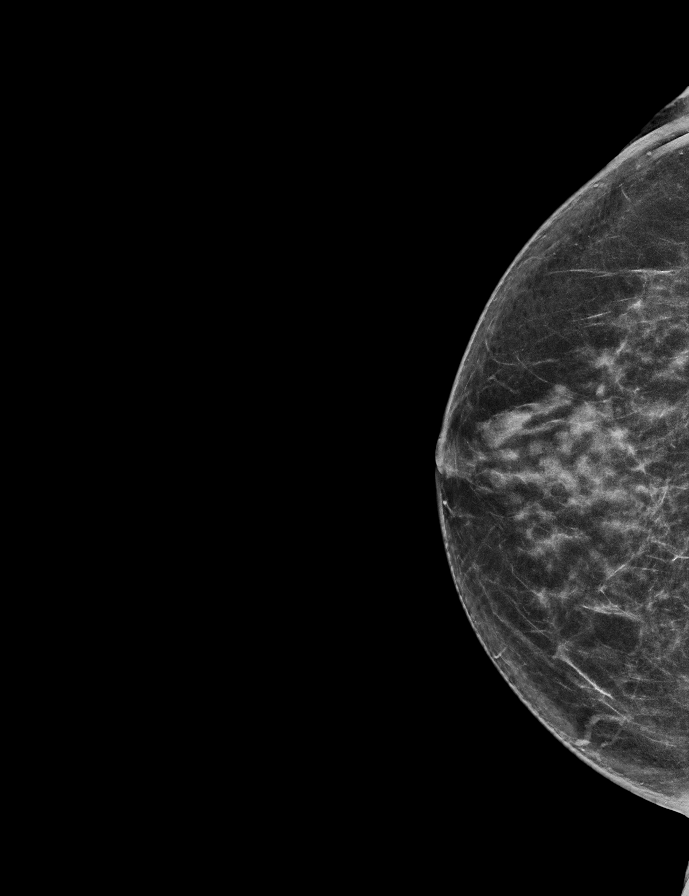

[L CC synth-2D]
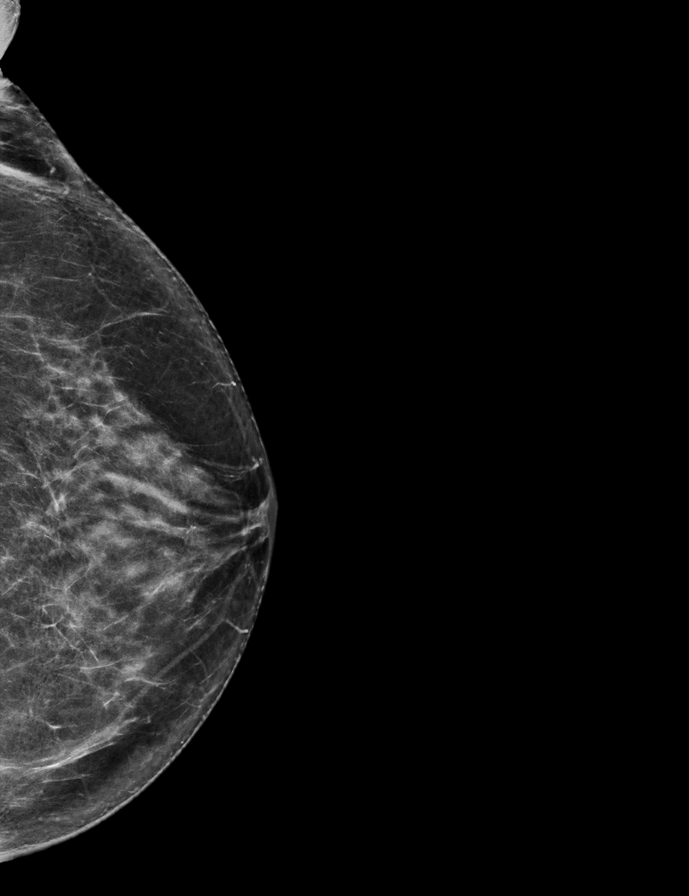

[L CC tomo · 2 of 66 frames shown]
[frame 22/66]
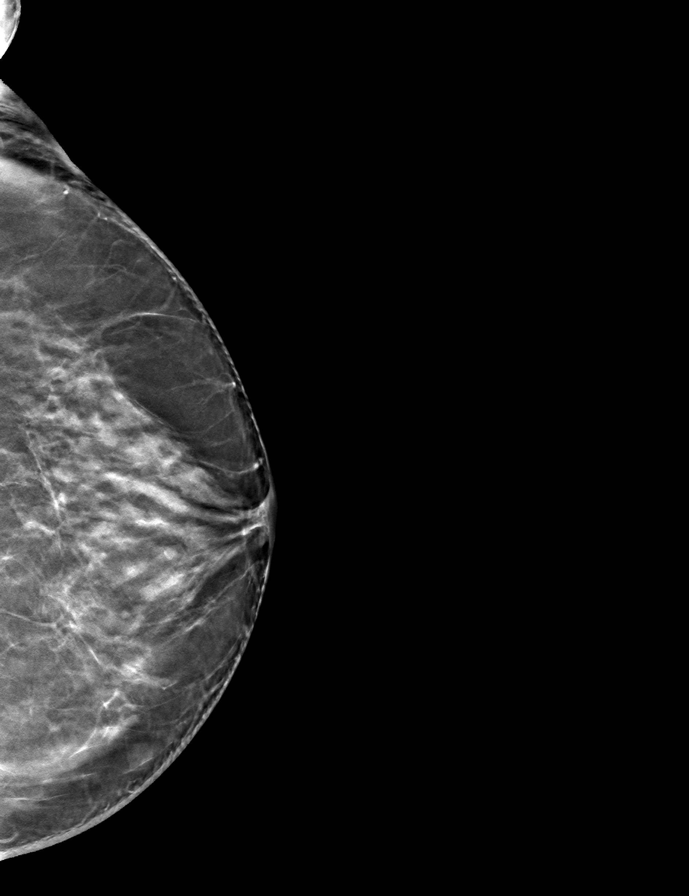
[frame 33/66]
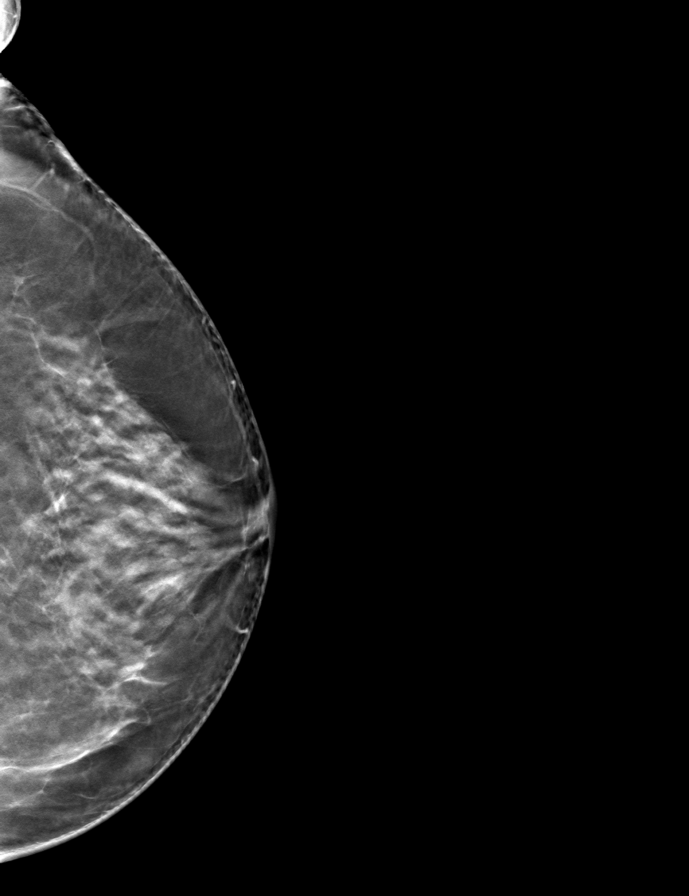

[L MLO tomo · tomo slice 34/67.0]
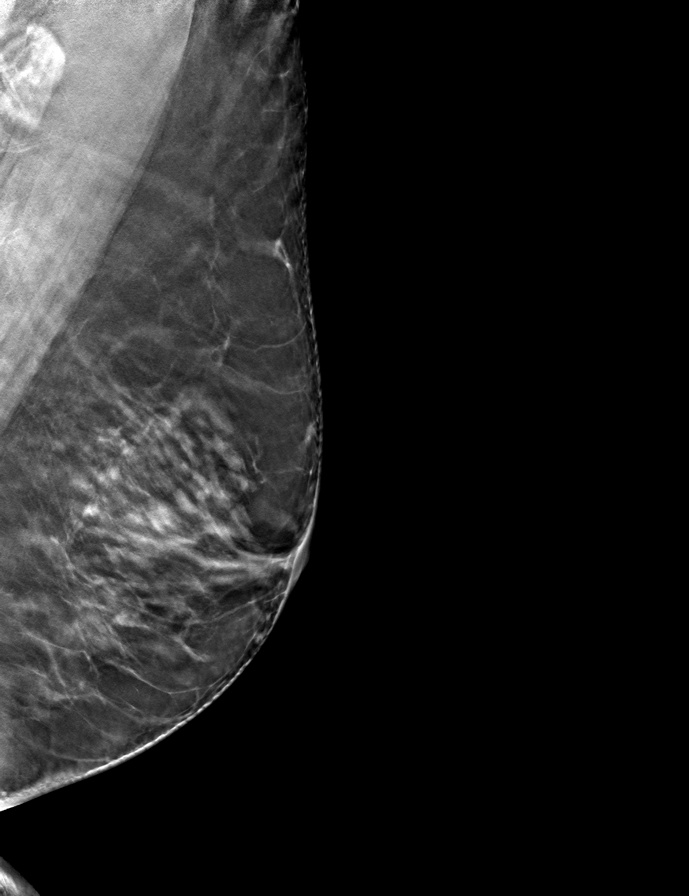

[R CC tomo · tomo slice 33/64.0]
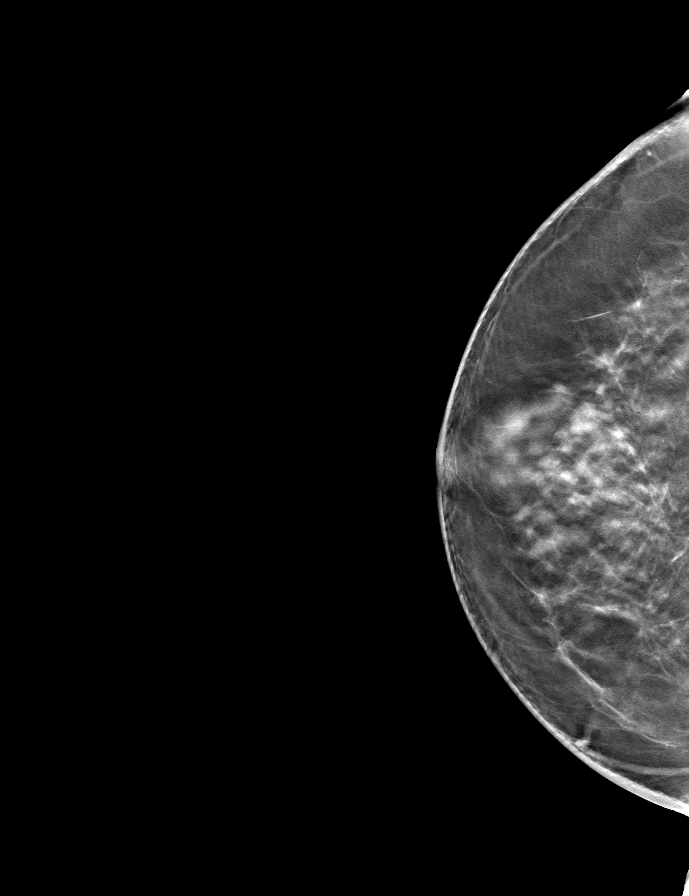

[R MLO tomo · tomo slice 30/59.0]
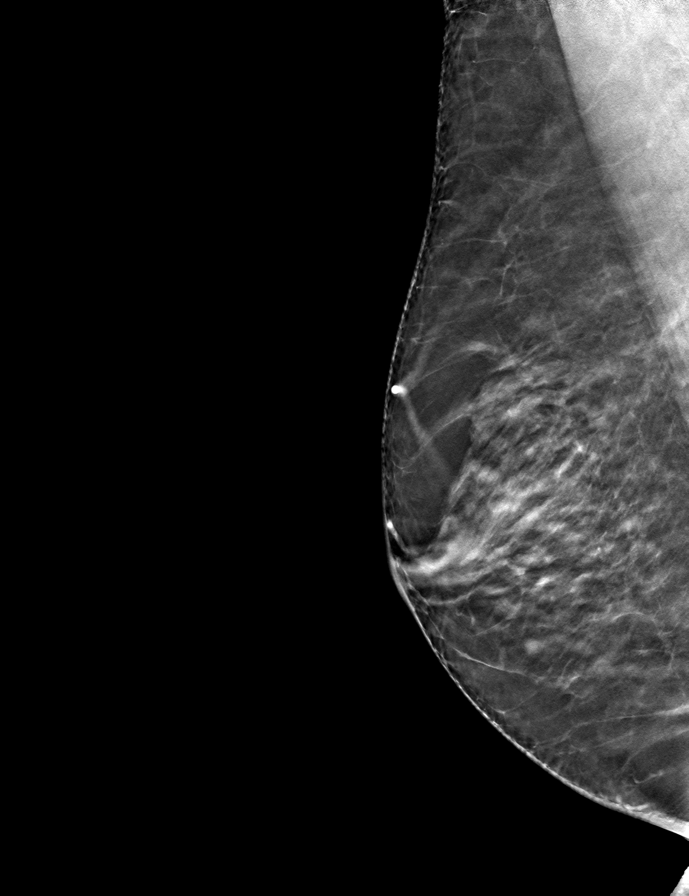

[9 of 24 positions shown; findings below may reference images not displayed]

ACR Breast Density Category c: The breast tissue is heterogeneously
dense, which may obscure small masses.
FINDINGS: There are no findings suspicious for malignancy.
IMPRESSION: No mammographic evidence of malignancy. A result letter of this
screening mammogram will be mailed directly to the patient.

RECOMMENDATION:
Screening mammogram in one year. (Code:Q3-W-BC3)

BI-RADS CATEGORY  1: Negative.

## 2024-04-10 ENCOUNTER — Ambulatory Visit: Admitting: Internal Medicine

## 2024-04-10 VITALS — BP 120/70 | HR 64 | Resp 16 | Ht 64.0 in | Wt 142.2 lb

## 2024-04-10 DIAGNOSIS — N1831 Chronic kidney disease, stage 3a: Secondary | ICD-10-CM | POA: Diagnosis not present

## 2024-04-10 DIAGNOSIS — I479 Paroxysmal tachycardia, unspecified: Secondary | ICD-10-CM | POA: Diagnosis not present

## 2024-04-10 DIAGNOSIS — E049 Nontoxic goiter, unspecified: Secondary | ICD-10-CM

## 2024-04-10 DIAGNOSIS — F439 Reaction to severe stress, unspecified: Secondary | ICD-10-CM | POA: Diagnosis not present

## 2024-04-10 DIAGNOSIS — I10 Essential (primary) hypertension: Secondary | ICD-10-CM | POA: Diagnosis not present

## 2024-04-10 DIAGNOSIS — E7800 Pure hypercholesterolemia, unspecified: Secondary | ICD-10-CM

## 2024-04-10 DIAGNOSIS — R739 Hyperglycemia, unspecified: Secondary | ICD-10-CM

## 2024-04-10 LAB — CBC WITH DIFFERENTIAL/PLATELET
Basophils Absolute: 0 K/uL (ref 0.0–0.1)
Basophils Relative: 0.3 % (ref 0.0–3.0)
Eosinophils Absolute: 0 K/uL (ref 0.0–0.7)
Eosinophils Relative: 0.2 % (ref 0.0–5.0)
HCT: 43.7 % (ref 36.0–46.0)
Hemoglobin: 14.5 g/dL (ref 12.0–15.0)
Lymphocytes Relative: 12.7 % (ref 12.0–46.0)
Lymphs Abs: 1.4 K/uL (ref 0.7–4.0)
MCHC: 33.1 g/dL (ref 30.0–36.0)
MCV: 92.3 fl (ref 78.0–100.0)
Monocytes Absolute: 0.5 K/uL (ref 0.1–1.0)
Monocytes Relative: 4.4 % (ref 3.0–12.0)
Neutro Abs: 9.3 K/uL — ABNORMAL HIGH (ref 1.4–7.7)
Neutrophils Relative %: 82.4 % — ABNORMAL HIGH (ref 43.0–77.0)
Platelets: 316 K/uL (ref 150.0–400.0)
RBC: 4.74 Mil/uL (ref 3.87–5.11)
RDW: 13.1 % (ref 11.5–15.5)
WBC: 11.2 K/uL — ABNORMAL HIGH (ref 4.0–10.5)

## 2024-04-10 LAB — HEMOGLOBIN A1C: Hgb A1c MFr Bld: 5.6 % (ref 4.6–6.5)

## 2024-04-10 LAB — BASIC METABOLIC PANEL WITH GFR
BUN: 13 mg/dL (ref 6–23)
CO2: 28 meq/L (ref 19–32)
Calcium: 10.3 mg/dL (ref 8.4–10.5)
Chloride: 100 meq/L (ref 96–112)
Creatinine, Ser: 1.02 mg/dL (ref 0.40–1.20)
GFR: 53.76 mL/min — ABNORMAL LOW (ref >60.00)
Glucose, Bld: 104 mg/dL — ABNORMAL HIGH (ref 70–99)
Potassium: 4 meq/L (ref 3.5–5.1)
Sodium: 137 meq/L (ref 135–145)

## 2024-04-10 LAB — LIPID PANEL
Cholesterol: 175 mg/dL (ref 0–200)
HDL: 65.3 mg/dL (ref >39.00)
LDL Cholesterol: 92 mg/dL (ref 0–99)
NonHDL: 110.12
Total CHOL/HDL Ratio: 3
Triglycerides: 92 mg/dL (ref 0.0–149.0)
VLDL: 18.4 mg/dL (ref 0.0–40.0)

## 2024-04-10 LAB — HEPATIC FUNCTION PANEL
ALT: 19 U/L (ref 0–35)
AST: 24 U/L (ref 0–37)
Albumin: 4.7 g/dL (ref 3.5–5.2)
Alkaline Phosphatase: 46 U/L (ref 39–117)
Bilirubin, Direct: 0.3 mg/dL (ref 0.0–0.3)
Total Bilirubin: 0.8 mg/dL (ref 0.2–1.2)
Total Protein: 8 g/dL (ref 6.0–8.3)

## 2024-04-10 NOTE — Progress Notes (Unsigned)
 Subjective:    Patient ID: Cheyenne Baker, female    DOB: 07-12-1950, 73 y.o.   MRN: [id number]  Patient here for  Chief Complaint  Patient presents with   Medical Management of Chronic Issues    HPI Here for a scheduled follow up - follow up regarding hypertension and hypercholesterolemia. Reports doing well. Stays active. No chest pain or sob reported. No abdominal pain or bowel change reported.    Past Medical History:  Diagnosis Date   Allergy    Diverticulosis    Enlarged thyroid     Hyperlipidemia    Hypertension    Paroxysmal tachycardia (HCC)    Right carotid bruit    Past Surgical History:  Procedure Laterality Date   ABDOMINAL HYSTERECTOMY  06/25/1978   ovaries not removed, secondary to bleeding   CHOLECYSTECTOMY  06/25/1993   COLONOSCOPY     THYROID  SURGERY  2024   TONSILLECTOMY AND ADENOIDECTOMY  06/25/1973   Family History  Problem Relation Age of Onset   Hypertension Mother    Hyperlipidemia Mother    Heart disease Father    Hypertension Father    Breast cancer Daughter 57   Colon cancer Neg Hx    Colon polyps Neg Hx    Rectal cancer Neg Hx    Esophageal cancer Neg Hx    Stomach cancer Neg Hx    Social History   Socioeconomic History   Marital status: Widowed    Spouse name: Not on file   Number of children: Not on file   Years of education: Not on file   Highest education level: 12th grade  Occupational History   Not on file  Tobacco Use   Smoking status: Former    Current packs/day: 0.50    Average packs/day: 0.5 packs/day for 20.0 years (10.0 ttl pk-yrs)    Types: Cigarettes   Smokeless tobacco: Never  Vaping Use   Vaping status: Never Used  Substance and Sexual Activity   Alcohol use: Yes    Alcohol/week: 0.0 standard drinks of alcohol    Comment: occasionally   Drug use: No   Sexual activity: Yes  Other Topics Concern   Not on file  Social History Narrative   Not on file   Social Drivers of Health   Financial Resource  Strain: Low Risk  (07/29/2023)   Overall Financial Resource Strain (CARDIA)    Difficulty of Paying Living Expenses: Not hard at all  Food Insecurity: No Food Insecurity (07/29/2023)   Hunger Vital Sign    Worried About Running Out of Food in the Last Year: Never true    Ran Out of Food in the Last Year: Never true  Transportation Needs: No Transportation Needs (07/29/2023)   PRAPARE - Administrator, Civil Service (Medical): No    Lack of Transportation (Non-Medical): No  Physical Activity: Insufficiently Active (07/29/2023)   Exercise Vital Sign    Days of Exercise per Week: 3 days    Minutes of Exercise per Session: 40 min  Stress: No Stress Concern Present (07/29/2023)   [employer] of Occupational Health - Occupational Stress Questionnaire    Feeling of Stress : Not at all  Social Connections: Moderately Integrated (07/29/2023)   Social Connection and Isolation Panel    Frequency of Communication with Friends and Family: More than three times a week    Frequency of Social Gatherings with Friends and Family: Three times a week    Attends Religious Services: More than 4  times per year    Active Member of Clubs or Organizations: Yes    Attends Banker Meetings: More than 4 times per year    Marital Status: Widowed     Review of Systems  Constitutional:  Negative for appetite change and unexpected weight change.  HENT:  Negative for congestion and sinus pressure.   Respiratory:  Negative for cough, chest tightness and shortness of breath.   Cardiovascular:  Negative for chest pain, palpitations and leg swelling.  Gastrointestinal:  Negative for abdominal pain, diarrhea, nausea and vomiting.  Genitourinary:  Negative for difficulty urinating and dysuria.  Musculoskeletal:  Negative for joint swelling and myalgias.  Skin:  Negative for color change and rash.  Neurological:  Negative for dizziness and headaches.  Psychiatric/Behavioral:  Negative for agitation  and dysphoric mood.        Objective:     BP 120/70   Pulse 64   Resp 16   Ht 5' 4 (1.626 m)   Wt 142 lb 3.2 oz (64.5 kg)   SpO2 98%   BMI 24.41 kg/m  Wt Readings from Last 3 Encounters:  04/10/24 142 lb 3.2 oz (64.5 kg)  09/24/23 153 lb 3.2 oz (69.5 kg)  08/01/23 154 lb (69.9 kg)    Physical Exam Vitals reviewed.  Constitutional:      General: She is not in acute distress.    Appearance: Normal appearance.  HENT:     Head: Normocephalic and atraumatic.     Right Ear: External ear normal.     Left Ear: External ear normal.     Mouth/Throat:     Pharynx: No oropharyngeal exudate or posterior oropharyngeal erythema.  Eyes:     General: No scleral icterus.       Right eye: No discharge.        Left eye: No discharge.     Conjunctiva/sclera: Conjunctivae normal.  Neck:     Thyroid : No thyromegaly.  Cardiovascular:     Rate and Rhythm: Normal rate and regular rhythm.  Pulmonary:     Effort: No respiratory distress.     Breath sounds: Normal breath sounds. No wheezing.  Abdominal:     General: Bowel sounds are normal.     Palpations: Abdomen is soft.     Tenderness: There is no abdominal tenderness.  Musculoskeletal:        General: No swelling or tenderness.     Cervical back: Neck supple. No tenderness.  Lymphadenopathy:     Cervical: No cervical adenopathy.  Skin:    Findings: No erythema or rash.  Neurological:     Mental Status: She is alert.  Psychiatric:        Mood and Affect: Mood normal.        Behavior: Behavior normal.         Outpatient Encounter Medications as of 04/10/2024  Medication Sig   amLODipine  (NORVASC ) 10 MG tablet Take 1 tablet (10 mg total) by mouth daily.   aspirin EC 81 MG tablet Take 81 mg by mouth daily.   atenolol  (TENORMIN ) 50 MG tablet TAKE 1 TABLET BY MOUTH DAILY   Azelastine  HCl 137 MCG/SPRAY SOLN SPRAY 1 SPRAY IN EACH NOSTRIL TWICE DAILY   fluocinonide  (LIDEX ) 0.05 % external solution Apply 1 Application topically  2 (two) times daily.   potassium chloride  (KLOR-CON ) 10 MEQ tablet Take 1 tablet (10 mEq total) by mouth 2 (two) times daily.   rosuvastatin  (CRESTOR ) 20 MG tablet Take 1 tablet (  20 mg total) by mouth daily.   valsartan -hydrochlorothiazide  (DIOVAN -HCT) 160-12.5 MG tablet Take 1 tablet by mouth daily.   No facility-administered encounter medications on file as of 04/10/2024.     Lab Results  Component Value Date   WBC 11.2 (H) 04/10/2024   HGB 14.5 04/10/2024   HCT 43.7 04/10/2024   PLT 316.0 04/10/2024   GLUCOSE 104 (H) 04/10/2024   CHOL 175 04/10/2024   TRIG 92.0 04/10/2024   HDL 65.30 04/10/2024   LDLDIRECT 143.3 06/11/2013   LDLCALC 92 04/10/2024   ALT 19 04/10/2024   AST 24 04/10/2024   NA 137 04/10/2024   K 4.0 04/10/2024   CL 100 04/10/2024   CREATININE 1.02 04/10/2024   BUN 13 04/10/2024   CO2 28 04/10/2024   TSH 4.47 10/23/2023   HGBA1C 5.6 04/10/2024    MM 3D SCREENING MAMMOGRAM BILATERAL BREAST Result Date: 09/10/2023 CLINICAL DATA:  Screening. EXAM: DIGITAL SCREENING BILATERAL MAMMOGRAM WITH TOMOSYNTHESIS AND CAD TECHNIQUE: Bilateral screening digital craniocaudal and mediolateral oblique mammograms were obtained. Bilateral screening digital breast tomosynthesis was performed. The images were evaluated with computer-aided detection. COMPARISON:  Previous exam(s). ACR Breast Density Category c: The breasts are heterogeneously dense, which may obscure small masses. FINDINGS: There are no findings suspicious for malignancy. IMPRESSION: No mammographic evidence of malignancy. A result letter of this screening mammogram will be mailed directly to the patient. RECOMMENDATION: Screening mammogram in one year. (Code:SM-B-01Y) BI-RADS CATEGORY  1: Negative. Electronically Signed   By: Toribio Agreste M.D.   On: 09/10/2023 09:41   DG Bone Density Result Date: 09/06/2023 EXAM: DUAL X-RAY ABSORPTIOMETRY (DXA) FOR BONE MINERAL DENSITY IMPRESSION: Dear Dr. Glendia, Your patient Cheyenne Baker  Baker completed a FRAX assessment on 09/06/2023 using the Lunar iDXA DXA System (analysis version: 14.10) manufactured by [employer]. The following summarizes the results of our evaluation. PATIENT BIOGRAPHICAL: Name: Cheyenne Baker, Cheyenne Baker Patient ID: [id number] Birth Date: July 08, 1950 Height:    64.0 in. Gender:     Female    [age]       Weight:    153.3 lbs. Ethnicity:  White                            Exam Date: 09/06/2023 FRAX* RESULTS:  (version: 3.5) 10-year Probability of Fracture1 Major Osteoporotic Fracture2 Hip Fracture 10.0% 1.6% Population: [place]  (Caucasian) Risk Factors: None Based on Femur (Left) Neck BMD 1 -The 10-year probability of fracture may be lower than reported if the patient has received treatment. 2 -Major Osteoporotic Fracture: Clinical Spine, Forearm, Hip or Shoulder *FRAX is a Armed forces logistics/support/administrative officer of the [employer] of [employer] for Metabolic Bone Disease, a World Science writer (WHO) [employer]. ASSESSMENT: The probability of a major osteoporotic fracture is 10.0% within the next ten years. The probability of a hip fracture is 1.6% within the next ten years. . Your patient Cheyenne Baker completed a BMD test on 09/06/2023 using the [employer] DXA System (software version: 14.10) manufactured by [employer]. The following summarizes the results of our evaluation. Technologist: [facility] PATIENT BIOGRAPHICAL: Name: Cheyenne Baker, Cheyenne Baker Patient ID: [id number] Birth Date: 03-31-1951 Height: 64.0 in. Gender: Female Exam Date: 09/06/2023 Weight: 153.3 lbs. Indications: Advanced Age, Caucasian, Height Loss, Hysterectomy, Postmenopausal Fractures: Treatments: DENSITOMETRY RESULTS: Site      Region     Measured Date Measured Age WHO Classification Young Adult T-score BMD         %  Change vs. Previous Significant Change (*) AP Spine L1-L2 09/06/2023 75.0 Normal 0.1 1.185 g/cm2 9.9% Yes AP Spine L1-L2 10/02/2016 68.1 Normal -0.8 1.078 g/cm2 8.1% Yes AP Spine L1-L2  10/16/2007 59.1 Osteopenia -1.5 0.997 g/cm2 -3.4% - AP Spine L1-L2 10/16/2007 59.1 Osteopenia -1.2 1.032 g/cm2 - - DualFemur Neck Left 09/06/2023 75.0 Osteopenia -1.1 0.888 g/cm2 -3.5% - DualFemur Neck Left 10/02/2016 68.1 Normal -0.8 0.920 g/cm2 7.4% Yes DualFemur Neck Left 10/16/2007 59.1 Osteopenia -1.3 0.857 g/cm2 1.2% - DualFemur Neck Left 10/16/2007 59.1 Osteopenia -1.4 0.847 g/cm2 - - DualFemur Total Mean 09/06/2023 75.0 Normal -0.5 0.948 g/cm2 -0.5% - DualFemur Total Mean 10/02/2016 68.1 Normal -0.4 0.953 g/cm2 2.9% Yes DualFemur Total Mean 10/16/2007 59.1 Normal -0.6 0.926 g/cm2 -0.2% - DualFemur Total Mean 10/16/2007 59.1 Normal -0.6 0.928 g/cm2 - - ASSESSMENT: The BMD measured at Femur Neck Left is 0.888 g/cm2 with a T-score of -1.1. This patient is considered osteopenic according to World Health Organization [facility]) criteria. The scan quality is good. L-3 & 4 was excluded due to degenerative changes. Compared with prior study, there has been significant increase in the spine. Compared with prior study, there has been no significant change in the total hip. World Science writer [facility]) criteria for post-menopausal, Caucasian Women: Normal:                   T-score at or above -1 SD Osteopenia/low bone mass: T-score between -1 and -2.5 SD Osteoporosis:             T-score at or below -2.5 SD RECOMMENDATIONS: 1. All patients should optimize calcium  and vitamin D intake. 2. Consider FDA-approved medical therapies in postmenopausal women and men aged 36 years and older, based on the following: a. A hip or vertebral(clinical or morphometric) fracture b. T-score < -2.5 at the femoral neck or spine after appropriate evaluation to exclude secondary causes c. Low bone mass (T-score between -1.0 and -2.5 at the femoral neck or spine) and a 10-year probability of a hip fracture > 3% or a 10-year probability of a major osteoporosis-related fracture > 20% based on the [place] -adapted WHO algorithm 3. Clinician judgment  and/or patient preferences may indicate treatment for people with 10-year fracture probabilities above or below these levels FOLLOW-UP: People with diagnosed cases of osteoporosis or at high risk for fracture should have regular bone mineral density tests. For patients eligible for Medicare, routine testing is allowed once every 2 years. The testing frequency can be increased to one year for patients who have rapidly progressing disease, those who are receiving or discontinuing medical therapy to restore bone mass, or have additional risk factors. I have reviewed this report, and agree with the above findings. [facility] Electronically Signed   By: Dina  Arceo M.D.   On: 09/06/2023 09:49       Assessment & Plan:  Hyperglycemia Assessment & Plan: Low carb diet and exercise. Follow met b and A1c.   Orders: -     Hemoglobin A1c  Hypercholesterolemia Assessment & Plan: Continue crestor . Low cholesterol diet and exercise. Follow lipid panel.  Lab Results  Component Value Date   CHOL 175 04/10/2024   HDL 65.30 04/10/2024   LDLCALC 92 04/10/2024   LDLDIRECT 143.3 06/11/2013   TRIG 92.0 04/10/2024   CHOLHDL 3 04/10/2024     Orders: -     CBC with Differential/Platelet -     Hepatic function panel -     Lipid panel  Essential hypertension Assessment &  Plan: Blood pressure as outlined.  Continue losartan /hctz, atenolol  and amlodipine .  Follow pressures. Check metabolic panel today.   Orders: -     Basic metabolic panel with GFR  Stage 3a chronic kidney disease (HCC) Assessment & Plan: Avoid antiinflammatories.  Stay hydrated.  On losartan . Have previously discussed renal ultrasound. Desired to hold. Check met b.    Enlarged thyroid  Assessment & Plan: She is s/p right thyroid  lobectomy (9/1/123) for enlarging right thyroid  nodule.  Pathology - benign.  Recommended f/u - Dr Damian - lab recheck. .  Last seen 06/06/22 - stable. No f/u recommended. Follow tsh.     Paroxysmal tachycardia (HCC) Assessment & Plan: Continue atenolol . Stable. Follow.    Stress Assessment & Plan: Overall appears to be doing well. Follow.       Allena Hamilton, MD

## 2024-04-11 ENCOUNTER — Ambulatory Visit: Payer: Self-pay | Admitting: Internal Medicine

## 2024-04-11 ENCOUNTER — Encounter: Payer: Self-pay | Admitting: Internal Medicine

## 2024-04-11 ENCOUNTER — Other Ambulatory Visit: Payer: Self-pay | Admitting: Internal Medicine

## 2024-04-11 DIAGNOSIS — D72829 Elevated white blood cell count, unspecified: Secondary | ICD-10-CM

## 2024-04-11 DIAGNOSIS — N1831 Chronic kidney disease, stage 3a: Secondary | ICD-10-CM

## 2024-04-11 NOTE — Assessment & Plan Note (Signed)
 Continue crestor . Low cholesterol diet and exercise. Follow lipid panel.  Lab Results  Component Value Date   CHOL 175 04/10/2024   HDL 65.30 04/10/2024   LDLCALC 92 04/10/2024   LDLDIRECT 143.3 06/11/2013   TRIG 92.0 04/10/2024   CHOLHDL 3 04/10/2024

## 2024-04-11 NOTE — Assessment & Plan Note (Signed)
 She is s/p right thyroid  lobectomy (9/1/123) for enlarging right thyroid  nodule.  Pathology - benign.  Recommended f/u - Dr Damian - lab recheck. .  Last seen 06/06/22 - stable. No f/u recommended. Follow tsh.

## 2024-04-11 NOTE — Assessment & Plan Note (Signed)
 Blood pressure as outlined.  Continue losartan/hctz, atenolol and amlodipine.  Follow pressures. Check metabolic panel today.

## 2024-04-11 NOTE — Assessment & Plan Note (Signed)
 Low-carb diet and exercise.  Follow met b and A1c.

## 2024-04-11 NOTE — Assessment & Plan Note (Signed)
 Avoid antiinflammatories.  Stay hydrated.  On losartan . Have previously discussed renal ultrasound. Desired to hold. Check met b.

## 2024-04-11 NOTE — Assessment & Plan Note (Signed)
 Continue atenolol . Stable. Follow.

## 2024-04-11 NOTE — Progress Notes (Signed)
Order placed for f/u labs.  

## 2024-04-11 NOTE — Assessment & Plan Note (Signed)
 Overall appears to be doing well.  Follow.

## 2024-04-13 ENCOUNTER — Other Ambulatory Visit: Payer: Self-pay | Admitting: Internal Medicine

## 2024-04-24 DIAGNOSIS — Z23 Encounter for immunization: Secondary | ICD-10-CM | POA: Diagnosis not present

## 2024-05-04 ENCOUNTER — Other Ambulatory Visit

## 2024-05-04 DIAGNOSIS — N1831 Chronic kidney disease, stage 3a: Secondary | ICD-10-CM | POA: Diagnosis not present

## 2024-05-04 DIAGNOSIS — D72829 Elevated white blood cell count, unspecified: Secondary | ICD-10-CM | POA: Diagnosis not present

## 2024-05-04 LAB — BASIC METABOLIC PANEL WITH GFR
BUN: 14 mg/dL (ref 6–23)
CO2: 28 meq/L (ref 19–32)
Calcium: 10.4 mg/dL (ref 8.4–10.5)
Chloride: 101 meq/L (ref 96–112)
Creatinine, Ser: 1.1 mg/dL (ref 0.40–1.20)
GFR: 49.08 mL/min — ABNORMAL LOW (ref >60.00)
Glucose, Bld: 94 mg/dL (ref 70–99)
Potassium: 4.1 meq/L (ref 3.5–5.1)
Sodium: 136 meq/L (ref 135–145)

## 2024-05-04 LAB — CBC WITH DIFFERENTIAL/PLATELET
Basophils Absolute: 0 K/uL (ref 0.0–0.1)
Basophils Relative: 0.5 % (ref 0.0–3.0)
Eosinophils Absolute: 0.1 K/uL (ref 0.0–0.7)
Eosinophils Relative: 0.7 % (ref 0.0–5.0)
HCT: 40.4 % (ref 36.0–46.0)
Hemoglobin: 13.9 g/dL (ref 12.0–15.0)
Lymphocytes Relative: 8 % — ABNORMAL LOW (ref 12.0–46.0)
Lymphs Abs: 0.8 K/uL (ref 0.7–4.0)
MCHC: 34.5 g/dL (ref 30.0–36.0)
MCV: 91.6 fl (ref 78.0–100.0)
Monocytes Absolute: 1 K/uL (ref 0.1–1.0)
Monocytes Relative: 9.6 % (ref 3.0–12.0)
Neutro Abs: 8.2 K/uL — ABNORMAL HIGH (ref 1.4–7.7)
Neutrophils Relative %: 81.2 % — ABNORMAL HIGH (ref 43.0–77.0)
Platelets: 332 K/uL (ref 150.0–400.0)
RBC: 4.41 Mil/uL (ref 3.87–5.11)
RDW: 13.5 % (ref 11.5–15.5)
WBC: 10.1 K/uL (ref 4.0–10.5)

## 2024-05-07 ENCOUNTER — Ambulatory Visit: Payer: Self-pay | Admitting: Internal Medicine

## 2024-05-07 ENCOUNTER — Other Ambulatory Visit: Payer: Self-pay | Admitting: Internal Medicine

## 2024-05-07 DIAGNOSIS — R944 Abnormal results of kidney function studies: Secondary | ICD-10-CM

## 2024-05-07 NOTE — Progress Notes (Signed)
Order placed for f/u labs.  

## 2024-05-15 ENCOUNTER — Other Ambulatory Visit: Payer: Self-pay | Admitting: Internal Medicine

## 2024-06-01 ENCOUNTER — Other Ambulatory Visit

## 2024-06-10 ENCOUNTER — Other Ambulatory Visit

## 2024-06-10 DIAGNOSIS — R944 Abnormal results of kidney function studies: Secondary | ICD-10-CM

## 2024-06-10 LAB — BASIC METABOLIC PANEL WITH GFR
BUN: 14 mg/dL (ref 6–23)
CO2: 30 meq/L (ref 19–32)
Calcium: 10.5 mg/dL (ref 8.4–10.5)
Chloride: 100 meq/L (ref 96–112)
Creatinine, Ser: 0.99 mg/dL (ref 0.40–1.20)
GFR: 55.66 mL/min — ABNORMAL LOW (ref >60.00)
Glucose, Bld: 94 mg/dL (ref 70–99)
Potassium: 4.5 meq/L (ref 3.5–5.1)
Sodium: 137 meq/L (ref 135–145)

## 2024-06-10 LAB — URINALYSIS, ROUTINE W REFLEX MICROSCOPIC
Bilirubin Urine: NEGATIVE
Hgb urine dipstick: NEGATIVE
Ketones, ur: NEGATIVE
Leukocytes,Ua: NEGATIVE
Nitrite: NEGATIVE
RBC / HPF: NONE SEEN (ref 0–?)
Specific Gravity, Urine: 1.01 (ref 1.000–1.030)
Total Protein, Urine: NEGATIVE
Urine Glucose: NEGATIVE
Urobilinogen, UA: 0.2 (ref 0.0–1.0)
WBC, UA: NONE SEEN (ref 0–?)
pH: 6.5 (ref 5.0–8.0)

## 2024-06-11 ENCOUNTER — Ambulatory Visit: Payer: Self-pay | Admitting: Internal Medicine

## 2024-07-25 ENCOUNTER — Other Ambulatory Visit: Payer: Self-pay | Admitting: Internal Medicine

## 2024-07-31 ENCOUNTER — Other Ambulatory Visit: Payer: Self-pay | Admitting: Internal Medicine

## 2024-08-03 ENCOUNTER — Ambulatory Visit: Payer: Medicare Other
